# Patient Record
Sex: Female | Born: 1935 | Race: White | Hispanic: No | Smoking: Former smoker
Health system: Southern US, Community
[De-identification: ages and names within clinical notes are randomized; demographics above are authoritative.]

## PROBLEM LIST (undated history)

## (undated) DIAGNOSIS — E559 Vitamin D deficiency, unspecified: Secondary | ICD-10-CM

## (undated) DIAGNOSIS — D4959 Neoplasm of unspecified behavior of other genitourinary organ: Secondary | ICD-10-CM

## (undated) DIAGNOSIS — R112 Nausea with vomiting, unspecified: Secondary | ICD-10-CM

## (undated) DIAGNOSIS — Z9889 Other specified postprocedural states: Secondary | ICD-10-CM

## (undated) DIAGNOSIS — T4145XA Adverse effect of unspecified anesthetic, initial encounter: Secondary | ICD-10-CM

## (undated) DIAGNOSIS — M810 Age-related osteoporosis without current pathological fracture: Secondary | ICD-10-CM

## (undated) DIAGNOSIS — D493 Neoplasm of unspecified behavior of breast: Secondary | ICD-10-CM

## (undated) DIAGNOSIS — T8859XA Other complications of anesthesia, initial encounter: Secondary | ICD-10-CM

## (undated) DIAGNOSIS — I219 Acute myocardial infarction, unspecified: Secondary | ICD-10-CM

## (undated) DIAGNOSIS — D491 Neoplasm of unspecified behavior of respiratory system: Secondary | ICD-10-CM

## (undated) DIAGNOSIS — I2510 Atherosclerotic heart disease of native coronary artery without angina pectoris: Secondary | ICD-10-CM

## (undated) DIAGNOSIS — I10 Essential (primary) hypertension: Secondary | ICD-10-CM

## (undated) DIAGNOSIS — E785 Hyperlipidemia, unspecified: Secondary | ICD-10-CM

## (undated) HISTORY — PX: HIP PINNING: SHX1757

## (undated) HISTORY — DX: Vitamin D deficiency, unspecified: E55.9

## (undated) HISTORY — DX: Hyperlipidemia, unspecified: E78.5

## (undated) HISTORY — DX: Hypercalcemia: E83.52

## (undated) HISTORY — DX: Atherosclerotic heart disease of native coronary artery without angina pectoris: I25.10

## (undated) HISTORY — PX: CHOLECYSTECTOMY: SHX55

## (undated) HISTORY — PX: EYE SURGERY: SHX253

## (undated) HISTORY — PX: ANGIOPLASTY: SHX39

## (undated) HISTORY — DX: Essential (primary) hypertension: I10

## (undated) HISTORY — PX: CARDIAC CATHETERIZATION: SHX172

## (undated) HISTORY — PX: BREAST LUMPECTOMY: SHX2

## (undated) HISTORY — DX: Neoplasm of unspecified behavior of breast: D49.3

## (undated) HISTORY — DX: Acute myocardial infarction, unspecified: I21.9

## (undated) HISTORY — DX: Neoplasm of unspecified behavior of other genitourinary organ: D49.59

## (undated) HISTORY — DX: Age-related osteoporosis without current pathological fracture: M81.0

## (undated) HISTORY — PX: ABDOMINAL HYSTERECTOMY: SHX81

---

## 1993-12-25 DIAGNOSIS — I219 Acute myocardial infarction, unspecified: Secondary | ICD-10-CM

## 1993-12-25 HISTORY — DX: Acute myocardial infarction, unspecified: I21.9

## 1997-11-04 ENCOUNTER — Other Ambulatory Visit: Admission: RE | Admit: 1997-11-04 | Discharge: 1997-11-04 | Payer: Self-pay | Admitting: Obstetrics and Gynecology

## 1997-11-18 ENCOUNTER — Ambulatory Visit (HOSPITAL_COMMUNITY): Admission: RE | Admit: 1997-11-18 | Discharge: 1997-11-18 | Payer: Self-pay | Admitting: Obstetrics and Gynecology

## 1997-11-20 ENCOUNTER — Ambulatory Visit (HOSPITAL_COMMUNITY): Admission: RE | Admit: 1997-11-20 | Discharge: 1997-11-20 | Payer: Self-pay | Admitting: Obstetrics and Gynecology

## 1998-11-05 ENCOUNTER — Other Ambulatory Visit: Admission: RE | Admit: 1998-11-05 | Discharge: 1998-11-05 | Payer: Self-pay | Admitting: Obstetrics and Gynecology

## 1998-11-24 ENCOUNTER — Ambulatory Visit (HOSPITAL_COMMUNITY): Admission: RE | Admit: 1998-11-24 | Discharge: 1998-11-24 | Payer: Self-pay | Admitting: Obstetrics and Gynecology

## 1998-11-24 ENCOUNTER — Encounter: Payer: Self-pay | Admitting: Obstetrics and Gynecology

## 1998-12-31 ENCOUNTER — Inpatient Hospital Stay (HOSPITAL_COMMUNITY): Admission: EM | Admit: 1998-12-31 | Discharge: 1999-01-03 | Payer: Self-pay | Admitting: *Deleted

## 1999-01-01 ENCOUNTER — Encounter: Payer: Self-pay | Admitting: Neurology

## 1999-04-15 ENCOUNTER — Encounter: Payer: Self-pay | Admitting: Neurology

## 1999-04-15 ENCOUNTER — Ambulatory Visit (HOSPITAL_COMMUNITY): Admission: RE | Admit: 1999-04-15 | Discharge: 1999-04-15 | Payer: Self-pay | Admitting: Neurology

## 1999-11-30 ENCOUNTER — Encounter: Payer: Self-pay | Admitting: Obstetrics and Gynecology

## 1999-11-30 ENCOUNTER — Ambulatory Visit (HOSPITAL_COMMUNITY): Admission: RE | Admit: 1999-11-30 | Discharge: 1999-11-30 | Payer: Self-pay | Admitting: Obstetrics and Gynecology

## 1999-12-22 ENCOUNTER — Other Ambulatory Visit: Admission: RE | Admit: 1999-12-22 | Discharge: 1999-12-22 | Payer: Self-pay | Admitting: Obstetrics and Gynecology

## 2000-03-03 ENCOUNTER — Ambulatory Visit (HOSPITAL_COMMUNITY): Admission: RE | Admit: 2000-03-03 | Discharge: 2000-03-03 | Payer: Self-pay | Admitting: Gastroenterology

## 2001-02-22 ENCOUNTER — Encounter (INDEPENDENT_AMBULATORY_CARE_PROVIDER_SITE_OTHER): Payer: Self-pay

## 2001-02-22 ENCOUNTER — Ambulatory Visit (HOSPITAL_COMMUNITY): Admission: RE | Admit: 2001-02-22 | Discharge: 2001-02-22 | Payer: Self-pay | Admitting: Gastroenterology

## 2001-02-23 ENCOUNTER — Ambulatory Visit (HOSPITAL_COMMUNITY): Admission: RE | Admit: 2001-02-23 | Discharge: 2001-02-23 | Payer: Self-pay | Admitting: Gastroenterology

## 2001-02-23 ENCOUNTER — Encounter: Payer: Self-pay | Admitting: Gastroenterology

## 2001-10-24 ENCOUNTER — Ambulatory Visit (HOSPITAL_COMMUNITY): Admission: RE | Admit: 2001-10-24 | Discharge: 2001-10-24 | Payer: Self-pay | Admitting: Gastroenterology

## 2002-03-04 ENCOUNTER — Other Ambulatory Visit: Admission: RE | Admit: 2002-03-04 | Discharge: 2002-03-04 | Payer: Self-pay | Admitting: Obstetrics and Gynecology

## 2002-03-22 ENCOUNTER — Ambulatory Visit (HOSPITAL_COMMUNITY): Admission: RE | Admit: 2002-03-22 | Discharge: 2002-03-22 | Payer: Self-pay | Admitting: Obstetrics and Gynecology

## 2002-03-22 ENCOUNTER — Encounter: Payer: Self-pay | Admitting: Obstetrics and Gynecology

## 2002-04-27 HISTORY — PX: FRACTURE SURGERY: SHX138

## 2002-05-14 ENCOUNTER — Encounter: Payer: Self-pay | Admitting: Orthopedic Surgery

## 2002-05-14 ENCOUNTER — Encounter: Admission: RE | Admit: 2002-05-14 | Discharge: 2002-05-14 | Payer: Self-pay | Admitting: Orthopedic Surgery

## 2002-05-15 ENCOUNTER — Ambulatory Visit (HOSPITAL_BASED_OUTPATIENT_CLINIC_OR_DEPARTMENT_OTHER): Admission: RE | Admit: 2002-05-15 | Discharge: 2002-05-15 | Payer: Self-pay | Admitting: Orthopedic Surgery

## 2003-06-06 ENCOUNTER — Ambulatory Visit (HOSPITAL_COMMUNITY): Admission: RE | Admit: 2003-06-06 | Discharge: 2003-06-06 | Payer: Self-pay | Admitting: Obstetrics and Gynecology

## 2005-09-02 ENCOUNTER — Ambulatory Visit (HOSPITAL_COMMUNITY): Admission: RE | Admit: 2005-09-02 | Discharge: 2005-09-02 | Payer: Self-pay | Admitting: Obstetrics and Gynecology

## 2005-10-05 ENCOUNTER — Encounter: Admission: RE | Admit: 2005-10-05 | Discharge: 2005-10-05 | Payer: Self-pay | Admitting: Obstetrics and Gynecology

## 2005-10-13 ENCOUNTER — Other Ambulatory Visit: Admission: RE | Admit: 2005-10-13 | Discharge: 2005-10-13 | Payer: Self-pay | Admitting: Obstetrics and Gynecology

## 2005-11-24 ENCOUNTER — Encounter: Admission: RE | Admit: 2005-11-24 | Discharge: 2005-11-24 | Payer: Self-pay | Admitting: Obstetrics and Gynecology

## 2006-06-26 ENCOUNTER — Inpatient Hospital Stay (HOSPITAL_COMMUNITY): Admission: EM | Admit: 2006-06-26 | Discharge: 2006-06-30 | Payer: Self-pay | Admitting: Emergency Medicine

## 2006-06-30 ENCOUNTER — Ambulatory Visit: Payer: Self-pay | Admitting: Physical Medicine & Rehabilitation

## 2008-06-27 HISTORY — PX: DOPPLER ECHOCARDIOGRAPHY: SHX263

## 2008-11-29 ENCOUNTER — Inpatient Hospital Stay (HOSPITAL_COMMUNITY): Admission: EM | Admit: 2008-11-29 | Discharge: 2008-12-03 | Payer: Self-pay | Admitting: Emergency Medicine

## 2009-01-30 ENCOUNTER — Encounter: Admission: RE | Admit: 2009-01-30 | Discharge: 2009-01-30 | Payer: Self-pay | Admitting: Internal Medicine

## 2010-02-01 ENCOUNTER — Encounter: Admission: RE | Admit: 2010-02-01 | Discharge: 2010-02-01 | Payer: Self-pay | Admitting: Internal Medicine

## 2010-07-18 ENCOUNTER — Encounter: Payer: Self-pay | Admitting: Internal Medicine

## 2010-10-04 LAB — BASIC METABOLIC PANEL
BUN: 15 mg/dL (ref 6–23)
BUN: 7 mg/dL (ref 6–23)
CO2: 23 mEq/L (ref 19–32)
CO2: 29 mEq/L (ref 19–32)
CO2: 30 mEq/L (ref 19–32)
Calcium: 9.1 mg/dL (ref 8.4–10.5)
Calcium: 9.2 mg/dL (ref 8.4–10.5)
Calcium: 9.9 mg/dL (ref 8.4–10.5)
Chloride: 107 mEq/L (ref 96–112)
Chloride: 107 mEq/L (ref 96–112)
Chloride: 108 mEq/L (ref 96–112)
Creatinine, Ser: 0.82 mg/dL (ref 0.4–1.2)
Creatinine, Ser: 0.87 mg/dL (ref 0.4–1.2)
Creatinine, Ser: 0.9 mg/dL (ref 0.4–1.2)
Creatinine, Ser: 0.93 mg/dL (ref 0.4–1.2)
GFR calc Af Amer: >60 mL/min (ref >60)
GFR calc Af Amer: >60 mL/min (ref >60)
GFR calc Af Amer: >60 mL/min (ref >60)
GFR calc Af Amer: >60 mL/min (ref >60)
GFR calc non Af Amer: >60 mL/min (ref >60)
GFR calc non Af Amer: >60 mL/min (ref >60)
Glucose, Bld: 100 mg/dL — ABNORMAL HIGH (ref 70–99)
Glucose, Bld: 96 mg/dL (ref 70–99)
Potassium: 4 mEq/L (ref 3.5–5.1)
Sodium: 137 mEq/L (ref 135–145)
Sodium: 140 mEq/L (ref 135–145)
Sodium: 141 mEq/L (ref 135–145)

## 2010-10-04 LAB — DIFFERENTIAL
Basophils Absolute: 0 10*3/uL (ref 0.0–0.1)
Basophils Relative: 0 % (ref 0–1)
Eosinophils Absolute: 0 10*3/uL (ref 0.0–0.7)
Eosinophils Relative: 0 % (ref 0–5)
Lymphocytes Relative: 9 % — ABNORMAL LOW (ref 12–46)
Lymphs Abs: 1.1 10*3/uL (ref 0.7–4.0)
Monocytes Absolute: 0.4 10*3/uL (ref 0.1–1.0)
Monocytes Relative: 3 % (ref 3–12)
Neutro Abs: 10.3 10*3/uL — ABNORMAL HIGH (ref 1.7–7.7)
Neutrophils Relative %: 87 % — ABNORMAL HIGH (ref 43–77)

## 2010-10-04 LAB — URINALYSIS, ROUTINE W REFLEX MICROSCOPIC
Glucose, UA: NEGATIVE mg/dL
Ketones, ur: NEGATIVE mg/dL
Protein, ur: 30 mg/dL — AB
Urobilinogen, UA: 0.2 mg/dL (ref 0.0–1.0)

## 2010-10-04 LAB — CBC
HCT: 42.9 % (ref 36.0–46.0)
Hemoglobin: 10.2 g/dL — ABNORMAL LOW (ref 12.0–15.0)
Hemoglobin: 10.4 g/dL — ABNORMAL LOW (ref 12.0–15.0)
Hemoglobin: 13.9 g/dL (ref 12.0–15.0)
MCHC: 32.3 g/dL (ref 30.0–36.0)
MCHC: 33.6 g/dL (ref 30.0–36.0)
MCHC: 33.7 g/dL (ref 30.0–36.0)
MCV: 83 fL (ref 78.0–100.0)
MCV: 83 fL (ref 78.0–100.0)
MCV: 83.4 fL (ref 78.0–100.0)
Platelets: 176 10*3/uL (ref 150–400)
Platelets: 246 10*3/uL (ref 150–400)
RBC: 3.66 MIL/uL — ABNORMAL LOW (ref 3.87–5.11)
RBC: 3.71 MIL/uL — ABNORMAL LOW (ref 3.87–5.11)
RBC: 4.13 MIL/uL (ref 3.87–5.11)
RBC: 5.16 MIL/uL — ABNORMAL HIGH (ref 3.87–5.11)
RDW: 16.7 % — ABNORMAL HIGH (ref 11.5–15.5)
RDW: 17.1 % — ABNORMAL HIGH (ref 11.5–15.5)
WBC: 11.8 10*3/uL — ABNORMAL HIGH (ref 4.0–10.5)
WBC: 7.9 10*3/uL (ref 4.0–10.5)
WBC: 8.7 10*3/uL (ref 4.0–10.5)

## 2010-10-04 LAB — URINE MICROSCOPIC-ADD ON

## 2010-10-04 LAB — ETHANOL

## 2010-10-04 LAB — RAPID URINE DRUG SCREEN, HOSP PERFORMED
Barbiturates: NOT DETECTED
Benzodiazepines: NOT DETECTED
Cocaine: NOT DETECTED

## 2010-11-09 NOTE — Op Note (Signed)
[name], [name]            ACCOUNT NO.:  [id number]   MEDICAL RECORD NO.:  [id number]          PATIENT TYPE:  INP   LOCATION:  1530                         FACILITY:  [facility]   PHYSICIAN:  Lubertha Basque. Dalldorf, M.D.DATE OF BIRTH:  1934/09/08   DATE OF PROCEDURE:  11/30/2008  DATE OF DISCHARGE:                               OPERATIVE REPORT   PREOPERATIVE DIAGNOSIS:  Left distal femur fracture.   POSTOPERATIVE DIAGNOSIS:  Left distal femur fracture.   PROCEDURE:  Open reduction internal fixation, left distal femur  fracture.   ANESTHESIA:  General.   ATTENDING SURGEON:  Lubertha Basque. Jerl Santos, M.D.   ASSISTANT:  Lindwood Qua, P.A.   INDICATIONS FOR PROCEDURE:  The patient is a 76 year old woman well-  known to our practice from old fractures.  Unfortunately she was at home  yesterday when she slipped when trying to climb and landed on her left  leg.  She suffered a displaced distal femur fracture.  She is offered  ORIF.  Informed operative consent was obtained after discussion of  possible complications including reaction to anesthesia, infection, DVT,  and death.  The probability of arthritis due to this intra-articular  fracture was also discussed with the patient.   SUMMARY OF FINDINGS AND PROCEDURE:  Under general anesthesia through a  lateral approach, a left distal femur fracture was reduced and then  stabilized with a POLYAX plate by DePuy.  I used fluoroscopy throughout  the case to make appropriate intraoperative decisions read all these  views myself.  Bryna Colander assisted throughout mostly in that he  helped maintain reduction while I placed the hardware.  He also closed  simultaneously to help minimize OR time.   DESCRIPTION OF PROCEDURE:  The patient was taken to the operating suite  where general anesthetic applied without difficulty.  She was positioned  supine and prepped and draped in normal sterile fashion.  After the  administration of IV Kefzol, the  left leg was elevated, exsanguinated,  tourniquet inflated out the thigh.  An anterolateral incision was made  with dissection down through the IT band.  A large hemarthrosis was  evacuated from the knee.  I then reduced her lateral condyle piece to  the rest of the femur with some elevators and Bryna Colander passed a K-  wire securing this in place as temporary fixation.  We then placed the  six-hole POLYAX  plate appropriately.  This was provisionally fixed to  the bone with another K-wire and a proximal locking device.  We then  placed 5 screws in the distal portion, the first of which was a  compression screw and the rest of which were locking.  We then filled 4  proximal screws with the attached guide, placing 3 nonlocking and 1  locking screw.  I used fluoroscopy to confirm adequate placement of  hardware and reduction of fractures.  The wound was irrigated followed  by release of the tourniquet.  A small amount of bleeding was easily  controlled some pressure and Bovie cautery.  IT band was reapproximated  with #1 Vicryl in interrupted fashion followed by subcutaneous  reapproximation with 0 and 2-0 undyed Vicryl and skin closure with  staples.  Adaptic was applied followed by dry gauze and loose Ace wrap.  Estimated blood loss and fluids obtained from anesthesia records as can  accurate tourniquet time.   DISPOSITION:  The patient was extubated in the operating room and taken  to recovery in stable addition.  She was be admitted back to the  orthopedic surgery service for appropriate postop care to include  perioperative antibiotics and Lovenox for DVT prophylaxis.      Lubertha Basque Jerl Santos, M.D.  Electronically Signed     PGD/MEDQ  D:  11/30/2008  T:  12/01/2008  Job:  [id number]

## 2010-11-09 NOTE — Discharge Summary (Signed)
[name], [name]            ACCOUNT NO.:  [id number]   MEDICAL RECORD NO.:  [id number]          PATIENT TYPE:  INP   LOCATION:  1614                         FACILITY:  [facility]   PHYSICIAN:  Lubertha Basque. Dalldorf, M.D.DATE OF BIRTH:  1934-11-06   DATE OF ADMISSION:  11/29/2008  DATE OF DISCHARGE:  12/03/2008                               DISCHARGE SUMMARY   ADMITTING DIAGNOSES:  1. Left distal femur fracture.  2. Hypercholesterolemia.  3. Hypertension.  4. Osteoporosis.   DISCHARGE DIAGNOSES:  1. Left distal femur fracture.  2. Hypercholesterolemia.  3. Hypertension.  4. Osteoporosis.  5. Psychosis.   BRIEF HISTORY:  [name] is a patient well-known to our practice.  She is a 76 year old white female who the day of admission to the  hospital was on a stepstool attempting to pull down the ladder to her  attic.  She lost her balance and fell and was unable to stand and walk  and was transported to [facility] emergency room at which time  x-rays were taken and she had a comminuted left distal femur fracture.  We consulted on the patient and we told her that the best treatment for  this was operative - open reduction internal fixation and we would  proceed on with that as soon as OR time was available.   PERTINENT LABORATORY AND X-RAY FINDINGS:  Hemoglobin 10.2, hematocrit  30.3, WBC 6.2, platelets 175.  Sodium 141, potassium 4.1, BUN 10,  creatinine 0.87, glucose 100, calcium 9.2.  X-rays, there was a brain  MRI scan done because the patient had had episodes of hearing voices and  delusions or hallucinations and this was performed while she was in the  hospital and the impression was a vascular benign lesion in the fourth  ventricle essentially unchanged from a previous MRI scan.  Left knee x-  rays showed ORIF of left distal femur fracture.  Portable chest no  active disease, some suggestion of COPD.  Initial knee x-rays distal  femur fracture with lateral  condyle as a free fragment.   HOSPITAL COURSE:  The patient was admitted from the emergency room,  stabilized and made comfortable on the floor.  The next morning she was  taken to the operating room for the above mentioned operation of open  reduction internal fixation of her left femur.  Postoperatively she was  kept on her home medicines which will be outlined at the end of the  dictation; IV Ancef a gram q.8 h x3 doses.  Foley catheter as needed and  then discontinued.  IV Ancef a gram q.8 h x3 doses and she was kept on  Lovenox for DVT prophylaxis but because of fall risk it was thought that  she would not be a good Coumadin long-term anticoagulation candidate.  The first day postop her vital signs were stable.  Blood pressure  136/73, heart rate 79, respirations 16, temperature 98.5.  Lungs were  clear.  Abdomen was soft.  Foley catheter was discontinued.  No dressing  drainage.  Normal neurovascular status to her lower extremities both  sides.  Physical therapy was ordered  for out of bed and could be just  touchdown to nonweightbearing with a brace on her left lower extremity.  The second day postop her vital signs remained stable.  Her dressing was  changed.  Her wound was benign.  Lungs were clear.  Abdomen was soft.  Voiding and eating well.  Dr. Jeanie Sewer performed a psych consult for [place]  and made some suggestions for this nice patient.  It was felt that she  could not live alone at home initially in the postoperative phase and  thought it would be best to find her a skilled nursing facility.   CONDITION ON DISCHARGE:  Improved.   FOLLOWUP:  She will remain on a low-sodium and heart-healthy diet.  May  change the dressing on her leg every other day.  Any sign of infection  which would be increasing redness, drainage or pain to call our office.  She could ambulate with assistance from physical therapy with the knee  brace on and no weight on her left lower extremity using a  walker,  crutches or wheelchair.  We wanted to see her back in our office at 2  weeks from the surgery for suture removal.  She will keep the brace on  in the meantime and take it off for slight bouts if she is in bed to  move her ankle and calf around but if she is up and moving at all the  knee brace needs to be on.  Physical therapy would work with her and  train her gait training for touchdown to nonweightbearing on the left  side.  She will be on a regular diet.   MEDICATION LIST:  1. Will contain Claritin 10 mg one a day.  2. Colace 100 mg b.i.d.  3. ASA 81 mg one per day.  4. Multivitamin with minerals one a day.  5. Os-Cal 500 with vitamin D 2 pills per day.  6. Pravachol 40 mg one a day.  7. Senokot tablets as needed.  8. Seroquel 25 mg p.o. q.1800 hours.  9. Toprol XL 75 mg one a day.  10.Trinsicon Foltrin cap which is an iron capsule 1 pill b.i.d.  11.Robaxin 500 one p.o. q.8 p.r.n. spasm.  12.Tylenol 325 one q.4-6 p.r.n. temperature elevation or mild pain.      Lindwood Qua, P.A.      Lubertha Basque Jerl Santos, M.D.  Electronically Signed    MC/MEDQ  D:  12/03/2008  T:  12/03/2008  Job:  [id number]

## 2010-11-09 NOTE — Consult Note (Signed)
[name]            ACCOUNT NO.:  [id number]   MEDICAL RECORD NO.:  [id number]          PATIENT TYPE:  INP   LOCATION:  1614                         FACILITY:  [facility]   PHYSICIAN:  Antonietta Breach, M.D.  DATE OF BIRTH:  07/13/1934   DATE OF CONSULTATION:  12/03/2008  DATE OF DISCHARGE:                                 CONSULTATION   REASON FOR CONSULTATION:  Psychosis.   HISTORY OF PRESENT ILLNESS:  Ms. [name] is a 76 year old  female admitted to the [facility] on November 29, 2008 due to a  fracture of the distal left femur.  She is postoperative.   She has been experiencing delusions for approximately 1 year that  involve people in her attic who are not actually there.  She believes  that they are coming down to her bedroom at night and disturbing her.  She also believes that they have sabotaged her burglary detector system.  She has called 9-1-1 multiple times.  The police have contacted Ms.  [name]'s daughter to explain that there is no data or evidence that  any of these events are occurring or that there are people in her attic.  In addition to the delusions she does have auditory hallucinations of  their cell phones going off and of them coughing.   She does have much feeling on edge and muscle tension as well as  insomnia with these delusions.  However, she does have intact interests  and constructive future goals.   Her orientation and memory function are intact.  She does not have any  clouding of consciousness or disorganization of thought process.   She has not undergone any treatment for her anxiety or psychosis.   PAST PSYCHIATRIC HISTORY:  She does not have any history of prior  psychosis.  However, she does have a history of excessive worry and  feeling on edge.   FAMILY PSYCHIATRIC HISTORY:  None known.   SOCIAL HISTORY:  Ms. [name] is chronically stressed by a riff in the  family that occurred between her and her brother  over an inheritance  that the patient received.   The patient is originally from near [place], [place].  She has  one sibling.  She is now widowed and lives alone.  She does not use  alcohol or illegal drugs.  Occupation retired.  She has one child, a  daughter who lives across town but looks in on her frequently.  Her  daughter has been contacted by the police multiple times regarding the  patient's phone calls that have been secondary to the patient's  delusional system.   The daughter has made a number of efforts to get Ms. [name] evaluated  psychiatrically that have failed.   PAST MEDICAL HISTORY:  Fracture of left distal femur status post  surgery.  She fell from a step stool.   ALLERGIES:  CODEINE AND SULFA.  She does not take any outpatient  psychotropic medications.   She does have a benign lesion in the brain stem located in the region of  the fourth ventricle.  This has been assessed to  be a possible vascular  malformation or a benign tumor of some nature.  It is being monitored  with an MRI recently showing the lesion.   She does have a history of osteoporosis, hypercholesterolemia,  hypertension.  She also has suffered from a left humerus fracture and  left fibular fracture and a left ankle fracture in the past.   LABORATORY DATA:  Hemoglobin 10.2.  EKG; QTC 420 milliseconds.   INR, alcohol, basic metabolic panel, urine drug screen all unremarkable.   REVIEW OF SYSTEMS:  CONSTITUTIONAL:  HEAD, EYES, EARS, NOSE AND THROAT:  MOUTH, NEUROLOGIC, PSYCHIATRIC, CARDIOVASCULAR, RESPIRATORY,  GASTROINTESTINAL, GENITOURINARY, SKIN, MUSCULOSKELETAL, HEMATOLOGIC,  LYMPHATIC, ENDOCRINE, METABOLIC:  All unremarkable.   PHYSICAL EXAMINATION:  VITAL SIGNS:  Temperature 98.1, pulse 84,  respiratory rate 20, blood pressure 166/79, O2 saturation on room air  92%.  GENERAL APPEARANCE:  Ms. [name] is an elderly female sitting up in  her hospital chair with no abnormal  involuntary movements.   MENTAL STATUS EXAM:  Ms. [name] is alert.  Her eye contact is good.  Her attention span is normal.  Her concentration is normal, affect is  anxious.  Her mood is anxious.  She is oriented completely to all  spheres.  Her memory function is intact to immediate, recent and remote.  Specific memory testing:  3/3 words immediate, 3/3 words at 3 minutes  without prompting.  Fund of knowledge and intelligence within normal  limits.  Speech involves normal rate and prosody without dysarthria.  Thought process is logical, coherent, goal-directed.  No looseness of  associations.  Thought content; please see the history of present  illness.  She has no thoughts of harming herself or others.  There is no  evidence that she is having delusions regarding her environment in the  hospital or hallucinations while in the hospital.  Insight is poor.  Judgment is impaired regarding her delusions.  However, her judgment  appears to be intact regarding her ability to take care of her ADLs.   ASSESSMENT:  AXIS I:  293.81 psychotic disorder not otherwise specified.  AXIS II:  None.  AXIS III:  See past medical history.  AXIS IV:  General medical.  AXIS V:  45.   The undersigned provided ego supportive psychotherapy and education.   The undersigned also met individually with the patient's daughter.  The  indications, alternatives and adverse effects of Seroquel were discussed  including the risk of cardiac death, a nonreversible movement disorder,  hyperglycemia, metabolic syndrome and falls.  She understands and wants  to proceed with the Seroquel.   Would start the Seroquel at 25 mg q.1800 with 25 mg available q.h.s.  p.r.n. insomnia.  Would then titrate the Seroquel by 25 mg per day as  tolerated to a trial dose anywhere between 50 and 150 mg q.h.s.   RECOMMENDATIONS:  Ms. [name] is going to need 24-hour per day  supervision given that the delusions and hallucinations  could result in  activities of a passive and yet lethal self-neglect.   This environment could be provided by an inpatient geropsychiatric unit  or if her daughter was able to be with her while the patient was  followed up intensively by an outpatient physician.   The latter situation would allow the patient to be more willing to  proceed with medication care since she is resistant towards seeing  mental health providers.   Would ask the social worker to facilitate setting up one of the two  discharge plans above.   The anticipated time for efficacy of the antipsychotic would be 1-3  weeks.   If the geropsychiatric inpatient unit is not acceptable to Ms. Hilton  and an outpatient environment of 24-hour per day supervision can be  obtained, ideally psychiatric outpatient follow-up would be the best.   This can also be arranged through the social worker however, as  mentioned Ms. Harmon is likely to turn this down and would be more  likely to see her primary care physician or orthopedic surgeon.   If no outpatient supervision can be obtained and Ms. Ehrich refuses  inpatient admission, she does meet the criteria for petitioning the  court for commitment due to the risk of passive and yet lethal self-  neglect due to impaired judgment.  As mentioned, she can take care of  her ADLs.  However, the psychosis could motivate her into activity that  could be dangerous.      Antonietta Breach, M.D.  Electronically Signed     JW/MEDQ  D:  12/03/2008  T:  12/03/2008  Job:  [id number]

## 2010-11-12 NOTE — Op Note (Signed)
[name], COUNTESS            ACCOUNT NO.:  [id number]   MEDICAL RECORD NO.:  [id number]          PATIENT TYPE:  INP   LOCATION:  5016                         FACILITY:  [facility]   PHYSICIAN:  Lubertha Basque. Dalldorf, M.D.DATE OF BIRTH:  Jun 20, 1935   DATE OF PROCEDURE:  06/26/2006  DATE OF DISCHARGE:                               OPERATIVE REPORT   PREOPERATIVE DIAGNOSES:  1. Left hip nondisplaced femoral neck fracture.  2. Left ankle distal fibula and tibia fractures.   POSTOPERATIVE DIAGNOSES:  1. Left hip nondisplaced femoral neck fracture.  2. Left ankle distal fibula and tibia fractures.   PROCEDURE:  1. Left hip percutaneous pinning.  2. Left ankle ORIF.   ANESTHESIA:  General.   ATTENDING SURGEON:  Marcene Corning, M.D.   ASSISTANT:  Lindwood Qua, P.A.   INDICATIONS FOR PROCEDURE:  The patient is a 76 year old woman who fell  down the steps of her home earlier today.  She was noted to have left-  sided injuries including a fracture of her shoulder, hip, knee, and  ankle.  The hip and ankle injuries required operative intervention.  She  is offered internal fixation in hopes of stabilizing these injuries so  she can at least sit and get out of bed and potentially ambulate once  again.  Informed operative consent was obtained after a discussion of  possible complications including reaction to anesthesia, infection,  neurovascular injury, and DVT.  She also understood about the serious  nature of the hip fracture in particular where she might end up with  another operation if we cannot get this fracture to heal despite  adequate pinning.  She also understood at the ankle she has a fairly  severe intra-articular problem which might require further surgery,  potentially even an ankle fusion in the future.   SUMMARY OF FINDINGS OF PROCEDURE:  Under general anesthesia several  procedures were performed.  I first performed an examination of her  shoulder under fluoroscopy  and found that her fracture there was stable  and did not require any further intervention.  I then addressed her hip  with a percutaneous pinning using two Ace screws.  One screw was 85 mm  in length and the other was 90 and these were from the DePuy set.  At  the ankle we then addressed her problem with a plate on the fibula  bringing the entire construct out to appropriate length.  This was a  locking plate and she had fair bone quality.  I did place one of the  screws all the way across to the medial aspect of the tibia.  This  seemed to stabilize the distal articular surface of the tibia.  Bryna Colander assisted throughout on both procedures and was invaluable to  completion of both cases in that he helped position and retract while I  performed the procedures.  He also closed simultaneously to help  minimize OR time.  I used fluoroscopy throughout all three portions of  this case and utilized fluoroscopy to make appropriate intraop  decisions.  A read all the views myself.   DESCRIPTION OF  PROCEDURE:  The patient was taken to the operating suite  where a general anesthetic was applied without difficulty.  She was  initially positioned in the lateral decubitus position with hip  positioners.  An axillary roll was placed and all bony prominences were  appropriately padded.  I then examined her shoulder under fluoroscopy  and this was found to be in place and moving as a the unit.  We then  prepped and draped the hip in normal sterile fashion.  After the  administration of IV Kefzol I performed some triangulation with the  fluoroscopy and made a small incision, followed by placement of two  guide wires up from the flare of the femur into the femoral head.  These  were seen to be in good position by fluoroscopy in two planes.  I then  passed an 85 and 90-mm cannulated screw by DePuy over these guidewires.  The hip was examined fluoroscopically and the pins were found to be in   appropriate position.  This remained a nondisplaced fracture throughout.  We then closed the subcutaneous tissues with 2-0 undyed Vicryl and the  skin with staples.  Adaptic was applied followed by dry gauze and tape.  We then reprepped and draped and I placed the patient in supine position  with a bump under the left hip.  The left leg was elevated,  exsanguinated, and a tourniquet inflated about the calf.  A lateral  incision was made with dissection down to the fibula.  This was brought  out to length and this also seemed to reduce the distal articular  surface of the tibia in a fairly good fashion.  I then stabilized the  fibula with a Synthes locking plate seven hole one-third tubular.  This  was bent appropriately to accommodate the shape of the distal fibula.  I  left the most proximal screw hole empty.  She had fair bone quality.  In  one of the central screws I was able to place a long fully threaded  cortical screw across to the far cortex of the tibia on the medial  aspect of the ankle.  This seemed to buttress the distal articular  surface well.  I examined the ankle under fluoroscopy and the construct  was found to be fairly stable.  The wound was irrigated followed by  reapproximation of the deep tissues with 2-0 undyed Vicryl and the skin  with staples.  Adaptic was applied followed by dry gauze.  The  tourniquet was deflated during closure and the toes became pink and warm  immediately.  A posterior splint of plaster was applied with the ankle  in neutral position.  Estimated blood loss and intraoperative fluids can  be obtained from the anesthesia records as can accurate tourniquet time.   DISPOSITION:  The patient was extubated in the operating room and taken  to the recovery room in stable addition.  She was to be admitted back to  the orthopedic surgery service for appropriate postop care to include perioperative antibiotics and Coumadin for DVT prophylaxis.       Lubertha Basque Jerl Santos, M.D.  Electronically Signed     PGD/MEDQ  D:  06/26/2006  T:  06/27/2006  Job:  [id number]

## 2010-11-12 NOTE — Procedures (Signed)
[facility]  Patient:    [name], [name]                    MRN: [id number] Proc. Date: 03/03/00 Adm. Date:  [id number] Disc. Date: [id number] Attending:  Deneen Harts CC:         Beather Arbour. Thomasena Edis, M.D.   Procedure Report  PROCEDURE:  Colonoscopic polypectomy.  SURGEON:  Griffith Citron, M.D.  INDICATIONS:  A 76 year old white female, referred through the courtesy of Dr. Artist Pais for colorectal neoplasia surveillance.  Found to have Hemoccult positive stool on routine screening, otherwise asymptomatic.  DESCRIPTION OF PROCEDURE:  After reviewing the nature of the procedure with the patient including potential risks and complications, and after discussing alternative methods of diagnosis and treatment, informed consent was signed.  The patient was premedicated reviewing IV sedation totalling Versed 6 mg and fentanyl 75 mcg administered IV in divided doses prior to and during the course of the procedure.  Using an Olympus pediatric PCF-140L video colonoscope the rectum was intubated after a normal digital examination revealing no evidence of perianal or intrarectal pathology other than external hemorrhoids.  The scope was inserted and advanced around the entire length of the colon to the cecum, identified by the appendiceal orifice and the ileocecal valve. Preparation was excellent throughout.  The scope was slowly withdrawn with careful inspection of the entire colon in a retrograde manner including retroflexed view in the rectal vault.  A diminutive polyp at 18 cm, measuring approximately 5 mm was resected using hot biopsy forceps, recovered, and submitted to pathology.  No additional neoplasia identified.  Retroflexed view in the rectal vault revealed early, non-inflamed internal hemorrhoids.  The colon was decompressed and the scope withdrawn.  The patient tolerated the procedure without difficulty being maintained on datascope  monitor and low flow oxygen throughout.  Time 2, technical 2, preparation 1, and total score equalled 5.  ASSESSMENT: 1. Rectal polyp - diminutive, resected, pathology pending. 2. Internal and external hemorrhoids - probable source of Hemoccult positive    stool.  RECOMMENDATIONS: 1. Post polypectomy instructions reviewed. 2. Followup pathology. 3. Repeat colonoscopy in three years if adenoma, 10 years if hyperplastic. 4. Anorectal care - Anusol HC suppository one PR b.i.d. 5-7 days, repeat    p.r.n. DD:  03/03/00 TD:  03/05/00 Job: [id number]

## 2010-11-12 NOTE — Discharge Summary (Signed)
NAMEESMAY, AMSPACHER            ACCOUNT NO.:  [id number]   MEDICAL RECORD NO.:  [id number]          PATIENT TYPE:  INP   LOCATION:  5016                         FACILITY:  [facility]   PHYSICIAN:  Lubertha Basque. Dalldorf, M.D.DATE OF BIRTH:  1935-05-16   DATE OF ADMISSION:  06/26/2006  DATE OF DISCHARGE:                               DISCHARGE SUMMARY   TENTATIVE DATE OF DISCHARGE:  Undetermined, tentatively June 30, 2006.   ADMITTING DIAGNOSES:  1. Left proximal humerus fracture.  2. Left proximal fibular fracture.  3. Left femoral neck fracture.  4. Left ankle fracture.  5. History of hypertension.  6. Hypercholesterolemia.  7. Osteoporosis.   DISCHARGE DIAGNOSES:  1. Left proximal humerus fracture.  2. Left proximal fibular fracture.  3. Left femoral neck fracture.  4. Left ankle fracture.  5. History of hypertension.  6. Hypercholesterolemia.  7. Osteoporosis.   OPERATIONS:  1. Percutaneous pinning left hip fracture.  2. Open reduction internal fixation left ankle fracture.   BRIEF HISTORY:  Ms. Wirz is a 76 year old woman who fell down the  steps of her home earlier on the day of admission to the hospital.  She  was unable to bear weight or stand, and she dragged herself to the  telephone, calling for ambulance assistance which then arrived and  transported her the Emergency Department at [facility].  X-rays, at  that time, showed a displaced bimalleolar left ankle fracture, a left  minimally to nondisplaced femoral neck fracture, left proximal humerus  fracture, and left proximal fibular fracture, nondisplaced.  We  discussed treatments with the patient and the family at that time which  essentially were percutaneous pinning of the femoral neck fracture on  the left side and an open reduction internal fixation of the left ankle  fracture.   PERTINENT LABORATORY AND X-RAY FINDINGS:  Ankle showed a displaced  bimalleolar ankle fracture, comminuted.  Left hip:  A  nondisplaced  femoral neck fracture.  Left shoulder:  A proximal humerus fracture.  Knee, left:  Proximal fibula fracture.  Her blood type was noted to be A  negative with antibody screen negative.  Sodium 136, potassium 4.1, BUN  10, glucose 135, calcium 8.5, WBCs 9.9, RBCs 3.36, WBCs 9.3.  INR was  done serially as she was on low dose Coumadin protocol, last INR 2.1.   Postoperatively, she was placed on a low-dose morphine pump protocol.  She had an IV lactated Ringer's, IV Ancef 1 gram every 8 hours x3 doses,  Coumadin regulated by pharmacy for low dose DVT prophylaxis, Colace,  ferrous sulfate, Percocet for pain, Reglan for nausea, ice and elevation  to her lower extremities and upper extremities that were injured and  also in a sling.  Postoperatively, she was in a short leg plaster splint  on her left lower extremity and physical therapy orders were no weight  on her left arm or no weight on her left leg.  She could basically do  bed to chair transfers and wheelchair mobility.  She was kept on her  home medications, which will be outlined at the end of this discharge  summary.   COURSE IN THE HOSPITAL:  The first day postop, her vital signs were as  follows, blood pressure 135/73, temperature 99, hemoglobin 9.9, INR 1.0.  Foley catheter was in place.  Breath sounds in all lung fields.  Abdomen  was soft.  Shoulder was minimally swollen with mild ecchymosis.  Good  neurovascular status to fingertips, full elbow and full left wrist  motion.  She did have a splint on postoperatively on her left lower  extremity with good neurovascular status to her toes.  Her left hip  wound was normal and she was progressing well.   The second postop, had worked with therapy and gotten out of bed to  chair.  Transfer weight only on her right leg.  Blood pressure remained  normal.  Labs were also normal.  Temperature 99.  Lungs were clear.  Abdomen was soft.  We changed, on the third day, the  dressing on her  left hip, with which a Nu-Gauze and paper-tape dressing was applied and  was normal.  We have also taken off the splint on her left lower  extremity, inspecting her ankle and she had some swelling and mild  ecchymosis, but no sign of infection and then the ortho tech was called  and a CAM walker boot was applied after a new sterile dressing was  applied as well.  We had discussed the placement options with this  patient and her daughter, that being either a skilled nursing facility  or rehab unit, and the case managers were helpful in arranging this for  [place].  Her staples and stitches would remain in for 2 weeks  postoperatively.  She should continue in her sling.  Continue  nonweightbearing on the left side and continue on Coumadin for 3-4 weeks  for DVT prophylaxis.   CONDITION ON DISCHARGE:  Improved.   FOLLOWUP:  She can be given Percocet 1 or 2 every 4-6 hours for pain as  needed.  Coumadin dose per regulation by pharmacy and then INRs to be  drawn per their request as well.  Acetaminophen 1 or 2 every 4-6 hours  for temperature elevations above 100.  I think she should continue with  a knee high TEDs on the right side.   She will remain on:  1. Toprol 50 mg 1 a day.  2. Pravastatin 20 mg 1 a day.  3. Claritin 10 mg 1 a day.  4. Caltrate with vitamin D 1000 mg daily in divided dose.  5. Centrum Silver vitamin.  6. Stool softeners as needed with laxative of choice, enema p.r.n.   May change dressings on her left hip and left ankle about every other  day, but once again she is to be nonweightbearing on the left side, arm  and leg.  Can only do bed to chair, bed to wheelchair transfers.  Diet  is  unrestricted.  Any sign of infection to call our office at [phone].  Follow up with Dr. Jerl Santos in approximately 2 weeks.  I think for her  to also use the incentive spirometer while awake every hour would be a reasonable thing to keep her lungs as clear as  possible.      Lindwood Qua, P.A.      Lubertha Basque Jerl Santos, M.D.  Electronically Signed    MC/MEDQ  D:  06/29/2006  T:  06/29/2006  Job:  [id number]

## 2010-11-12 NOTE — Op Note (Signed)
NAME:  [name], [name]                      ACCOUNT NO.:  [id number]   MEDICAL RECORD NO.:  [id number]                   PATIENT TYPE:  AMB   LOCATION:  DSC                                  FACILITY:  [facility]   PHYSICIAN:  Feliberto Gottron. Turner Daniels, M.D.                DATE OF BIRTH:  May 18, 1935   DATE OF PROCEDURE:  05/15/2002  DATE OF DISCHARGE:                                 OPERATIVE REPORT   PREOPERATIVE DIAGNOSIS:  Comminuted left distal radius fracture with 45 mm  of ulnar positive shortening.   POSTOPERATIVE DIAGNOSIS:  Comminuted left distal radius fracture with 45 mm  of ulnar positive shortening.   PROCEDURE:  Open reduction and internal fixation of left distal radius using  the [employer] volar carpal plates system.   SURGEON:  Feliberto Gottron. Turner Daniels, M.D.   FIRST ASSISTANT:  __________, P.A.-C   ANESTHESIA:  General LMA.   ESTIMATED BLOOD LOSS:  Minimal.   FLUIDS REPLACED:  800 cc of crystalloid.   DRAINS PLACED:  None.   TOURNIQUET TIME:  35 minutes.   INDICATIONS FOR PROCEDURE:  Very active 76 year old woman who slipped and  fell a few days ago and sustained a dorsally comminuted left distal radius  fracture that was shortened, about 45 mm ulnar positive, she was ulnar  neutral on the contralateral side, and because of her activity level and  living by herself, she desires open reduction and internal fixation to  decrease pain, increase function, and get her back to independence as  quickly possible.  She was treated with a sugar tong splint by one of the  urgent care centers and simply could not tolerate it.   DESCRIPTION OF PROCEDURE:  Patient was identified by arm band, taken to the  operating room at [facility].  Appropriate anesthetic monitors  were attached.  General LMA anesthesia induced with the patient in the  supine position.  Tourniquet applied high to the mid left forearm, and the  left upper extremity prepped and draped in the usual  sterile fashion from  the finger tips to the tourniquet.  Limb was wrapped with an Esmarch bandage  and tourniquet inflated to 300 mmHg.  Began the procedure by making a volar  incision directly over the FCR tendon, starting out at the wrist flexion  crease with a small zig-zag and then going straight up the forearm for a  distance of about 7-8 cm.  Small bleeders in the skin and subcutaneous  tissue identified and cauterized.  The FCR tendon was taken to the ulnar  side of the wound and then we opened the fascia directly below the FCR  tendon, dropping down onto the pronator quadratus tendon.  Small Homan  retractors were placed on the medial and lateral sides of the distal radius.  We then elevated the pronator quadratus in a hockey stick incision, going  from ulnar to radial distally and  then going down the radial side of the  radius, elevating the muscle.  We identified the fracture site which had  little, if any, volar comminution and was easily reduced simply by volar  flexing the fracture site.  This contoured nicely with the VCP plate from  [employer], which was locked in place proximally with a single 3.5 mm  x 15 mm screw, allowing placement of the distal screw pegs one through four,  fixing the fracture distally.  Once this had been accomplished, the fracture  was actually quite stable and rigid.  The other three screw holes on the  plate itself proximally were then filled with 3.5 bicortical screws.  C-arm  images were taken confirming good reduction and the screws were the proper  length.  The wrist was taken through full pronation and full supination  without difficulty.  Dorsiflexion and palmar flexion were also accomplished  without any difficulty.  The tourniquet was let down, small bleeders were  identified and cauterized.  The wound was then closed with running 3-0  Vicryl suture in the pronator quadratus, running 3-0 Vicryl suture in the  subcutaneous tissue and  running, interlocking 4-0 nylon suture in the skin.  A dressing of Xeroform and 4 x 4 dressing smudges, Webril and Ace wrap and a  bell foam splint was then applied.  The patient was then awaken and taken to  the recovery room without difficulty.                                               Feliberto Gottron. Turner Daniels, M.D.    Ovid Curd  D:  05/15/2002  T:  05/15/2002  Job:  [id number]

## 2012-01-13 ENCOUNTER — Ambulatory Visit (INDEPENDENT_AMBULATORY_CARE_PROVIDER_SITE_OTHER): Payer: Medicare Other | Admitting: Family Medicine

## 2012-01-13 ENCOUNTER — Encounter: Payer: Self-pay | Admitting: Family Medicine

## 2012-01-13 VITALS — BP 177/86 | HR 86 | Temp 97.3°F | Resp 18 | Ht 61.0 in | Wt 124.0 lb

## 2012-01-13 DIAGNOSIS — I2510 Atherosclerotic heart disease of native coronary artery without angina pectoris: Secondary | ICD-10-CM

## 2012-01-13 DIAGNOSIS — E559 Vitamin D deficiency, unspecified: Secondary | ICD-10-CM | POA: Insufficient documentation

## 2012-01-13 DIAGNOSIS — Z0000 Encounter for general adult medical examination without abnormal findings: Secondary | ICD-10-CM

## 2012-01-13 DIAGNOSIS — Z23 Encounter for immunization: Secondary | ICD-10-CM

## 2012-01-13 DIAGNOSIS — I10 Essential (primary) hypertension: Secondary | ICD-10-CM

## 2012-01-13 DIAGNOSIS — M810 Age-related osteoporosis without current pathological fracture: Secondary | ICD-10-CM

## 2012-01-13 LAB — POCT URINALYSIS DIPSTICK
Ketones, UA: NEGATIVE
Protein, UA: NEGATIVE
Spec Grav, UA: <=1.005
Urobilinogen, UA: 0.2
pH, UA: 5.5

## 2012-01-22 ENCOUNTER — Encounter: Payer: Self-pay | Admitting: Family Medicine

## 2012-01-22 DIAGNOSIS — E785 Hyperlipidemia, unspecified: Secondary | ICD-10-CM | POA: Insufficient documentation

## 2012-01-22 DIAGNOSIS — Z720 Tobacco use: Secondary | ICD-10-CM | POA: Insufficient documentation
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
## 2012-02-03 ENCOUNTER — Telehealth: Payer: Self-pay

## 2012-02-03 NOTE — Telephone Encounter (Signed)
For Dr. Audria Nine: patient requested paperwork for [facility] to be filled out on [facility] 01/13/12 (CPE). Would like to know if it has been done.

## 2012-02-06 ENCOUNTER — Telehealth: Payer: Self-pay | Admitting: Radiology

## 2012-02-06 NOTE — Telephone Encounter (Signed)
PT STATES THEY ARE GOING TO SUSPEND HER LICENSE UNLESS THEY HEAR FROM [place] AND SHE SEES DR MCPHERSON, STATED DR MCPHERSON WAS SUPPOSE TO CALL THE DMV AND GET AN EXTENSION, NOT SURE IF THAT HAS BEEN DONE OR NOT. THEY NEED BY THE 19TH, PLEASE CALL [phone]

## 2012-02-06 NOTE — Telephone Encounter (Signed)
Called next door, Dr Audria Nine is out all week. I have spoken to [facility] and the papers are not at [address]. According to Dr Audria Nine note there were pages missing. I have called patient to advise we need all the pages, she wants to know if Dr Perrin Maltese can fill this out, I have advised he would need to see her, but he would still need the papers. She is advised to contact DMV and get the papers. We can not fill out if we do not have. She is upset and disconnected the call, I have made it clear we need her forms to fill this out for her.

## 2012-02-06 NOTE — Telephone Encounter (Signed)
Patient called back and was advised again we need entire package of papers, she states the pages missing do not apply to her situation, I advised this is a legal document from the [facility] and we must have all forms to complete the packet for her.

## 2012-02-07 ENCOUNTER — Telehealth: Payer: Self-pay | Admitting: Radiology

## 2012-02-07 NOTE — Telephone Encounter (Signed)
I have spoken to patients friend who has called for her, Rex Kras. Patients forms are at [address] to be filled out, I have advised her the forms are waiting for Dr Audria Nine, she will be back in the office on Monday. I have given the forms to [facility] to have Dr Audria Nine advise and fill out

## 2012-02-08 ENCOUNTER — Telehealth: Payer: Self-pay

## 2012-02-08 NOTE — Telephone Encounter (Signed)
Spoke with pt, she doesn't know what Dr Allyson Sabal wrote because she never saw the forms. We should call Dr Allyson Sabal at [phone] and see if he could fax [place] over what he recommended for pt. Unable to call, office closed at 5pm.

## 2012-02-09 ENCOUNTER — Telehealth: Payer: Self-pay | Admitting: *Deleted

## 2012-02-10 ENCOUNTER — Telehealth: Payer: Self-pay | Admitting: *Deleted

## 2012-02-10 NOTE — Telephone Encounter (Signed)
Patient was called on cell # [phone], I asked her to drop-off copy of paperwork (DMV). She stated Dr Allyson Sabal did not give her copies before he mailed them to [facility] in July. Also you have forms (DMV) to complete. I advised her you got an extension for her to continue to drive until 6/96/29, which will expire on that date.  She can ignore "cancellation notice" from [facility] when she receives it.

## 2012-02-11 NOTE — Telephone Encounter (Signed)
I will try contacting Dr. Hazle Coca office at [phone] upon my return to work on Feb 15, 2012 and request copy of his report to [facility] (when I spoke with DMV, I was told pt had a copy of report - DMV could not provide that to me and it was suggested I get it from the pt). Completed form is due back to DMV by 03/09/2012.

## 2012-03-07 ENCOUNTER — Telehealth: Payer: Self-pay | Admitting: *Deleted

## 2012-03-07 NOTE — Telephone Encounter (Signed)
The DMV form for this pt is complete and was faxed to [facility] on 03/07/2012. Document will be mailed to pt and copy retained in paper chart. Clinical staff at [address] will call pt to let her know status of form.

## 2012-03-07 NOTE — Telephone Encounter (Signed)
Called patient and notified her that her DMV paperwork was faxed and that I sent her the originals in the mail today.  A copy of the original was placed in her chart. [name], Marion Downer

## 2013-03-08 NOTE — Telephone Encounter (Signed)
Error

## 2013-05-10 ENCOUNTER — Telehealth: Payer: Self-pay

## 2013-05-10 NOTE — Telephone Encounter (Signed)
[name] - Patient called in regards to the [facility] form you filled out for her last year (be advised this conversation was very hard to follow and she did not sound sure of what she really wants for sure.)  She says she just wants to talk to you about the forms from last year, that she never saw them before you sent them in and that now she has been sent another one to be filled out.  She believes that you requested she has one of these each year.  She also spoke about some other doctor and her medical condition in relation to last year's forms.  [phone]

## 2013-05-10 NOTE — Telephone Encounter (Signed)
I do not have a copy of the forms. Did you recommend she have these done? Sounds reasonable when I review your note, she only presented one page of the [facility] forms for you. Please advise, I will be happy to call her.

## 2013-05-11 NOTE — Telephone Encounter (Signed)
Pt needs to schedule a visit- last seen by me in July 2013.

## 2013-05-13 NOTE — Telephone Encounter (Signed)
Called to advise. Left message for her to call back and make an appt.

## 2013-05-20 ENCOUNTER — Telehealth: Payer: Self-pay

## 2013-05-20 NOTE — Telephone Encounter (Signed)
I tried to call her last week regarding this. She did not reply to my calls, I will call again.
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
## 2013-05-21 NOTE — Telephone Encounter (Signed)
Called she should see Dr Audria Nine, and she will be required to do this yearly. Left message for her to call me back.

## 2013-05-24 NOTE — Telephone Encounter (Signed)
Patient has not responded to any of my calls.

## 2013-06-09 ENCOUNTER — Ambulatory Visit: Payer: Self-pay | Admitting: Internal Medicine

## 2013-06-09 VITALS — BP 124/74 | HR 72 | Temp 98.0°F | Resp 17 | Ht 61.0 in | Wt 117.0 lb

## 2013-06-09 DIAGNOSIS — I10 Essential (primary) hypertension: Secondary | ICD-10-CM

## 2013-06-09 DIAGNOSIS — E785 Hyperlipidemia, unspecified: Secondary | ICD-10-CM

## 2013-06-09 DIAGNOSIS — I2510 Atherosclerotic heart disease of native coronary artery without angina pectoris: Secondary | ICD-10-CM

## 2013-06-09 DIAGNOSIS — Z0289 Encounter for other administrative examinations: Secondary | ICD-10-CM
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
## 2013-07-17 ENCOUNTER — Other Ambulatory Visit: Payer: Self-pay | Admitting: Cardiovascular Disease

## 2013-08-20 ENCOUNTER — Other Ambulatory Visit: Payer: Self-pay | Admitting: Cardiovascular Disease

## 2013-08-20 NOTE — Telephone Encounter (Signed)
Rx was sent to pharmacy electronically. 

## 2013-08-21 ENCOUNTER — Other Ambulatory Visit: Payer: Self-pay | Admitting: Cardiovascular Disease

## 2013-08-23 NOTE — Telephone Encounter (Signed)
Rx was sent to pharmacy electronically. 

## 2013-09-18 ENCOUNTER — Other Ambulatory Visit: Payer: Self-pay | Admitting: *Deleted

## 2013-09-18 MED ORDER — METOPROLOL SUCCINATE ER 50 MG PO TB24: ORAL_TABLET | ORAL | Status: DC

## 2013-09-18 NOTE — Telephone Encounter (Signed)
Rx refill st to pharmacy for 5 days. Pt. Instructed to keep appointment on 09/24/13 for further refills.

## 2013-09-19 ENCOUNTER — Other Ambulatory Visit: Payer: Self-pay | Admitting: *Deleted

## 2013-09-19 MED ORDER — LOSARTAN POTASSIUM 100 MG PO TABS: ORAL_TABLET | ORAL | Status: DC

## 2013-09-19 MED ORDER — PRAVASTATIN SODIUM 40 MG PO TABS: 40.0000 mg | ORAL_TABLET | Freq: Every day | ORAL | Status: DC

## 2013-09-19 NOTE — Telephone Encounter (Signed)
Rx. Refill sent for one month. Patient instructed to keep appt. With Dr.Berry on 09/24/13 for further refills

## 2013-09-24 ENCOUNTER — Encounter: Payer: Self-pay | Admitting: Cardiovascular Disease

## 2013-09-24 ENCOUNTER — Ambulatory Visit (INDEPENDENT_AMBULATORY_CARE_PROVIDER_SITE_OTHER): Payer: PRIVATE HEALTH INSURANCE | Admitting: Cardiovascular Disease

## 2013-09-24 VITALS — BP 128/70 | HR 69 | Ht 61.0 in | Wt 121.0 lb

## 2013-09-24 DIAGNOSIS — E782 Mixed hyperlipidemia: Secondary | ICD-10-CM

## 2013-09-24 DIAGNOSIS — I2510 Atherosclerotic heart disease of native coronary artery without angina pectoris: Secondary | ICD-10-CM

## 2013-09-24 DIAGNOSIS — Z79899 Other long term (current) drug therapy: Secondary | ICD-10-CM

## 2013-09-24 DIAGNOSIS — E785 Hyperlipidemia, unspecified: Secondary | ICD-10-CM

## 2013-09-24 DIAGNOSIS — I10 Essential (primary) hypertension: Secondary | ICD-10-CM

## 2013-09-24 NOTE — Assessment & Plan Note (Signed)
Well-controlled on current medications 

## 2013-09-24 NOTE — Progress Notes (Signed)
09/24/2013 [name]   10/12/1934  [id number]  Primary Physician No PCP Per Patient Primary Cardiologist: Lorretta Harp MD [name]    HPI:  The patient is a 79 year old thin-appearing widowed Caucasian female, mother of 1 child, whom I last saw a year ago. She has a history of CAD, status post LAD angioplasty by me back in 1995. Her other problems include hypertension, hyperlipidemia, and hypothyroidism. She denies chest pain or shortness of breath. Her last Myoview, performed May 28, 2009, was nonischemic. It has been over a year since her last lipid profile which we will recheck.   Current Outpatient Prescriptions  Medication Sig Dispense Refill  . aspirin 81 MG tablet Take 81 mg by mouth daily.      . calcium citrate (CALCITRATE - DOSED IN MG ELEMENTAL CALCIUM) 950 MG tablet Take 1 tablet by mouth daily. 400 mg      . cholecalciferol (VITAMIN D) 1000 UNITS tablet Take 2,000 Units by mouth 2 (two) times daily.       Marland Kitchen ezetimibe (ZETIA) 10 MG tablet Take 1 tablet (10mg  total) by mouth daily. <MUST KEEP APPOINTMENT>  30 tablet  10  . loratadine (CLARITIN) 10 MG tablet Take 10 mg by mouth daily.      Marland Kitchen losartan (COZAAR) 100 MG tablet Take 1 tablet (100mg  total) by mouth daily. <MUST KEEP APPOINTMENT>  30 tablet  0  . metoprolol succinate (TOPROL-XL) 50 MG 24 hr tablet Take 1.5 tablets (75mg  total) by mouth daily. <MUST KEEP APPOINTMENT>  8 tablet  0  . pravastatin (PRAVACHOL) 40 MG tablet Take 1 tablet (40 mg total) by mouth daily.  15 tablet  0   No current facility-administered medications for this visit.    Allergies  Allergen Reactions  . Sulfa Antibiotics   . Codeine     History   Social History  . Marital Status: Widowed    Spouse Name: N/A    Number of Children: N/A  . Years of Education: N/A   Occupational History  . Not on file.   Social History Main Topics  . Smoking status: Current Some Day Smoker    Types: Cigarettes  .  Smokeless tobacco: Not on file  . Alcohol Use: No  . Drug Use: No  . Sexual Activity: No   Other Topics Concern  . Not on file   Social History Narrative  . No narrative on file     Review of Systems: General: negative for chills, fever, night sweats or weight changes.  Cardiovascular: negative for chest pain, dyspnea on exertion, edema, orthopnea, palpitations, paroxysmal nocturnal dyspnea or shortness of breath Dermatological: negative for rash Respiratory: negative for cough or wheezing Urologic: negative for hematuria Abdominal: negative for nausea, vomiting, diarrhea, bright red blood per rectum, melena, or hematemesis Neurologic: negative for visual changes, syncope, or dizziness All other systems reviewed and are otherwise negative except as noted above.    Blood pressure 128/70, pulse 69, height 5\' 1"  (1.549 m), weight 121 lb (54.885 kg).  General appearance: alert and no distress Neck: no adenopathy, no carotid bruit, no JVD, supple, symmetrical, trachea midline and thyroid not enlarged, symmetric, no tenderness/mass/nodules Lungs: clear to auscultation bilaterally Heart: regular rate and rhythm, S1, S2 normal, no murmur, click, rub or gallop Extremities: extremities normal, atraumatic, no cyanosis or edema  EKG sinus rhythm at 72 without ST or T wave changes  ASSESSMENT AND PLAN:   CAD (coronary artery disease) History of LAD  enteroplasty by myself back in 1995. The RCA did have moderate disease and LV function was normal. Last Myoview performed 05/28/09 was nonischemic. The patient denies chest pain or shortness of breath.  HTN (hypertension), benign Well-controlled on current medications  Hyperlipidemia On statin therapy. We will recheck a lipid and liver profile      Lorretta Harp MD [facility], [facility] 09/24/2013 2:59 PM

## 2013-09-24 NOTE — Patient Instructions (Signed)
  We will see you back in follow up in 1 year with Dr Berry  Dr Berry has ordered blood work to be done fasting.    

## 2013-09-24 NOTE — Assessment & Plan Note (Addendum)
On statin therapy. We will recheck a lipid and liver profile 

## 2013-09-24 NOTE — Assessment & Plan Note (Signed)
History of LAD enteroplasty by myself back in 1995. The RCA did have moderate disease and LV function was normal. Last Myoview performed 05/28/09 was nonischemic. The patient denies chest pain or shortness of breath.

## 2013-09-25 ENCOUNTER — Other Ambulatory Visit: Payer: Self-pay

## 2013-09-25 MED ORDER — LOSARTAN POTASSIUM 100 MG PO TABS: 100.0000 mg | ORAL_TABLET | Freq: Every day | ORAL | Status: DC

## 2013-09-25 MED ORDER — PRAVASTATIN SODIUM 40 MG PO TABS: 40.0000 mg | ORAL_TABLET | Freq: Every day | ORAL | Status: DC

## 2013-09-25 MED ORDER — METOPROLOL SUCCINATE ER 50 MG PO TB24: ORAL_TABLET | ORAL | Status: DC

## 2013-09-25 NOTE — Telephone Encounter (Signed)
Rx was sent to pharmacy electronically. 

## 2013-09-25 NOTE — Addendum Note (Signed)
Addended by: Diana Eves on: 09/25/2013 08:43 AM   Modules accepted: Orders

## 2013-11-20 ENCOUNTER — Emergency Department (HOSPITAL_COMMUNITY): Payer: Medicare Other

## 2013-11-20 ENCOUNTER — Encounter (HOSPITAL_COMMUNITY): Payer: Self-pay | Admitting: Emergency Medicine

## 2013-11-20 ENCOUNTER — Inpatient Hospital Stay (HOSPITAL_COMMUNITY)
Admission: EM | Admit: 2013-11-20 | Discharge: 2013-11-25 | DRG: 054 | Disposition: A | Discharge status: Other Acute Inpatient Hospital | Payer: Medicare Other | Attending: Internal Medicine | Admitting: Internal Medicine

## 2013-11-20 DIAGNOSIS — G9340 Encephalopathy, unspecified: Secondary | ICD-10-CM | POA: Diagnosis present

## 2013-11-20 DIAGNOSIS — F17200 Nicotine dependence, unspecified, uncomplicated: Secondary | ICD-10-CM | POA: Diagnosis present

## 2013-11-20 DIAGNOSIS — R410 Disorientation, unspecified: Secondary | ICD-10-CM

## 2013-11-20 DIAGNOSIS — N390 Urinary tract infection, site not specified: Secondary | ICD-10-CM | POA: Diagnosis present

## 2013-11-20 DIAGNOSIS — E559 Vitamin D deficiency, unspecified: Secondary | ICD-10-CM | POA: Diagnosis present

## 2013-11-20 DIAGNOSIS — S1093XA Contusion of unspecified part of neck, initial encounter: Secondary | ICD-10-CM

## 2013-11-20 DIAGNOSIS — W19XXXA Unspecified fall, initial encounter: Secondary | ICD-10-CM | POA: Diagnosis present

## 2013-11-20 DIAGNOSIS — S0083XA Contusion of other part of head, initial encounter: Secondary | ICD-10-CM | POA: Diagnosis present

## 2013-11-20 DIAGNOSIS — F200 Paranoid schizophrenia: Secondary | ICD-10-CM | POA: Diagnosis present

## 2013-11-20 DIAGNOSIS — Z7982 Long term (current) use of aspirin: Secondary | ICD-10-CM

## 2013-11-20 DIAGNOSIS — F22 Delusional disorders: Secondary | ICD-10-CM

## 2013-11-20 DIAGNOSIS — G911 Obstructive hydrocephalus: Secondary | ICD-10-CM | POA: Diagnosis present

## 2013-11-20 DIAGNOSIS — Z9861 Coronary angioplasty status: Secondary | ICD-10-CM

## 2013-11-20 DIAGNOSIS — I252 Old myocardial infarction: Secondary | ICD-10-CM

## 2013-11-20 DIAGNOSIS — I10 Essential (primary) hypertension: Secondary | ICD-10-CM | POA: Diagnosis present

## 2013-11-20 DIAGNOSIS — I2510 Atherosclerotic heart disease of native coronary artery without angina pectoris: Secondary | ICD-10-CM

## 2013-11-20 DIAGNOSIS — D332 Benign neoplasm of brain, unspecified: Principal | ICD-10-CM | POA: Diagnosis present

## 2013-11-20 DIAGNOSIS — G919 Hydrocephalus, unspecified: Secondary | ICD-10-CM

## 2013-11-20 DIAGNOSIS — R911 Solitary pulmonary nodule: Secondary | ICD-10-CM | POA: Diagnosis present

## 2013-11-20 DIAGNOSIS — F29 Unspecified psychosis not due to a substance or known physiological condition: Secondary | ICD-10-CM

## 2013-11-20 DIAGNOSIS — A498 Other bacterial infections of unspecified site: Secondary | ICD-10-CM | POA: Diagnosis present

## 2013-11-20 DIAGNOSIS — H5316 Psychophysical visual disturbances: Secondary | ICD-10-CM | POA: Diagnosis present

## 2013-11-20 DIAGNOSIS — M810 Age-related osteoporosis without current pathological fracture: Secondary | ICD-10-CM | POA: Diagnosis present

## 2013-11-20 DIAGNOSIS — E785 Hyperlipidemia, unspecified: Secondary | ICD-10-CM | POA: Diagnosis present

## 2013-11-20 DIAGNOSIS — S0003XA Contusion of scalp, initial encounter: Secondary | ICD-10-CM | POA: Diagnosis present

## 2013-11-20 DIAGNOSIS — Z720 Tobacco use: Secondary | ICD-10-CM

## 2013-11-20 DIAGNOSIS — R55 Syncope and collapse: Secondary | ICD-10-CM

## 2013-11-20 LAB — URINALYSIS, ROUTINE W REFLEX MICROSCOPIC
Bilirubin Urine: NEGATIVE
Glucose, UA: NEGATIVE mg/dL
Ketones, ur: NEGATIVE mg/dL
Nitrite: NEGATIVE
PROTEIN: 30 mg/dL — AB
Specific Gravity, Urine: 1.013 (ref 1.005–1.030)
UROBILINOGEN UA: 0.2 mg/dL (ref 0.0–1.0)
pH: 5.5 (ref 5.0–8.0)

## 2013-11-20 LAB — COMPREHENSIVE METABOLIC PANEL
ALT: 21 U/L (ref 0–35)
AST: 30 U/L (ref 0–37)
Albumin: 4.1 g/dL (ref 3.5–5.2)
Alkaline Phosphatase: 99 U/L (ref 39–117)
BILIRUBIN TOTAL: 0.3 mg/dL (ref 0.3–1.2)
BUN: 20 mg/dL (ref 6–23)
CO2: 23 meq/L (ref 19–32)
CREATININE: 0.97 mg/dL (ref 0.50–1.10)
Calcium: 10.9 mg/dL — ABNORMAL HIGH (ref 8.4–10.5)
Chloride: 109 mEq/L (ref 96–112)
GFR, EST AFRICAN AMERICAN: 64 mL/min — AB (ref >90)
GFR, EST NON AFRICAN AMERICAN: 55 mL/min — AB (ref >90)
GLUCOSE: 88 mg/dL (ref 70–99)
Potassium: 4.6 mEq/L (ref 3.7–5.3)
Sodium: 145 mEq/L (ref 137–147)
Total Protein: 7.7 g/dL (ref 6.0–8.3)

## 2013-11-20 LAB — RAPID URINE DRUG SCREEN, HOSP PERFORMED
Amphetamines: NOT DETECTED
Barbiturates: NOT DETECTED
Benzodiazepines: NOT DETECTED
Cocaine: NOT DETECTED
OPIATES: NOT DETECTED
Tetrahydrocannabinol: NOT DETECTED

## 2013-11-20 LAB — CBC
HEMATOCRIT: 43.4 % (ref 36.0–46.0)
Hemoglobin: 13.6 g/dL (ref 12.0–15.0)
MCH: 25.6 pg — ABNORMAL LOW (ref 26.0–34.0)
MCHC: 31.3 g/dL (ref 30.0–36.0)
MCV: 81.7 fL (ref 78.0–100.0)
PLATELETS: 231 10*3/uL (ref 150–400)
RBC: 5.31 MIL/uL — AB (ref 3.87–5.11)
RDW: 16.5 % — ABNORMAL HIGH (ref 11.5–15.5)
WBC: 8.5 10*3/uL (ref 4.0–10.5)

## 2013-11-20 LAB — I-STAT TROPONIN, ED: TROPONIN I, POC: 0 ng/mL (ref 0.00–0.08)

## 2013-11-20 LAB — URINE MICROSCOPIC-ADD ON

## 2013-11-20 LAB — TSH: TSH: 0.033 u[IU]/mL — AB (ref 0.350–4.500)

## 2013-11-20 MED ORDER — DEXTROSE 5 % IV SOLN
1.0000 g | Freq: Once | INTRAVENOUS | Status: AC
  Administered 2013-11-21: 1 g via INTRAVENOUS
  Filled 2013-11-20: qty 10
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
## 2013-11-21 ENCOUNTER — Inpatient Hospital Stay (HOSPITAL_COMMUNITY): Payer: Medicare Other

## 2013-11-21 ENCOUNTER — Encounter (HOSPITAL_COMMUNITY): Payer: Self-pay | Admitting: Internal Medicine

## 2013-11-21 ENCOUNTER — Observation Stay (HOSPITAL_COMMUNITY): Payer: Medicare Other

## 2013-11-21 DIAGNOSIS — R55 Syncope and collapse: Secondary | ICD-10-CM

## 2013-11-21 DIAGNOSIS — I2510 Atherosclerotic heart disease of native coronary artery without angina pectoris: Secondary | ICD-10-CM

## 2013-11-21 DIAGNOSIS — I10 Essential (primary) hypertension: Secondary | ICD-10-CM

## 2013-11-21 DIAGNOSIS — R911 Solitary pulmonary nodule: Secondary | ICD-10-CM

## 2013-11-21 DIAGNOSIS — R410 Disorientation, unspecified: Secondary | ICD-10-CM | POA: Insufficient documentation

## 2013-11-21 DIAGNOSIS — R404 Transient alteration of awareness: Secondary | ICD-10-CM

## 2013-11-21 LAB — CBC
HEMATOCRIT: 38.5 % (ref 36.0–46.0)
HEMOGLOBIN: 12.2 g/dL (ref 12.0–15.0)
MCH: 25.5 pg — ABNORMAL LOW (ref 26.0–34.0)
MCHC: 31.7 g/dL (ref 30.0–36.0)
MCV: 80.5 fL (ref 78.0–100.0)
Platelets: 200 10*3/uL (ref 150–400)
RBC: 4.78 MIL/uL (ref 3.87–5.11)
RDW: 16.5 % — ABNORMAL HIGH (ref 11.5–15.5)
WBC: 7.8 10*3/uL (ref 4.0–10.5)

## 2013-11-21 LAB — CREATININE, SERUM
CREATININE: 0.86 mg/dL (ref 0.50–1.10)
GFR calc Af Amer: 74 mL/min — ABNORMAL LOW (ref >90)
GFR, EST NON AFRICAN AMERICAN: 64 mL/min — AB (ref >90)

## 2013-11-21 LAB — T3, FREE: T3, Free: 3.4 pg/mL (ref 2.3–4.2)

## 2013-11-21 LAB — T4, FREE: FREE T4: 1.93 ng/dL — AB (ref 0.80–1.80)

## 2013-11-21 LAB — CK: Total CK: 109 U/L (ref 7–177)

## 2013-11-21 LAB — SALICYLATE LEVEL

## 2013-11-21 LAB — TROPONIN I
Troponin I: <0.3 ng/mL (ref <0.30)
Troponin I: <0.3 ng/mL (ref <0.30)

## 2013-11-21 LAB — CBG MONITORING, ED: Glucose-Capillary: 103 mg/dL — ABNORMAL HIGH (ref 70–99)

## 2013-11-21 LAB — ETHANOL

## 2013-11-21 LAB — ACETAMINOPHEN LEVEL: Acetaminophen (Tylenol), Serum: <15 ug/mL (ref 10–30)

## 2013-11-21 MED ORDER — DEXTROSE 5 % IV SOLN
1.0000 g | INTRAVENOUS | Status: DC
  Administered 2013-11-24 – 2013-11-25 (×2): 1 g via INTRAVENOUS
  Filled 2013-11-21 (×5): qty 10

## 2013-11-21 MED ORDER — DEXTROSE 5 % IV SOLN: 1.0000 g | INTRAVENOUS | Status: DC

## 2013-11-21 MED ORDER — ENSURE COMPLETE PO LIQD
237.0000 mL | Freq: Three times a day (TID) | ORAL | Status: DC
  Administered 2013-11-25 (×3): 237 mL via ORAL

## 2013-11-21 MED ORDER — EZETIMIBE 10 MG PO TABS
10.0000 mg | ORAL_TABLET | Freq: Every day | ORAL | Status: DC
Start: 2013-11-21 — End: 2013-11-25
  Administered 2013-11-21 – 2013-11-25 (×4): 10 mg via ORAL
  Filled 2013-11-21 (×6): qty 1

## 2013-11-21 MED ORDER — LORATADINE 10 MG PO TABS
10.0000 mg | ORAL_TABLET | Freq: Every day | ORAL | Status: DC
  Administered 2013-11-21 – 2013-11-25 (×4): 10 mg via ORAL
  Filled 2013-11-21 (×6): qty 1

## 2013-11-21 MED ORDER — SIMVASTATIN 20 MG PO TABS
20.0000 mg | ORAL_TABLET | Freq: Every day | ORAL | Status: DC
  Administered 2013-11-23 – 2013-11-25 (×3): 20 mg via ORAL
  Filled 2013-11-21 (×5): qty 1

## 2013-11-21 MED ORDER — ASPIRIN 325 MG PO TABS
325.0000 mg | ORAL_TABLET | Freq: Every day | ORAL | Status: DC
  Administered 2013-11-21 – 2013-11-25 (×4): 325 mg via ORAL
  Filled 2013-11-21 (×5): qty 1

## 2013-11-21 MED ORDER — LOSARTAN POTASSIUM 50 MG PO TABS
100.0000 mg | ORAL_TABLET | Freq: Every day | ORAL | Status: DC
  Administered 2013-11-21 – 2013-11-25 (×4): 100 mg via ORAL
  Filled 2013-11-21 (×6): qty 2

## 2013-11-21 MED ORDER — ENOXAPARIN SODIUM 40 MG/0.4ML ~~LOC~~ SOLN
40.0000 mg | SUBCUTANEOUS | Status: DC
  Administered 2013-11-21 – 2013-11-25 (×4): 40 mg via SUBCUTANEOUS
  Filled 2013-11-21 (×6): qty 0.4

## 2013-11-21 MED ORDER — SODIUM CHLORIDE 0.9 % IV SOLN
INTRAVENOUS | Status: AC
  Administered 2013-11-21: 04:00:00 via INTRAVENOUS

## 2013-11-21 MED ORDER — IOHEXOL 350 MG/ML SOLN
80.0000 mL | Freq: Once | INTRAVENOUS | Status: AC | PRN
  Administered 2013-11-21: 80 mL via INTRAVENOUS

## 2013-11-21 MED ORDER — ADULT MULTIVITAMIN W/MINERALS CH
1.0000 | ORAL_TABLET | Freq: Every day | ORAL | Status: DC
  Administered 2013-11-23 – 2013-11-25 (×3): 1 via ORAL
  Filled 2013-11-21 (×6): qty 1
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
## 2013-11-21 NOTE — Progress Notes (Signed)
EEG Completed; Results Pending  
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
## 2013-11-21 NOTE — Consult Note (Signed)
Reason for Consult: Hydrocephalus Referring Physician: Dr. Waylan Rocher [name] is an 79 y.o. female.  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 The patient was found on the side of the road apparently confused and unaware of how to get home. He was brought in for evaluation. 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 The mass was present on the scan from 2010.  Clinically the patient notes that she is aware of the mass but she is denying that she became confused she believes that she may have been monitored on the side of her. At this time she does not wish to engage in any discussion about hydrocephalus or bladder tumor, she was told previously by a specialist that she should not consider surgery for this lesion.  Past Medical History  Diagnosis Date  . Hyperlipidemia   . Essential hypertension, benign   . CAD (coronary artery disease)   . Osteoporosis   . Hypercalcemia   . Vitamin D deficiency   . Ovarian tumor   . Breast tumor   . MI (myocardial infarction) July 1995    Treated with LAD PCI- Dr Adora Fridge  . Cataract     Past Surgical History  Procedure Laterality Date  . Angioplasty    . Hip pinning    . Breast lumpectomy    . Abdominal hysterectomy    . Cholecystectomy    . Fracture surgery  Nov 2003    L distal radius fx- Dr. Frederik Pear  . Eye surgery      Cataract removal    Family History  Problem Relation Age of Onset  . Stroke Mother   . Heart disease Father   . Cancer Sister     Social History:  reports that she has been smoking Cigarettes.  She has been smoking about 0.00 packs per day. She does not have any smokeless tobacco history on file. She reports that she does not drink alcohol or use illicit drugs.  Allergies:  Allergies  Allergen Reactions  . Sulfa Antibiotics Swelling  . Codeine Nausea And Vomiting  .  Other Nausea And Vomiting    Some type of pill given for allergic reaction     Medications: Medication list  is unavailable  Results for orders placed during the hospital encounter of 11/20/13 (from the past 48 hour(s))  CBC     Status: Abnormal   Collection Time    11/20/13 10:25 PM      Result Value Ref Range   WBC 8.5  4.0 - 10.5 K/uL   RBC 5.31 (*) 3.87 - 5.11 MIL/uL   Hemoglobin 13.6  12.0 - 15.0 g/dL   HCT 43.4  36.0 - 46.0 %   MCV 81.7  78.0 - 100.0 fL   MCH 25.6 (*) 26.0 - 34.0 pg   MCHC 31.3  30.0 - 36.0 g/dL   RDW 16.5 (*) 11.5 - 15.5 %   Platelets 231  150 - 400 K/uL  COMPREHENSIVE METABOLIC PANEL     Status: Abnormal   Collection Time    11/20/13 10:25 PM      Result Value Ref Range   Sodium 145  137 - 147 mEq/L   Potassium 4.6  3.7 - 5.3 mEq/L   Chloride 109  96 - 112 mEq/L   CO2 23  19 - 32 mEq/L   Glucose,  Bld 88  70 - 99 mg/dL   BUN 20  6 - 23 mg/dL   Creatinine, Ser 0.97  0.50 - 1.10 mg/dL   Calcium 10.9 (*) 8.4 - 10.5 mg/dL   Total Protein 7.7  6.0 - 8.3 g/dL   Albumin 4.1  3.5 - 5.2 g/dL   AST 30  0 - 37 U/L   ALT 21  0 - 35 U/L   Alkaline Phosphatase 99  39 - 117 U/L   Total Bilirubin 0.3  0.3 - 1.2 mg/dL   GFR calc non Af Amer 55 (*) >90 mL/min   GFR calc Af Amer 64 (*) >90 mL/min   Comment: (NOTE)     The eGFR has been calculated using the CKD EPI equation.     This calculation has not been validated in all clinical situations.     eGFR's persistently <90 mL/min signify possible Chronic Kidney     Disease.  TSH     Status: Abnormal   Collection Time    11/20/13 10:25 PM      Result Value Ref Range   TSH 0.033 (*) 0.350 - 4.500 uIU/mL   Comment: Please note change in reference range.  URINALYSIS, ROUTINE W REFLEX MICROSCOPIC     Status: Abnormal   Collection Time    11/20/13 10:38 PM      Result Value Ref Range   Color, Urine YELLOW  YELLOW   APPearance CLEAR  CLEAR   Specific Gravity, Urine 1.013  1.005 - 1.030   pH 5.5  5.0 - 8.0    Glucose, UA NEGATIVE  NEGATIVE mg/dL   Hgb urine dipstick TRACE (*) NEGATIVE   Bilirubin Urine NEGATIVE  NEGATIVE   Ketones, ur NEGATIVE  NEGATIVE mg/dL   Protein, ur 30 (*) NEGATIVE mg/dL   Urobilinogen, UA 0.2  0.0 - 1.0 mg/dL   Nitrite NEGATIVE  NEGATIVE   Leukocytes, UA MODERATE (*) NEGATIVE  URINE RAPID DRUG SCREEN (HOSP PERFORMED)     Status: None   Collection Time    11/20/13 10:38 PM      Result Value Ref Range   Opiates NONE DETECTED  NONE DETECTED   Cocaine NONE DETECTED  NONE DETECTED   Benzodiazepines NONE DETECTED  NONE DETECTED   Amphetamines NONE DETECTED  NONE DETECTED   Tetrahydrocannabinol NONE DETECTED  NONE DETECTED   Barbiturates NONE DETECTED  NONE DETECTED   Comment:            DRUG SCREEN FOR MEDICAL PURPOSES     ONLY.  IF CONFIRMATION IS NEEDED     FOR ANY PURPOSE, NOTIFY LAB     WITHIN 5 DAYS.                LOWEST DETECTABLE LIMITS     FOR URINE DRUG SCREEN     Drug Class       Cutoff (ng/mL)     Amphetamine      1000     Barbiturate      200     Benzodiazepine   937     Tricyclics       902     Opiates          300     Cocaine          300     THC              50  URINE MICROSCOPIC-ADD ON     Status: Abnormal  Collection Time    11/20/13 10:38 PM      Result Value Ref Range   Squamous Epithelial / LPF RARE  RARE   WBC, UA 21-50  <3 WBC/hpf   RBC / HPF 0-2  <3 RBC/hpf   Bacteria, UA FEW (*) RARE  I-STAT TROPOININ, ED     Status: None   Collection Time    11/20/13 10:54 PM      Result Value Ref Range   Troponin i, poc 0.00  0.00 - 0.08 ng/mL   Comment 3            Comment: Due to the release kinetics of cTnI,     a negative result within the first hours     of the onset of symptoms does not rule out     myocardial infarction with certainty.     If myocardial infarction is still suspected,     repeat the test at appropriate intervals.  ETHANOL     Status: None   Collection Time    11/20/13 11:03 PM      Result Value Ref Range    Alcohol, Ethyl (B) <11  0 - 11 mg/dL   Comment:            LOWEST DETECTABLE LIMIT FOR     SERUM ALCOHOL IS 11 mg/dL     FOR MEDICAL PURPOSES ONLY  ACETAMINOPHEN LEVEL     Status: None   Collection Time    11/20/13 11:37 PM      Result Value Ref Range   Acetaminophen (Tylenol), Serum <15.0  10 - 30 ug/mL   Comment:            THERAPEUTIC CONCENTRATIONS VARY     SIGNIFICANTLY. A RANGE OF 10-30     ug/mL MAY BE AN EFFECTIVE     CONCENTRATION FOR MANY PATIENTS.     HOWEVER, SOME ARE BEST TREATED     AT CONCENTRATIONS OUTSIDE THIS     RANGE.     ACETAMINOPHEN CONCENTRATIONS     >150 ug/mL AT 4 HOURS AFTER     INGESTION AND >50 ug/mL AT 12     HOURS AFTER INGESTION ARE     OFTEN ASSOCIATED WITH TOXIC     REACTIONS.  SALICYLATE LEVEL     Status: Abnormal   Collection Time    11/20/13 11:37 PM      Result Value Ref Range   Salicylate Lvl <2.0 (*) 2.8 - 20.0 mg/dL  CBG MONITORING, ED     Status: Abnormal   Collection Time    11/21/13  1:20 AM      Result Value Ref Range   Glucose-Capillary 103 (*) 70 - 99 mg/dL  CK     Status: None   Collection Time    11/21/13  1:52 AM      Result Value Ref Range   Total CK 109  7 - 177 U/L  TROPONIN I     Status: None   Collection Time    11/21/13  4:28 AM      Result Value Ref Range   Troponin I <0.30  <0.30 ng/mL   Comment:            Due to the release kinetics of cTnI,     a negative result within the first hours     of the onset of symptoms does not rule out     myocardial infarction with certainty.     If  myocardial infarction is still suspected,     repeat the test at appropriate intervals.  CBC     Status: Abnormal   Collection Time    11/21/13  4:28 AM      Result Value Ref Range   WBC 7.8  4.0 - 10.5 K/uL   RBC 4.78  3.87 - 5.11 MIL/uL   Hemoglobin 12.2  12.0 - 15.0 g/dL   HCT 93.2  63.8 - 41.3 %   MCV 80.5  78.0 - 100.0 fL   MCH 25.5 (*) 26.0 - 34.0 pg   MCHC 31.7  30.0 - 36.0 g/dL   RDW 31.3 (*) 43.8 - 81.7 %    Platelets 200  150 - 400 K/uL  CREATININE, SERUM     Status: Abnormal   Collection Time    11/21/13  4:28 AM      Result Value Ref Range   Creatinine, Ser 0.86  0.50 - 1.10 mg/dL   GFR calc non Af Amer 64 (*) >90 mL/min   GFR calc Af Amer 74 (*) >90 mL/min   Comment: (NOTE)     The eGFR has been calculated using the CKD EPI equation.     This calculation has not been validated in all clinical situations.     eGFR's persistently <90 mL/min signify possible Chronic Kidney     Disease.  T4, FREE     Status: Abnormal   Collection Time    11/21/13  4:28 AM      Result Value Ref Range   Free T4 1.93 (*) 0.80 - 1.80 ng/dL   Comment: Performed at [facility]  T3, FREE     Status: None   Collection Time    11/21/13  4:28 AM      Result Value Ref Range   T3, Free 3.4  2.3 - 4.2 pg/mL   Comment: Performed at [facility]  TROPONIN I     Status: None   Collection Time    11/21/13  9:15 AM      Result Value Ref Range   Troponin I <0.30  <0.30 ng/mL   Comment:            Due to the release kinetics of cTnI,     a negative result within the first hours     of the onset of symptoms does not rule out     myocardial infarction with certainty.     If myocardial infarction is still suspected,     repeat the test at appropriate intervals.    Dg Chest 2 View  11/21/2013   CLINICAL DATA:  Altered mental status.  Left shoulder pain.  EXAM: CHEST  2 VIEW  COMPARISON:  Chest radiograph performed 11/29/2008  FINDINGS: The lungs are hyperexpanded, with flattening of the hemidiaphragms, suggesting COPD. Minimal left basilar atelectasis or scarring is noted. There appears to be a spiculated 1.4 cm nodule at the right lung apex, though this could reflect overlying osseous structures. There is no evidence of pleural effusion or pneumothorax.  The heart is normal in size; the mediastinal contour is within normal limits. No acute osseous abnormalities are seen. There is stable chronic  deformity of the proximal left humerus. Clips are noted within the right upper quadrant, reflecting prior cholecystectomy.  IMPRESSION: 1. Apparent spiculated 1.4 cm nodule at the right lung apex, though this could reflect overlying osseous structures. CT of the chest would be helpful for further evaluation, on an elective nonemergent basis.  2. Findings of COPD.   Electronically Signed   By: Roanna Raider M.D.   On: 11/21/2013 00:20   Dg Shoulder 1v Left  11/21/2013   CLINICAL DATA:  Altered mental status.  Left shoulder pain.  EXAM: LEFT SHOULDER - 1 VIEW  COMPARISON:  Left shoulder radiographs performed 06/26/2006  FINDINGS: There is stable chronic deformity of the left humeral head and neck, reflecting a remote impaction fracture. The left humeral head remains seated at the glenoid fossa. No new fractures are seen. The left acromioclavicular joint is grossly unremarkable in appearance. The visualized portions of the left lung appear clear. No significant soft tissue abnormalities are characterized on radiograph.  IMPRESSION: No evidence of acute fracture or dislocation. Stable chronic deformity of the left humeral head and neck.   Electronically Signed   By: Roanna Raider M.D.   On: 11/21/2013 00:22   Dg Shoulder 1v Right  11/21/2013   CLINICAL DATA:  Altered mental status. Concern for right shoulder injury.  EXAM: RIGHT SHOULDER - 1 VIEW  COMPARISON:  None.  FINDINGS: There is no evidence of fracture or dislocation. The right humeral head is seated within the glenoid fossa. The acromioclavicular joint is unremarkable in appearance. No significant soft tissue abnormalities are seen. The visualized portions of the right lung are clear.  IMPRESSION: No evidence of fracture or dislocation.   Electronically Signed   By: Roanna Raider M.D.   On: 11/21/2013 00:22   Ct Head Wo Contrast  11/21/2013   CLINICAL DATA:  Status post fall. Scalp hematoma at the back of the head. Altered mental status, with garbled  speech. Concern for cervical spine injury.  EXAM: CT HEAD WITHOUT CONTRAST  CT CERVICAL SPINE WITHOUT CONTRAST  TECHNIQUE: Multidetector CT imaging of the head and cervical spine was performed following the standard protocol without intravenous contrast. Multiplanar CT image reconstructions of the cervical spine were also generated.  COMPARISON:  CT of the cervical spine performed 06/26/2006, and MRI of the brain performed 12/01/2008  FINDINGS: CT HEAD FINDINGS  There is no evidence of acute infarction, mass lesion, or intra- or extra-axial hemorrhage on CT.  Prominence of the ventricles and sulci reflects moderate cortical volume loss. This is similar in appearance to the prior MRI. Decreased white matter attenuation at the posterior lower lobes raises question for mild subependymal resorption of CSF, and mild hydrocephalus cannot be excluded. Diffuse periventricular and subcortical white matter change likely reflects small vessel ischemic microangiopathy. The previously noted vascular lesion at the fourth ventricle is slightly increased in size, measuring 2.7 cm in craniocaudal dimension, with significant calcification. 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  The posterior fossa, including the cerebellum, brainstem and fourth ventricle, is within normal limits. The third and lateral ventricles, and basal ganglia are unremarkable in appearance. The cerebral hemispheres are symmetric in appearance, with normal gray-white differentiation. No mass effect or midline shift is seen.  There is no evidence of fracture; visualized osseous structures are unremarkable in appearance. The orbits are within normal limits. The paranasal sinuses and mastoid air cells are well-aerated. Soft tissue swelling is noted at the occiput.  CT CERVICAL SPINE FINDINGS  There is no evidence of acute fracture or subluxation. Vertebral bodies demonstrate normal height. There is minimal grade 1 retrolisthesis of  C3 on C4, and multilevel disc space narrowing is noted at C4-C7, with associated anterior and posterior disc osteophyte complexes. Prevertebral soft tissues are within normal limits.  The thyroid  gland is unremarkable in appearance. Minimal emphysematous change is suggested at the lung apices. Dense calcification is noted at the carotid bifurcations bilaterally, with likely associated mild to moderate bilateral luminal narrowing.  IMPRESSION: 1. No evidence of traumatic intracranial injury or fracture. 2. No evidence of acute fracture or subluxation along the cervical spine. 3. Degenerative change noted along the cervical spine. 4. Moderate cortical volume loss and diffuse small vessel ischemic microangiopathy. 5. Decreased white matter attenuation at the posterior parietal lobes raises question for mild subependymal resorption of CSF; given the patient's chronic vascular lesion at the fourth ventricle, mild hydrocephalus cannot be excluded. However, ventricular size is stable from the MRI performed in 2010. 6. The vascular lesion at the fourth ventricle has mildly increased in size, measuring 2.7 cm in craniocaudal dimension, in comparison to 2.4 cm in 2010. As before, this may reflect a benign choroid plexus tumor or vascular malformation. 7. Soft tissue swelling noted at the occiput. 8. Dense calcification at the carotid bifurcations bilaterally, with likely associated mild to moderate bilateral foraminal narrowing. Carotid ultrasound would be helpful for evaluation, when and as deemed clinically appropriate. 9. Minimal emphysematous change suggested at the lung apices.   Electronically Signed   By: Garald Balding M.D.   On: 11/21/2013 00:36   Ct Angio Chest Pe W/cm &/or Wo Cm  11/21/2013   CLINICAL DATA:  Pulmonary nodule noted on chest radiograph. Altered mental status. Left shoulder pain.  EXAM: CT ANGIOGRAPHY CHEST WITH CONTRAST  TECHNIQUE: Multidetector CT imaging of the chest was performed using the  standard protocol during bolus administration of intravenous contrast. Multiplanar CT image reconstructions and MIPs were obtained to evaluate the vascular anatomy.  CONTRAST:  60mL OMNIPAQUE IOHEXOL 350 MG/ML SOLN  COMPARISON:  Chest radiograph performed 11/20/2013  FINDINGS: There is no evidence of pulmonary embolus.  A mildly lobulated 1.2 cm nodule is noted at the posterior aspect of the right upper lobe. This demonstrates slight spiculation and pleural extension. Malignancy cannot be excluded.  Minimal bilateral atelectasis is noted. The lungs are otherwise clear. There is no evidence of significant focal consolidation, pleural effusion or pneumothorax.  Scattered coronary calcifications are seen. No mediastinal lymphadenopathy is seen. No pericardial effusion is identified. The great vessels are grossly unremarkable in appearance. Scattered calcification is seen along the aortic arch. No axillary lymphadenopathy is seen. The thyroid gland is unremarkable in appearance.  A 1.0 cm hypodensity is noted at the left hepatic lobe, possibly reflecting a small cyst. The visualized portions of the liver and spleen are otherwise unremarkable. The patient is status post cholecystectomy, with clips noted along the gallbladder fossa. The visualized portions of the pancreas and kidneys are within normal limits. There is mild bilateral prominence of the adrenal glands, raising concern for mild adrenal hyperplasia. There is mild aneurysmal dilatation of the infrarenal abdominal aorta, measuring up to 3.2 cm in AP dimension, with associated mural thrombus.  No acute osseous abnormalities are seen. There is mild chronic compression deformity involving vertebral bodies T7 and L1.  Review of the MIP images confirms the above findings.  IMPRESSION: 1. No evidence of pulmonary embolus. 2. 1.2 cm mildly lobulated nodule at the posterior aspect of the right upper lung lobe. This demonstrates slight spiculation and pleural extension.  Malignancy cannot be excluded. PET/CT could be considered for further evaluation, or alternatively, tissue diagnosis could be considered. 3. Minimal bilateral atelectasis noted; lungs otherwise clear. 4. Scattered coronary artery calcifications seen. 5. Possible small hepatic cyst. 6.  Mild aneurysmal dilatation of the infrarenal abdominal aorta, measuring up to 3.2 cm in AP dimension, with associated mural thrombus. 7. Mild bilateral adrenal hyperplasia. 8. Mild chronic compression deformity involving vertebral bodies T7 and L1.   Electronically Signed   By: Roanna Raider M.D.   On: 11/21/2013 04:10   Ct Cervical Spine Wo Contrast  11/21/2013   CLINICAL DATA:  Status post fall. Scalp hematoma at the back of the head. Altered mental status, with garbled speech. Concern for cervical spine injury.  EXAM: CT HEAD WITHOUT CONTRAST  CT CERVICAL SPINE WITHOUT CONTRAST  TECHNIQUE: Multidetector CT imaging of the head and cervical spine was performed following the standard protocol without intravenous contrast. Multiplanar CT image reconstructions of the cervical spine were also generated.  COMPARISON:  CT of the cervical spine performed 06/26/2006, and MRI of the brain performed 12/01/2008  FINDINGS: CT HEAD FINDINGS  There is no evidence of acute infarction, mass lesion, or intra- or extra-axial hemorrhage on CT.  Prominence of the ventricles and sulci reflects moderate cortical volume loss. This is similar in appearance to the prior MRI. Decreased white matter attenuation at the posterior lower lobes raises question for mild subependymal resorption of CSF, and mild hydrocephalus cannot be excluded. Diffuse periventricular and subcortical white matter change likely reflects small vessel ischemic microangiopathy. The previously noted vascular lesion at the fourth ventricle is slightly increased in size, measuring 2.7 cm in craniocaudal dimension, with significant calcification. This may reflect a benign choroid plexus  tumor or vascular malformation.  The posterior fossa, including the cerebellum, brainstem and fourth ventricle, is within normal limits. The third and lateral ventricles, and basal ganglia are unremarkable in appearance. The cerebral hemispheres are symmetric in appearance, with normal gray-white differentiation. No mass effect or midline shift is seen.  There is no evidence of fracture; visualized osseous structures are unremarkable in appearance. The orbits are within normal limits. The paranasal sinuses and mastoid air cells are well-aerated. Soft tissue swelling is noted at the occiput.  CT CERVICAL SPINE FINDINGS  There is no evidence of acute fracture or subluxation. Vertebral bodies demonstrate normal height. There is minimal grade 1 retrolisthesis of C3 on C4, and multilevel disc space narrowing is noted at C4-C7, with associated anterior and posterior disc osteophyte complexes. Prevertebral soft tissues are within normal limits.  The thyroid gland is unremarkable in appearance. Minimal emphysematous change is suggested at the lung apices. Dense calcification is noted at the carotid bifurcations bilaterally, with likely associated mild to moderate bilateral luminal narrowing.  IMPRESSION: 1. No evidence of traumatic intracranial injury or fracture. 2. No evidence of acute fracture or subluxation along the cervical spine. 3. Degenerative change noted along the cervical spine. 4. Moderate cortical volume loss and diffuse small vessel ischemic microangiopathy. 5. Decreased white matter attenuation at the posterior parietal lobes raises question for mild subependymal resorption of CSF; given the patient's chronic vascular lesion at the fourth ventricle, mild hydrocephalus cannot be excluded. However, ventricular size is stable from the MRI performed in 2010. 6. The vascular lesion at the fourth ventricle has mildly increased in size, measuring 2.7 cm in craniocaudal dimension, in comparison to 2.4 cm in 2010. As  before, this may reflect a benign choroid plexus tumor or vascular malformation. 7. Soft tissue swelling noted at the occiput. 8. Dense calcification at the carotid bifurcations bilaterally, with likely associated mild to moderate bilateral foraminal narrowing. Carotid ultrasound would be helpful for evaluation, when and as deemed clinically appropriate. 9. Minimal  emphysematous change suggested at the lung apices.   Electronically Signed   By: Garald Balding M.D.   On: 11/21/2013 00:36   Mri Brain Without Contrast  11/21/2013   CLINICAL DATA:  Fall.  Trauma to back of head.  EXAM: MRI HEAD WITHOUT CONTRAST  TECHNIQUE: Multiplanar, multiecho pulse sequences of the brain and surrounding structures were obtained without intravenous contrast.  COMPARISON:  CT head from the same day.  MRI brain 12/01/2008.  FINDINGS: A broad-based posterior scalp hematoma is evident. No acute infarct, hemorrhage, or mass lesion is present. The ventricles has increased in size since the prior exam measured both across the frontal horns and at the atrium of the lateral ventricles. There is increased T2 hyperintensity surrounding the ventricles is well third ventricle is slightly larger than on the prior study. The fourth ventricle is slightly more prominent on the sagittal views is well. The aqueduct of Sylvius appears distended. .  Lesion along the inferior vermis is stable in size. 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 Previous postcontrast studies demonstrates significant enhancement in the area. The overall size lesion is stable.  Remote lacunar infarcts of the basal ganglia and right cerebellum are stable. Two additional lacunar infarcts are present in the right basal ganglia which are not acute.  Flow is present in the major intracranial arteries. The patient is status post bilateral lens replacements. The paranasal sinuses and mastoid air cells are clear.  IMPRESSION: 1.  Broad-based posterior scalp hematoma. 2. No evidence for acute intracranial hemorrhage or significant extra-axial fluid. 3. Increasing size of the ventricular system with worsening hydrocephalus. This is likely secondary to partial outlet obstruction at the level of the inferior fourth ventricle. Increased T2 signal surrounding the ventricle likely represents transependymal CSF flow. 4. 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   Electronically Signed   By: Lawrence Santiago M.D.   On: 11/21/2013 09:18    Review of Systems  Constitutional: Negative.   HENT: Negative.   Eyes: Negative.   Respiratory: Negative.   Cardiovascular: Negative.   Gastrointestinal: Negative.   Genitourinary: Negative.   Musculoskeletal: Negative.   Skin: Negative.   Neurological: Negative.   Endo/Heme/Allergies: Negative.   Psychiatric/Behavioral: Negative.    Blood pressure 162/62, pulse 82, temperature 98.1 F (36.7 C), temperature source Oral, resp. rate 18, height $RemoveBe'5\' 1"'JENwVzqae$  (1.549 m), weight 51.755 kg (114 lb 1.6 oz), SpO2 98.00%. Physical Exam  Constitutional: She appears well-developed and well-nourished.  HENT:  Head: Normocephalic and atraumatic.  Eyes: Conjunctivae and EOM are normal. Pupils are equal, round, and reactive to light.  Neck: Normal range of motion.  GI: Soft. Bowel sounds are normal.  Musculoskeletal: Normal range of motion.  Neurological:  Alert and aware that that she is in the hospital. She however denies any problem with her memory or denies any episodes of confusion. She believes she was monitored. She does not recall being found on the side of the road. She is aware that she has a tumor of her brain. She feels that she would be just fine he would leave her alone. Does not render good insight into the diagnosis of hydrocephalus.  Skin: Skin is warm and dry.   Psychiatric: She has a normal mood and affect.  Insight into her condition and her medical problems is very poor and limited  Assessment/Plan: Known fourth ventricular tumor. Early hydrocephalus apparent on the current MRI  The patient is reluctant to discuss consideration of any intervention for this process. If patient is deemed to be competent to make decisions in her best interest then she may not want any intervention as she explains currently.She should consider ventriculoperitoneal shunting to help alleviate hydrocephalus. And this opportunity could be discussed with her guardian or family member who may be assigned to make decisions on her behalf if she is deemed to be incompetent.  [name] 11/21/2013, 7:48 PM

## 2013-11-21 NOTE — Procedures (Signed)
ELECTROENCEPHALOGRAM REPORT  Patient: [name]       Room #: [id number] EEG No. ID: [id number] Age: 79 y.o.        Sex: female Referring Physician: Dillard Essex Report Date:  11/21/2013        Interpreting Physician: Wallie Char  History: [name] is an 79 y.o. female with a history of hypertension, hyperlipidemia and coronary artery disease who was admitted for altered mental status after being found approximately 1 mile from her house confused with memory she got there. Patient had signs of closed head trauma with scalp hematoma. 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  Indications for study:  Rule out slowing of cerebral activity; rule out new-onset seizure disorder.  Technique: This is an 18 channel routine scalp EEG performed at the bedside with bipolar and monopolar montages arranged in accordance to the international 10/20 system of electrode placement.   Description: This EEG study was performed during drowsiness and during sleep. Predominate background activity during drowsiness consistent of mild generalized slowing with low amplitude diffuse irregular delta and theta activity. Brief runs of 8-9 Hz alpha rhythm were recorded from the posterior head regions. Photic stimulation produced a minimal bilateral occipital driving response. There was more pronounced slowing of background activity during sleep with moderate amplitude irregular delta and theta activity diffusely as well as symmetrical vertex waves, sleep spindles and arousal responses during stage II of sleep. No epileptiform discharges occurred during drowsiness nor during sleep.  Interpretation: This is a normal EEG recording during drowsiness and during sleep. No evidence of an underlying encephalopathic process nor evidence of new-onset seizure disorder was demonstrated.   Rush Farmer M.D. [facility] [phone]

## 2013-11-21 NOTE — H&P (Signed)
[facility] History and Physical  [name] NWG:[id number] DOB: [birth year] DOA: 11/20/2013  Referring physician: ER physician. PCP: No PCP Per Patient   Chief Complaint: Confusion.  HPI: [name] is a 79 y.o. female with history of CAD status post stenting, hypertension, hyperlipidemia was found on the road by a bystander and was brought to the ER by the EMS. Patient was found on the road 1 mile away from her house. Patient states that she remembers going for a walk but does not recall what happened after that. The next thing she can remember is that she was brought to the hospital by EMS. Patient had mild hematoma on scalp. CT head and neck did not show anything acute. Chest x-ray shows possible spiculated lesion. EKG and cardiac markers were unremarkable. When patient was brought to the ER initially patient was confused and also was hallucinating. As per the ER physician patient was talking about people watching her. On my exam patient presently is alert awake oriented to time place and person and moves all extremities. Has mild headache. Denies any chest pain shortness of breath nausea vomiting difficulty speaking swallowing or any visual symptoms. Denies any incontinence of urine or tongue bite. 3 years ago patient did have an episode of psychosis. Today patient urine shows possible UTI.  Review of Systems: As presented in the history of presenting illness, rest negative.  Past Medical History  Diagnosis Date  . Hyperlipidemia   . Essential hypertension, benign   . CAD (coronary artery disease)   . Osteoporosis   . Hypercalcemia   . Vitamin D deficiency   . Ovarian tumor   . Breast tumor   . MI (myocardial infarction) July 1995    Treated with LAD PCI- Dr Adora Fridge  . Cataract    Past Surgical History  Procedure Laterality Date  . Angioplasty    . Hip pinning    . Breast lumpectomy    . Abdominal hysterectomy    . Cholecystectomy    . Fracture surgery   Nov 2003    L distal radius fx- Dr. Frederik Pear  . Eye surgery      Cataract removal   Social History:  reports that she has been smoking Cigarettes.  She has been smoking about 0.00 packs per day. She does not have any smokeless tobacco history on file. She reports that she does not drink alcohol or use illicit drugs. Where does patient live home. Can patient participate in ADLs? Yes.  Allergies  Allergen Reactions  . Sulfa Antibiotics Swelling  . Codeine Nausea And Vomiting  . Other Nausea And Vomiting    Some type of pill given for allergic reaction     Family History:  Family History  Problem Relation Age of Onset  . Stroke Mother   . Heart disease Father   . Cancer Sister       Prior to Admission medications   Medication Sig Start Date End Date Taking? Authorizing Provider  aspirin 81 MG tablet Take 81 mg by mouth daily.   Yes Historical Provider, MD  calcium citrate (CALCITRATE - DOSED IN MG ELEMENTAL CALCIUM) 950 MG tablet Take 1-2 tablets by mouth daily. 400 mg   Yes Historical Provider, MD  cholecalciferol (VITAMIN D) 1000 UNITS tablet Take 2,000 Units by mouth 2 (two) times daily.    Yes Historical Provider, MD  ezetimibe (ZETIA) 10 MG tablet Take 1 tablet (10mg  total) by mouth daily. <MUST KEEP APPOINTMENT> 08/23/13  Yes  Lorretta Harp, MD  loratadine (CLARITIN) 10 MG tablet Take 10 mg by mouth daily.   Yes Historical Provider, MD  losartan (COZAAR) 100 MG tablet Take 1 tablet (100 mg total) by mouth daily. Take 1 tablet (100mg  total) by mouth daily. <MUST KEEP APPOINTMENT> 09/25/13  Yes Lorretta Harp, MD  metoprolol succinate (TOPROL-XL) 50 MG 24 hr tablet Take 1.5 tablets (75mg  total) by mouth daily. 09/25/13  Yes Lorretta Harp, MD  Polyethyl Glycol-Propyl Glycol (SYSTANE OP) Place 1 drop into both eyes 2 (two) times daily as needed (dry eyes).   Yes Historical Provider, MD  pravastatin (PRAVACHOL) 40 MG tablet Take 1 tablet (40 mg total) by mouth daily. 09/25/13  Yes  Lorretta Harp, MD    Physical Exam: Filed Vitals:   11/21/13 0030 11/21/13 0045 11/21/13 0113 11/21/13 0200  BP: 202/79 168/76 159/101 183/71  Pulse: 78 78 83 73  Temp:      TempSrc:      Resp: 16 20 17 18   Height:      Weight:      SpO2: 100% 97% 98% 99%     General:  Well-developed well-nourished.  Eyes: Anicteric no pallor.  ENT: No discharge from the ears eyes nose mouth.  Neck: No mass felt.  Cardiovascular: S1-S2 heard.  Respiratory: No rhonchi or crepitations.  Abdomen: Soft nontender bowel sounds present. No guarding or rigidity.  Skin: No rash.  Musculoskeletal: No edema.  Psychiatric: Appears normal at this time.  Neurologic: Alert awake oriented to time place and person. Moves all extremities.  Labs on Admission:  Basic Metabolic Panel:  Recent Labs Lab 11/20/13 2225  NA 145  K 4.6  CL 109  CO2 23  GLUCOSE 88  BUN 20  CREATININE 0.97  CALCIUM 10.9*   Liver Function Tests:  Recent Labs Lab 11/20/13 2225  AST 30  ALT 21  ALKPHOS 99  BILITOT 0.3  PROT 7.7  ALBUMIN 4.1   No results found for this basename: LIPASE, AMYLASE,  in the last 168 hours No results found for this basename: AMMONIA,  in the last 168 hours CBC:  Recent Labs Lab 11/20/13 2225  WBC 8.5  HGB 13.6  HCT 43.4  MCV 81.7  PLT 231   Cardiac Enzymes: No results found for this basename: CKTOTAL, CKMB, CKMBINDEX, TROPONINI,  in the last 168 hours  BNP (last 3 results) No results found for this basename: PROBNP,  in the last 8760 hours CBG:  Recent Labs Lab 11/21/13 0120  GLUCAP 103*    Radiological Exams on Admission: Dg Chest 2 View  11/21/2013   CLINICAL DATA:  Altered mental status.  Left shoulder pain.  EXAM: CHEST  2 VIEW  COMPARISON:  Chest radiograph performed 11/29/2008  FINDINGS: The lungs are hyperexpanded, with flattening of the hemidiaphragms, suggesting COPD. Minimal left basilar atelectasis or scarring is noted. There appears to be a  spiculated 1.4 cm nodule at the right lung apex, though this could reflect overlying osseous structures. There is no evidence of pleural effusion or pneumothorax.  The heart is normal in size; the mediastinal contour is within normal limits. No acute osseous abnormalities are seen. There is stable chronic deformity of the proximal left humerus. Clips are noted within the right upper quadrant, reflecting prior cholecystectomy.  IMPRESSION: 1. Apparent spiculated 1.4 cm nodule at the right lung apex, though this could reflect overlying osseous structures. CT of the chest would be helpful for further evaluation, on an elective  nonemergent basis. 2. Findings of COPD.   Electronically Signed   By: Garald Balding M.D.   On: 11/21/2013 00:20   Dg Shoulder 1v Left  11/21/2013   CLINICAL DATA:  Altered mental status.  Left shoulder pain.  EXAM: LEFT SHOULDER - 1 VIEW  COMPARISON:  Left shoulder radiographs performed 06/26/2006  FINDINGS: There is stable chronic deformity of the left humeral head and neck, reflecting a remote impaction fracture. The left humeral head remains seated at the glenoid fossa. No new fractures are seen. The left acromioclavicular joint is grossly unremarkable in appearance. The visualized portions of the left lung appear clear. No significant soft tissue abnormalities are characterized on radiograph.  IMPRESSION: No evidence of acute fracture or dislocation. Stable chronic deformity of the left humeral head and neck.   Electronically Signed   By: Garald Balding M.D.   On: 11/21/2013 00:22   Dg Shoulder 1v Right  11/21/2013   CLINICAL DATA:  Altered mental status. Concern for right shoulder injury.  EXAM: RIGHT SHOULDER - 1 VIEW  COMPARISON:  None.  FINDINGS: There is no evidence of fracture or dislocation. The right humeral head is seated within the glenoid fossa. The acromioclavicular joint is unremarkable in appearance. No significant soft tissue abnormalities are seen. The visualized  portions of the right lung are clear.  IMPRESSION: No evidence of fracture or dislocation.   Electronically Signed   By: Garald Balding M.D.   On: 11/21/2013 00:22   Ct Head Wo Contrast  11/21/2013   CLINICAL DATA:  Status post fall. Scalp hematoma at the back of the head. Altered mental status, with garbled speech. Concern for cervical spine injury.  EXAM: CT HEAD WITHOUT CONTRAST  CT CERVICAL SPINE WITHOUT CONTRAST  TECHNIQUE: Multidetector CT imaging of the head and cervical spine was performed following the standard protocol without intravenous contrast. Multiplanar CT image reconstructions of the cervical spine were also generated.  COMPARISON:  CT of the cervical spine performed 06/26/2006, and MRI of the brain performed 12/01/2008  FINDINGS: CT HEAD FINDINGS  There is no evidence of acute infarction, mass lesion, or intra- or extra-axial hemorrhage on CT.  Prominence of the ventricles and sulci reflects moderate cortical volume loss. This is similar in appearance to the prior MRI. Decreased white matter attenuation at the posterior lower lobes raises question for mild subependymal resorption of CSF, and mild hydrocephalus cannot be excluded. Diffuse periventricular and subcortical white matter change likely reflects small vessel ischemic microangiopathy. The previously noted vascular lesion at the fourth ventricle is slightly increased in size, measuring 2.7 cm in craniocaudal dimension, with significant calcification. 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  The posterior fossa, including the cerebellum, brainstem and fourth ventricle, is within normal limits. The third and lateral ventricles, and basal ganglia are unremarkable in appearance. The cerebral hemispheres are symmetric in appearance, with normal gray-white differentiation. No mass effect or midline shift is seen.  There is no evidence of fracture; visualized osseous structures are unremarkable in appearance.  The orbits are within normal limits. The paranasal sinuses and mastoid air cells are well-aerated. Soft tissue swelling is noted at the occiput.  CT CERVICAL SPINE FINDINGS  There is no evidence of acute fracture or subluxation. Vertebral bodies demonstrate normal height. There is minimal grade 1 retrolisthesis of C3 on C4, and multilevel disc space narrowing is noted at C4-C7, with associated anterior and posterior disc osteophyte complexes. Prevertebral soft tissues are within normal limits.  The thyroid gland is unremarkable in appearance. Minimal emphysematous change is suggested at the lung apices. Dense calcification is noted at the carotid bifurcations bilaterally, with likely associated mild to moderate bilateral luminal narrowing.  IMPRESSION: 1. No evidence of traumatic intracranial injury or fracture. 2. No evidence of acute fracture or subluxation along the cervical spine. 3. Degenerative change noted along the cervical spine. 4. Moderate cortical volume loss and diffuse small vessel ischemic microangiopathy. 5. Decreased white matter attenuation at the posterior parietal lobes raises question for mild subependymal resorption of CSF; given the patient's chronic vascular lesion at the fourth ventricle, mild hydrocephalus cannot be excluded. However, ventricular size is stable from the MRI performed in 2010. 6. The vascular lesion at the fourth ventricle has mildly increased in size, measuring 2.7 cm in craniocaudal dimension, in comparison to 2.4 cm in 2010. As before, this may reflect a benign choroid plexus tumor or vascular malformation. 7. Soft tissue swelling noted at the occiput. 8. Dense calcification at the carotid bifurcations bilaterally, with likely associated mild to moderate bilateral foraminal narrowing. Carotid ultrasound would be helpful for evaluation, when and as deemed clinically appropriate. 9. Minimal emphysematous change suggested at the lung apices.   Electronically Signed   By:  Roanna Raider M.D.   On: 11/21/2013 00:36   Ct Cervical Spine Wo Contrast  11/21/2013   CLINICAL DATA:  Status post fall. Scalp hematoma at the back of the head. Altered mental status, with garbled speech. Concern for cervical spine injury.  EXAM: CT HEAD WITHOUT CONTRAST  CT CERVICAL SPINE WITHOUT CONTRAST  TECHNIQUE: Multidetector CT imaging of the head and cervical spine was performed following the standard protocol without intravenous contrast. Multiplanar CT image reconstructions of the cervical spine were also generated.  COMPARISON:  CT of the cervical spine performed 06/26/2006, and MRI of the brain performed 12/01/2008  FINDINGS: CT HEAD FINDINGS  There is no evidence of acute infarction, mass lesion, or intra- or extra-axial hemorrhage on CT.  Prominence of the ventricles and sulci reflects moderate cortical volume loss. This is similar in appearance to the prior MRI. Decreased white matter attenuation at the posterior lower lobes raises question for mild subependymal resorption of CSF, and mild hydrocephalus cannot be excluded. Diffuse periventricular and subcortical white matter change likely reflects small vessel ischemic microangiopathy. The previously noted vascular lesion at the fourth ventricle is slightly increased in size, measuring 2.7 cm in craniocaudal dimension, with significant calcification. 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  The posterior fossa, including the cerebellum, brainstem and fourth ventricle, is within normal limits. The third and lateral ventricles, and basal ganglia are unremarkable in appearance. The cerebral hemispheres are symmetric in appearance, with normal gray-white differentiation. No mass effect or midline shift is seen.  There is no evidence of fracture; visualized osseous structures are unremarkable in appearance. The orbits are within normal limits. The paranasal sinuses and mastoid air cells are well-aerated. Soft tissue  swelling is noted at the occiput.  CT CERVICAL SPINE FINDINGS  There is no evidence of acute fracture or subluxation. Vertebral bodies demonstrate normal height. There is minimal grade 1 retrolisthesis of C3 on C4, and multilevel disc space narrowing is noted at C4-C7, with associated anterior and posterior disc osteophyte complexes. Prevertebral soft tissues are within normal limits.  The thyroid gland is unremarkable in appearance. Minimal emphysematous change is suggested at the lung apices. Dense calcification is noted at the carotid bifurcations bilaterally, with likely associated mild  to moderate bilateral luminal narrowing.  IMPRESSION: 1. No evidence of traumatic intracranial injury or fracture. 2. No evidence of acute fracture or subluxation along the cervical spine. 3. Degenerative change noted along the cervical spine. 4. Moderate cortical volume loss and diffuse small vessel ischemic microangiopathy. 5. Decreased white matter attenuation at the posterior parietal lobes raises question for mild subependymal resorption of CSF; given the patient's chronic vascular lesion at the fourth ventricle, mild hydrocephalus cannot be excluded. However, ventricular size is stable from the MRI performed in 2010. 6. The vascular lesion at the fourth ventricle has mildly increased in size, measuring 2.7 cm in craniocaudal dimension, in comparison to 2.4 cm in 2010. As before, this may reflect a benign choroid plexus tumor or vascular malformation. 7. Soft tissue swelling noted at the occiput. 8. Dense calcification at the carotid bifurcations bilaterally, with likely associated mild to moderate bilateral foraminal narrowing. Carotid ultrasound would be helpful for evaluation, when and as deemed clinically appropriate. 9. Minimal emphysematous change suggested at the lung apices.   Electronically Signed   By: Garald Balding M.D.   On: 11/21/2013 00:36    EKG: Independently reviewed. Normal sinus rhythm with nonspecific  ST-T changes in the anterior leads with short PR interval.  Assessment/Plan Principal Problem:   Syncope Active Problems:   HTN (hypertension), benign   CAD (coronary artery disease)   Hypercalcemia   Hyperlipidemia   1. Possible syncope - cause not clear. Given the abrupt onset of the symptom we will check for any arrhythmias by monitoring in telemetry and check MRI brain and EEG. Check 2-D echo. Cycle cardiac markers. 2. Abnormal chest x-ray - check CT chest. 3. Mild hypercalcemia - hold calcium tablets and vitamin D. Check levels of vitamin D and parathormone. Gently hydrate. Recheck calcium in a.m. 4. Low TSH - check free T3 and T4. 5. Hypertension - continue present medication. 6. CAD - denies any chest pain. 7. Hyperlipidemia - continue present medication. 8. Tobacco abuse - cessation counseling requested.  Initially when patient presented patient was confused and had some hallucination. On my exam patient is an is alert awake oriented. Closely observe. Of  Code Status: Full code.  Family Communication: None.  Disposition Plan: Admit for observation.    [facility] Pager [phone].  If 7PM-7AM, please contact night-coverage [email or web address] Password [email or web address] 11/21/2013, 2:17 AM

## 2013-11-21 NOTE — Progress Notes (Signed)
Called for report. Was told I would be called back

## 2013-11-21 NOTE — Consult Note (Signed)
Name: [name] MRN: [id number] DOB: Nov 11, 1934    ADMISSION DATE:  11/20/2013 CONSULTATION DATE:  11/21/13  REFERRING MD :  Hal Hope  PRIMARY SERVICE:  [facility]   CHIEF COMPLAINT:  Lung nodule   BRIEF PATIENT DESCRIPTION: 79yo female active smoker with hx CAD, HTN found on the road by bystander, 1 mile from her home, confused.  Pt states she remembers leaving to go for a walk but does not remember what happened after that. Confused and hallucinating in ER.  Has hx of psychotic episode approx 3 years ago. CT head essentially negative.  Admitted by [facility] with ?UTI.  CTA chest with RUL spiculated nodule and PCCM consulted.   SIGNIFICANT EVENTS / STUDIES:  CTA chest 5/27>>>mildly lobulated 1.2 cm nodule is noted at the posterior aspect of the right upper lobe. This demonstrates slight spiculation and pleural extension.  Possible small hepatic cyst. CT head/neck 5/27>>>neg acute. The vascular lesion at the fourth ventricle has mildly increased in size, measuring 2.7 cm in craniocaudal dimension, in comparison to 2.4 cm in 2010. As before, this may reflect a benign choroid plexus tumor or vascular malformation MRI brain 5/28>>>Broad-based posterior scalp hematoma. No evidence for acute intracranial hemorrhage or significant extra-axial fluid. Increasing size of the ventricular system with worsening hydrocephalus. This is likely secondary to partial outlet obstruction at the level of the inferior fourth ventricle. Increased T2 signal surrounding the ventricle likely represents transependymal CSF flow. 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   LINES / TUBES:   CULTURES: Urine 5/27>>>  ANTIBIOTICS: Rocephin 5/27>>>  HISTORY OF PRESENT ILLNESS: 79yo female active smoker with hx CAD, HTN found on the road by bystander, 1 mile from her home, confused.   Pt states she remembers leaving to go for a walk but does not remember what happened after that. CT head essentially negative.  Admitted by [facility] with ?UTI.  CTA chest with RUL nodule and PCCM consulted.    PAST MEDICAL HISTORY :  Past Medical History  Diagnosis Date  . Hyperlipidemia   . Essential hypertension, benign   . CAD (coronary artery disease)   . Osteoporosis   . Hypercalcemia   . Vitamin D deficiency   . Ovarian tumor   . Breast tumor   . MI (myocardial infarction) July 1995    Treated with LAD PCI- Dr Adora Fridge  . Cataract    Past Surgical History  Procedure Laterality Date  . Angioplasty    . Hip pinning    . Breast lumpectomy    . Abdominal hysterectomy    . Cholecystectomy    . Fracture surgery  Nov 2003    L distal radius fx- Dr. Frederik Pear  . Eye surgery      Cataract removal   Prior to Admission medications   Medication Sig Start Date End Date Taking? Authorizing Provider  aspirin 81 MG tablet Take 81 mg by mouth daily.   Yes Historical Provider, MD  calcium citrate (CALCITRATE - DOSED IN MG ELEMENTAL CALCIUM) 950 MG tablet Take 1-2 tablets by mouth daily. 400 mg   Yes Historical Provider, MD  cholecalciferol (VITAMIN D) 1000 UNITS tablet Take 2,000 Units by mouth 2 (two) times daily.    Yes Historical Provider, MD  ezetimibe (ZETIA) 10 MG tablet Take 1 tablet (10mg  total) by mouth daily. <MUST KEEP APPOINTMENT> 08/23/13  Yes Lorretta Harp,  MD  loratadine (CLARITIN) 10 MG tablet Take 10 mg by mouth daily.   Yes Historical Provider, MD  losartan (COZAAR) 100 MG tablet Take 1 tablet (100 mg total) by mouth daily. Take 1 tablet (100mg  total) by mouth daily. <MUST KEEP APPOINTMENT> 09/25/13  Yes Lorretta Harp, MD  metoprolol succinate (TOPROL-XL) 50 MG 24 hr tablet Take 1.5 tablets (75mg  total) by mouth daily. 09/25/13  Yes Lorretta Harp, MD  Polyethyl Glycol-Propyl Glycol (SYSTANE OP) Place 1 drop into both eyes 2 (two) times daily as needed (dry eyes).   Yes  Historical Provider, MD  pravastatin (PRAVACHOL) 40 MG tablet Take 1 tablet (40 mg total) by mouth daily. 09/25/13  Yes Lorretta Harp, MD   Allergies  Allergen Reactions  . Sulfa Antibiotics Swelling  . Codeine Nausea And Vomiting  . Other Nausea And Vomiting    Some type of pill given for allergic reaction     FAMILY HISTORY:  Family History  Problem Relation Age of Onset  . Stroke Mother   . Heart disease Father   . Cancer Sister    SOCIAL HISTORY:  reports that she has been smoking Cigarettes.  She has been smoking about 0.00 packs per day. She does not have any smokeless tobacco history on file. She reports that she does not drink alcohol or use illicit drugs.  REVIEW OF SYSTEMS:   As per HPI - All other systems reviewed and were neg.    VITAL SIGNS: Temp:  [98.1 F (36.7 C)-98.6 F (37 C)] 98.2 F (36.8 C) (05/28 0941) Pulse Rate:  [73-86] 85 (05/28 0941) Resp:  [16-21] 18 (05/28 0941) BP: (159-211)/(69-101) 182/74 mmHg (05/28 0941) SpO2:  [97 %-100 %] 99 % (05/28 0941) Weight:  [114 lb 1.6 oz (51.755 kg)-117 lb (53.071 kg)] 114 lb 1.6 oz (51.755 kg) (05/28 0513)  PHYSICAL EXAMINATION: Deferred as pt refusing exam   Recent Labs Lab 11/20/13 2225 11/21/13 0428  NA 145  --   K 4.6  --   CL 109  --   CO2 23  --   BUN 20  --   CREATININE 0.97 0.86  GLUCOSE 88  --     Recent Labs Lab 11/20/13 2225 11/21/13 0428  HGB 13.6 12.2  HCT 43.4 38.5  WBC 8.5 7.8  PLT 231 200   Dg Chest 2 View  11/21/2013   CLINICAL DATA:  Altered mental status.  Left shoulder pain.  EXAM: CHEST  2 VIEW  COMPARISON:  Chest radiograph performed 11/29/2008  FINDINGS: The lungs are hyperexpanded, with flattening of the hemidiaphragms, suggesting COPD. Minimal left basilar atelectasis or scarring is noted. There appears to be a spiculated 1.4 cm nodule at the right lung apex, though this could reflect overlying osseous structures. There is no evidence of pleural effusion or  pneumothorax.  The heart is normal in size; the mediastinal contour is within normal limits. No acute osseous abnormalities are seen. There is stable chronic deformity of the proximal left humerus. Clips are noted within the right upper quadrant, reflecting prior cholecystectomy.  IMPRESSION: 1. Apparent spiculated 1.4 cm nodule at the right lung apex, though this could reflect overlying osseous structures. CT of the chest would be helpful for further evaluation, on an elective nonemergent basis. 2. Findings of COPD.   Electronically Signed   By: Garald Balding M.D.   On: 11/21/2013 00:20   Dg Shoulder 1v Left  11/21/2013   CLINICAL DATA:  Altered mental status.  Left  shoulder pain.  EXAM: LEFT SHOULDER - 1 VIEW  COMPARISON:  Left shoulder radiographs performed 06/26/2006  FINDINGS: There is stable chronic deformity of the left humeral head and neck, reflecting a remote impaction fracture. The left humeral head remains seated at the glenoid fossa. No new fractures are seen. The left acromioclavicular joint is grossly unremarkable in appearance. The visualized portions of the left lung appear clear. No significant soft tissue abnormalities are characterized on radiograph.  IMPRESSION: No evidence of acute fracture or dislocation. Stable chronic deformity of the left humeral head and neck.   Electronically Signed   By: Garald Balding M.D.   On: 11/21/2013 00:22   Dg Shoulder 1v Right  11/21/2013   CLINICAL DATA:  Altered mental status. Concern for right shoulder injury.  EXAM: RIGHT SHOULDER - 1 VIEW  COMPARISON:  None.  FINDINGS: There is no evidence of fracture or dislocation. The right humeral head is seated within the glenoid fossa. The acromioclavicular joint is unremarkable in appearance. No significant soft tissue abnormalities are seen. The visualized portions of the right lung are clear.  IMPRESSION: No evidence of fracture or dislocation.   Electronically Signed   By: Garald Balding M.D.   On:  11/21/2013 00:22   Ct Head Wo Contrast  11/21/2013   CLINICAL DATA:  Status post fall. Scalp hematoma at the back of the head. Altered mental status, with garbled speech. Concern for cervical spine injury.  EXAM: CT HEAD WITHOUT CONTRAST  CT CERVICAL SPINE WITHOUT CONTRAST  TECHNIQUE: Multidetector CT imaging of the head and cervical spine was performed following the standard protocol without intravenous contrast. Multiplanar CT image reconstructions of the cervical spine were also generated.  COMPARISON:  CT of the cervical spine performed 06/26/2006, and MRI of the brain performed 12/01/2008  FINDINGS: CT HEAD FINDINGS  There is no evidence of acute infarction, mass lesion, or intra- or extra-axial hemorrhage on CT.  Prominence of the ventricles and sulci reflects moderate cortical volume loss. This is similar in appearance to the prior MRI. Decreased white matter attenuation at the posterior lower lobes raises question for mild subependymal resorption of CSF, and mild hydrocephalus cannot be excluded. Diffuse periventricular and subcortical white matter change likely reflects small vessel ischemic microangiopathy. The previously noted vascular lesion at the fourth ventricle is slightly increased in size, measuring 2.7 cm in craniocaudal dimension, with significant calcification. 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  The posterior fossa, including the cerebellum, brainstem and fourth ventricle, is within normal limits. The third and lateral ventricles, and basal ganglia are unremarkable in appearance. The cerebral hemispheres are symmetric in appearance, with normal gray-white differentiation. No mass effect or midline shift is seen.  There is no evidence of fracture; visualized osseous structures are unremarkable in appearance. The orbits are within normal limits. The paranasal sinuses and mastoid air cells are well-aerated. Soft tissue swelling is noted at the occiput.  CT  CERVICAL SPINE FINDINGS  There is no evidence of acute fracture or subluxation. Vertebral bodies demonstrate normal height. There is minimal grade 1 retrolisthesis of C3 on C4, and multilevel disc space narrowing is noted at C4-C7, with associated anterior and posterior disc osteophyte complexes. Prevertebral soft tissues are within normal limits.  The thyroid gland is unremarkable in appearance. Minimal emphysematous change is suggested at the lung apices. Dense calcification is noted at the carotid bifurcations bilaterally, with likely associated mild to moderate bilateral luminal narrowing.  IMPRESSION: 1. No evidence of  traumatic intracranial injury or fracture. 2. No evidence of acute fracture or subluxation along the cervical spine. 3. Degenerative change noted along the cervical spine. 4. Moderate cortical volume loss and diffuse small vessel ischemic microangiopathy. 5. Decreased white matter attenuation at the posterior parietal lobes raises question for mild subependymal resorption of CSF; given the patient's chronic vascular lesion at the fourth ventricle, mild hydrocephalus cannot be excluded. However, ventricular size is stable from the MRI performed in 2010. 6. The vascular lesion at the fourth ventricle has mildly increased in size, measuring 2.7 cm in craniocaudal dimension, in comparison to 2.4 cm in 2010. As before, this may reflect a benign choroid plexus tumor or vascular malformation. 7. Soft tissue swelling noted at the occiput. 8. Dense calcification at the carotid bifurcations bilaterally, with likely associated mild to moderate bilateral foraminal narrowing. Carotid ultrasound would be helpful for evaluation, when and as deemed clinically appropriate. 9. Minimal emphysematous change suggested at the lung apices.   Electronically Signed   By: Roanna Raider M.D.   On: 11/21/2013 00:36   Ct Angio Chest Pe W/cm &/or Wo Cm  11/21/2013   CLINICAL DATA:  Pulmonary nodule noted on chest  radiograph. Altered mental status. Left shoulder pain.  EXAM: CT ANGIOGRAPHY CHEST WITH CONTRAST  TECHNIQUE: Multidetector CT imaging of the chest was performed using the standard protocol during bolus administration of intravenous contrast. Multiplanar CT image reconstructions and MIPs were obtained to evaluate the vascular anatomy.  CONTRAST:  39mL OMNIPAQUE IOHEXOL 350 MG/ML SOLN  COMPARISON:  Chest radiograph performed 11/20/2013  FINDINGS: There is no evidence of pulmonary embolus.  A mildly lobulated 1.2 cm nodule is noted at the posterior aspect of the right upper lobe. This demonstrates slight spiculation and pleural extension. Malignancy cannot be excluded.  Minimal bilateral atelectasis is noted. The lungs are otherwise clear. There is no evidence of significant focal consolidation, pleural effusion or pneumothorax.  Scattered coronary calcifications are seen. No mediastinal lymphadenopathy is seen. No pericardial effusion is identified. The great vessels are grossly unremarkable in appearance. Scattered calcification is seen along the aortic arch. No axillary lymphadenopathy is seen. The thyroid gland is unremarkable in appearance.  A 1.0 cm hypodensity is noted at the left hepatic lobe, possibly reflecting a small cyst. The visualized portions of the liver and spleen are otherwise unremarkable. The patient is status post cholecystectomy, with clips noted along the gallbladder fossa. The visualized portions of the pancreas and kidneys are within normal limits. There is mild bilateral prominence of the adrenal glands, raising concern for mild adrenal hyperplasia. There is mild aneurysmal dilatation of the infrarenal abdominal aorta, measuring up to 3.2 cm in AP dimension, with associated mural thrombus.  No acute osseous abnormalities are seen. There is mild chronic compression deformity involving vertebral bodies T7 and L1.  Review of the MIP images confirms the above findings.  IMPRESSION: 1. No evidence  of pulmonary embolus. 2. 1.2 cm mildly lobulated nodule at the posterior aspect of the right upper lung lobe. This demonstrates slight spiculation and pleural extension. Malignancy cannot be excluded. PET/CT could be considered for further evaluation, or alternatively, tissue diagnosis could be considered. 3. Minimal bilateral atelectasis noted; lungs otherwise clear. 4. Scattered coronary artery calcifications seen. 5. Possible small hepatic cyst. 6. Mild aneurysmal dilatation of the infrarenal abdominal aorta, measuring up to 3.2 cm in AP dimension, with associated mural thrombus. 7. Mild bilateral adrenal hyperplasia. 8. Mild chronic compression deformity involving vertebral bodies T7 and L1.  Electronically Signed   By: Garald Balding M.D.   On: 11/21/2013 04:10   Ct Cervical Spine Wo Contrast  11/21/2013   CLINICAL DATA:  Status post fall. Scalp hematoma at the back of the head. Altered mental status, with garbled speech. Concern for cervical spine injury.  EXAM: CT HEAD WITHOUT CONTRAST  CT CERVICAL SPINE WITHOUT CONTRAST  TECHNIQUE: Multidetector CT imaging of the head and cervical spine was performed following the standard protocol without intravenous contrast. Multiplanar CT image reconstructions of the cervical spine were also generated.  COMPARISON:  CT of the cervical spine performed 06/26/2006, and MRI of the brain performed 12/01/2008  FINDINGS: CT HEAD FINDINGS  There is no evidence of acute infarction, mass lesion, or intra- or extra-axial hemorrhage on CT.  Prominence of the ventricles and sulci reflects moderate cortical volume loss. This is similar in appearance to the prior MRI. Decreased white matter attenuation at the posterior lower lobes raises question for mild subependymal resorption of CSF, and mild hydrocephalus cannot be excluded. Diffuse periventricular and subcortical white matter change likely reflects small vessel ischemic microangiopathy. The previously noted vascular lesion at  the fourth ventricle is slightly increased in size, measuring 2.7 cm in craniocaudal dimension, with significant calcification. 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  The posterior fossa, including the cerebellum, brainstem and fourth ventricle, is within normal limits. The third and lateral ventricles, and basal ganglia are unremarkable in appearance. The cerebral hemispheres are symmetric in appearance, with normal gray-white differentiation. No mass effect or midline shift is seen.  There is no evidence of fracture; visualized osseous structures are unremarkable in appearance. The orbits are within normal limits. The paranasal sinuses and mastoid air cells are well-aerated. Soft tissue swelling is noted at the occiput.  CT CERVICAL SPINE FINDINGS  There is no evidence of acute fracture or subluxation. Vertebral bodies demonstrate normal height. There is minimal grade 1 retrolisthesis of C3 on C4, and multilevel disc space narrowing is noted at C4-C7, with associated anterior and posterior disc osteophyte complexes. Prevertebral soft tissues are within normal limits.  The thyroid gland is unremarkable in appearance. Minimal emphysematous change is suggested at the lung apices. Dense calcification is noted at the carotid bifurcations bilaterally, with likely associated mild to moderate bilateral luminal narrowing.  IMPRESSION: 1. No evidence of traumatic intracranial injury or fracture. 2. No evidence of acute fracture or subluxation along the cervical spine. 3. Degenerative change noted along the cervical spine. 4. Moderate cortical volume loss and diffuse small vessel ischemic microangiopathy. 5. Decreased white matter attenuation at the posterior parietal lobes raises question for mild subependymal resorption of CSF; given the patient's chronic vascular lesion at the fourth ventricle, mild hydrocephalus cannot be excluded. However, ventricular size is stable from the MRI  performed in 2010. 6. The vascular lesion at the fourth ventricle has mildly increased in size, measuring 2.7 cm in craniocaudal dimension, in comparison to 2.4 cm in 2010. As before, this may reflect a benign choroid plexus tumor or vascular malformation. 7. Soft tissue swelling noted at the occiput. 8. Dense calcification at the carotid bifurcations bilaterally, with likely associated mild to moderate bilateral foraminal narrowing. Carotid ultrasound would be helpful for evaluation, when and as deemed clinically appropriate. 9. Minimal emphysematous change suggested at the lung apices.   Electronically Signed   By: Garald Balding M.D.   On: 11/21/2013 00:36   Mri Brain Without Contrast  11/21/2013   CLINICAL DATA:  Fall.  Trauma to back of head.  EXAM: MRI HEAD WITHOUT CONTRAST  TECHNIQUE: Multiplanar, multiecho pulse sequences of the brain and surrounding structures were obtained without intravenous contrast.  COMPARISON:  CT head from the same day.  MRI brain 12/01/2008.  FINDINGS: A broad-based posterior scalp hematoma is evident. No acute infarct, hemorrhage, or mass lesion is present. The ventricles has increased in size since the prior exam measured both across the frontal horns and at the atrium of the lateral ventricles. There is increased T2 hyperintensity surrounding the ventricles is well third ventricle is slightly larger than on the prior study. The fourth ventricle is slightly more prominent on the sagittal views is well. The aqueduct of Sylvius appears distended. .  Lesion along the inferior vermis is stable in size. 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 Previous postcontrast studies demonstrates significant enhancement in the area. The overall size lesion is stable.  Remote lacunar infarcts of the basal ganglia and right cerebellum are stable. Two additional lacunar infarcts are present in the right basal ganglia which are not acute.  Flow is  present in the major intracranial arteries. The patient is status post bilateral lens replacements. The paranasal sinuses and mastoid air cells are clear.  IMPRESSION: 1. Broad-based posterior scalp hematoma. 2. No evidence for acute intracranial hemorrhage or significant extra-axial fluid. 3. Increasing size of the ventricular system with worsening hydrocephalus. This is likely secondary to partial outlet obstruction at the level of the inferior fourth ventricle. Increased T2 signal surrounding the ventricle likely represents transependymal CSF flow. 4. 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   Electronically Signed   By: Lawrence Santiago M.D.   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 The RUL nodule is indeterminate by CT criteria but in a smoker of her age carries a significant likelihood of being malignant. It is probably an incidental finding (and notably, does not correlate in location with the "nodule" that was believed to have been seen on CXR) which probably has nothing to do with the CNS mass and hydrocephalus. Therefore, there is no urgency to evaluate it @ this time. Given its size and location, it is not amenable to standard bronchoscopy. It might not be reachable by [facility]  As it is small and in a difficult location. ENB might be a consideration in the future if her condition improves enough to warrant further evaluation. Further eval could include PET scanning, serial limited CT scans of chest over time or ENB.   RECS: Take care of CNS issues as per Dr Clarice Pole recommendations If warranted, consider further eval of lung nodule as outpt with PET scanning, serial  limited CT scans of chest over time or ENB.  Re-eval by Pulmonary medicine as outpt would be appropriate IF pt wishes to pursue dx of possible lung malignancy  PCCM will sign off. Please call if we can be of further assistance  Merton Border, MD ; [facility] [phone].  After 5:30 PM or weekends, call [phone]   Seen with:  Nickolas Madrid, NP 11/21/2013  1:03 PM Pager: [phone] or [phone]

## 2013-11-21 NOTE — ED Provider Notes (Signed)
Medical screening examination/treatment/procedure(s) were conducted as a shared visit with resident-physician practitioner(s) and myself.  I personally evaluated the patient during the encounter.  Pt is a 79 y.o. female with pmhx as above presenting with AMS and posterior scalp hematoma after being found laying in the road.  She is in NAD on PE, pleasant.  Pt found to have delusions of persecution concerning for psychosis, but also UTI.  Will admit to [facility], suspect she will need psych consult.     EKG Interpretation  Date/Time:    Ventricular Rate:    PR Interval:    QRS Duration:   QT Interval:    QTC Calculation:   R Axis:     Text Interpretation:          Neta Ehlers, MD 11/21/13 [phone]

## 2013-11-21 NOTE — ED Notes (Addendum)
Pt back from xray/ CT/ pt alert, NAD, calm, interactive, resps e/u, no dyspnea noted, speaking in clear complete sentences, VSS, BP elevated, thinks she is in "[place] [facility] [place]" and that it is the 26[address].  Pt re-oriented. Reports HA only.

## 2013-11-21 NOTE — Progress Notes (Signed)
Patient's daughter [name] called and spoke to patient who passed the phone to me.  [name] says mother has been delusional over the years and has been getting worse.  Has seen a psychiatrist in the past.  Daughter [name] is coming to the hospital.

## 2013-11-21 NOTE — Progress Notes (Signed)
Patient arrived to 4N04 AAOx4 and pleasant. She was oriented to the room, tele placed and call bell by her side. Vital signs were taken and questions answered. Denies any pain at this time. Will continue to monitor. Rande Brunt Kelin Borum

## 2013-11-21 NOTE — Progress Notes (Signed)
Patient refusing medications and lab draws.  Kizzie Bane, RN

## 2013-11-21 NOTE — Progress Notes (Addendum)
[facility] PROGRESS NOTE  [name] BSW:[id number] DOB: 1935-02-06 DOA: 11/20/2013 PCP: No PCP Per Patient I have seen and examined pt who is a 79 yo admitted this am by Dr Hal Hope, with history of CAD status post stenting in the past, hypertension, hyperlipidemia who presented with confusion. CT scan of her head was done and showed no acute findings, prominence of her ventricles noted. A followup MRI was done and showed a broad-based posterior scalp hematoma , increasing size of the ventricular system with worsening hydrocephalus-likely due to partial outlet obstruction at the level of the inferior fourth ventricle. 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 She is alert and oriented x3 this morning. I have consulted neurosurgery and Dr. Ellene Route  to see patient. She also was found to have an abnormal chest x-ray and a CT angiogram showed a mildly lobulated 1.2 cm nodule in the posterior aspect of the right upper lobe, a 1 cm hypodensity noted in the left hepatic lobe but per radiology possibly reflecting a small cyst. I consulted pulmonology- Dr. Gar Gibbon and he recommends that the patient follows up outpatient with pulmonology for a PET scan and if positive would recommend wedge resection per CVTS.  She denies any complaints today we'll continue empiric antibiotics for probable UTI and current management plan as per Dr. Hal Hope, and follow      [facility] Pager [phone]. If 7PM-7AM, please contact night-coverage at [email or web address], password [facility] 11/21/2013, 12:01 PM  LOS: 1 day

## 2013-11-21 NOTE — Progress Notes (Signed)
UR completed. Patient changed to inpatient- requiring IVF and IV antibiotics.  

## 2013-11-21 NOTE — ED Notes (Signed)
Placed on BP to void, CT called for PE study, report called to 4N 04 primary receiving RN.

## 2013-11-22 DIAGNOSIS — F22 Delusional disorders: Secondary | ICD-10-CM

## 2013-11-22 DIAGNOSIS — R55 Syncope and collapse: Secondary | ICD-10-CM

## 2013-11-22 LAB — GLUCOSE, CAPILLARY: GLUCOSE-CAPILLARY: 106 mg/dL — AB (ref 70–99)

## 2013-11-22 LAB — PARATHYROID HORMONE, INTACT (NO CA): PTH: 68.9 pg/mL (ref 14.0–72.0)

## 2013-11-22 LAB — VITAMIN D 25 HYDROXY (VIT D DEFICIENCY, FRACTURES): VIT D 25 HYDROXY: 89 ng/mL (ref 30–89)

## 2013-11-22 MED ORDER — TRAZODONE HCL 50 MG PO TABS
50.0000 mg | ORAL_TABLET | Freq: Every day | ORAL | Status: DC
  Administered 2013-11-23 – 2013-11-24 (×2): 50 mg via ORAL
  Filled 2013-11-22 (×4): qty 1

## 2013-11-22 MED ORDER — RISPERIDONE 0.25 MG PO TABS
0.2500 mg | ORAL_TABLET | Freq: Two times a day (BID) | ORAL | Status: DC
  Administered 2013-11-23 – 2013-11-25 (×5): 0.25 mg via ORAL
  Filled 2013-11-22 (×7): qty 1
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
## 2013-11-22 NOTE — Progress Notes (Signed)
Patient is being non compliant with care. She refused her 0100 IV antibiotic and decided to go off telemetry.

## 2013-11-22 NOTE — Progress Notes (Signed)
Patient called this RN into room at approximately noon.  She said that I had not given her her morning medications.  I reminded patient that I had offered them to her at 1000 this morning and that she had refused them.  She asked me to bring her medications back.  I did so, the patient requested to inspect each medication one at a time and then proceeded to tell me that she was not going to take them again.  The sitter in the room was witness to these events.  Will continue to monitor patient.  Kizzie Bane, RN

## 2013-11-22 NOTE — Progress Notes (Signed)
Patient continues to refuse medications and care by RN-she is very uncooperative and unwilling to compromise on anything.  Kizzie Bane, RN

## 2013-11-22 NOTE — Progress Notes (Signed)
Patient refused 2200 medications and slammed the door closed before I had come into the room. Patient is now asleep and sitter is at the bedside. Will continue to monitor.

## 2013-11-22 NOTE — Progress Notes (Signed)
Nurse Tech came to this RN and said that patient refused her CBG check.  Kizzie Bane, RN

## 2013-11-22 NOTE — Progress Notes (Signed)
Patient ID: [name]  female  [id number]    DOB: 16-Feb-1935    DOA: 11/20/2013  PCP: No PCP Per Patient  Assessment/Plan: Principal Problem:   Syncope and acute encephalopathy:  - Possibly vasovagal episode, dehydration versus orthostasis, UTI - she is ruled out for acute ACS, CT angiogram of the chest negative for any PE, 2-D echo still pending.  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 However I have also formally consulted psychiatry for competency evaluation. Her daughter at the bedside is the health power of attorney.  Lung nodule - CT angiogram of the chest incidentally showed mildly low platelet 1.2 cm nodule, spiculated in the posterior aspect of the right upper lobe. Pulmonology was consulted, patient was seen by [name] and recommended outpatient management.    HTN (hypertension), benign: Elevated, - Patient is uncooperative and continues to refuse her medications     CAD (coronary artery disease): Currently stable, ruled out for acute ACS, 2-D echo pending    Hypercalcemia -Recheck BMET   Delusional with acute encephalopathy : Per patient's daughter, patient is very functional to manage her house, bills etc. however she is paranoid and constantly under paranoia that somebody is in her attic. I have called for psychiatry consult for this and competency evaluation.   DVT Prophylaxis:  Code Status: full code   Family  Communication: discussed in detail with patient's daughter at the bedside   Disposition:  Consultants:  Pulmonology    neurosurgery  Psychiatry  Procedures:  EEG   Antibiotics:  IV Rocephin     Subjective: Patient seen and examined, somewhat uncooperative, initially refused to be examined, later agreed. Patient continues to state "I am not crazy"    Objective: Weight change:  No intake or output data in the 24 hours ending 11/22/13 1332 Blood pressure 152/66, pulse 60, temperature 98.5 F (36.9 C), temperature source Oral, resp. rate 18, height 5\' 1"  (1.549 m), weight 51.755 kg (114 lb 1.6 oz), SpO2 100.00%.  Physical Exam: General: Alert and awake, oriented , daughter at the bedside  CVS: S1-S2 clear, no murmur rubs or gallops Chest: clear to auscultation bilaterally, no wheezing, rales or rhonchi Abdomen: soft nontender, nondistended, normal bowel sounds  Extremities: no cyanosis, clubbing or edema noted bilaterally Neuro: Cranial nerves II-XII intact, no focal neurological deficits  Lab Results: Basic Metabolic Panel:  Recent Labs Lab 11/20/13 2225 11/21/13 0428  NA 145  --   K 4.6  --   CL 109  --   CO2 23  --   GLUCOSE 88  --   BUN 20  --   CREATININE 0.97 0.86  CALCIUM 10.9*  --    Liver Function Tests:  Recent Labs Lab 11/20/13 2225  AST 30  ALT 21  ALKPHOS 99  BILITOT 0.3  PROT 7.7  ALBUMIN 4.1   No results found for this basename: LIPASE, AMYLASE,  in the last 168 hours No results found for this basename: AMMONIA,  in the last 168  hours CBC:  Recent Labs Lab 11/20/13 2225 11/21/13 0428  WBC 8.5 7.8  HGB 13.6 12.2  HCT 43.4 38.5  MCV 81.7 80.5  PLT 231 200   Cardiac Enzymes:  Recent Labs Lab 11/21/13 0152 11/21/13 0428 11/21/13 0915  CKTOTAL 109  --   --   TROPONINI  --  <0.30 <0.30   BNP: No components found with this basename: POCBNP,  CBG:  Recent Labs Lab 11/21/13 0120 11/22/13 1122  GLUCAP 103* 106*      Micro Results: Recent Results (from the past 240 hour(s))  URINE CULTURE     Status: None   Collection Time    11/20/13 10:38 PM      Result Value Ref Range Status   Specimen Description URINE, CLEAN CATCH   Final   Special Requests ADDED ON 11/21/13 AT 0015   Final   Culture  Setup Time     Final   Value: 11/21/2013 05:02     Performed at [facility]     Final   Value: 60,000 COLONIES/ML     Performed at [facility]   Culture     Final   Value: ESCHERICHIA COLI     Performed at [facility]   Report Status PENDING   Incomplete    Studies/Results: Dg Chest 2 View  11/21/2013   CLINICAL DATA:  Altered mental status.  Left shoulder pain.  EXAM: CHEST  2 VIEW  COMPARISON:  Chest radiograph performed 11/29/2008  FINDINGS: The lungs are hyperexpanded, with flattening of the hemidiaphragms, suggesting COPD. Minimal left basilar atelectasis or scarring is noted. There appears to be a spiculated 1.4 cm nodule at the right lung apex, though this could reflect overlying osseous structures. There is no evidence of pleural effusion or pneumothorax.  The heart is normal in size; the mediastinal contour is within normal limits. No acute osseous abnormalities are seen. There is stable chronic deformity of the proximal left humerus. Clips are noted within the right upper quadrant, reflecting prior cholecystectomy.  IMPRESSION: 1. Apparent spiculated 1.4 cm nodule at the right lung apex, though this could reflect overlying osseous structures. CT of the chest would be helpful for further evaluation, on an elective nonemergent basis. 2. Findings of COPD.   Electronically Signed   By: Garald Balding M.D.   On: 11/21/2013 00:20   Dg Shoulder 1v Left  11/21/2013   CLINICAL DATA:  Altered mental status.  Left shoulder pain.  EXAM: LEFT SHOULDER - 1 VIEW  COMPARISON:  Left shoulder radiographs performed 06/26/2006  FINDINGS: There is stable chronic deformity of the  left humeral head and neck, reflecting a remote impaction fracture. The left humeral head remains seated at the glenoid fossa. No new fractures are seen. The left acromioclavicular joint is grossly unremarkable in appearance. The visualized portions of the left lung appear clear. No significant soft tissue abnormalities are characterized on radiograph.  IMPRESSION: No evidence of acute fracture or dislocation. Stable chronic deformity of the left humeral head and neck.   Electronically Signed   By: Garald Balding M.D.   On: 11/21/2013 00:22   Dg Shoulder 1v Right  11/21/2013   CLINICAL DATA:  Altered mental status. Concern for right shoulder injury.  EXAM: RIGHT SHOULDER - 1 VIEW  COMPARISON:  None.  FINDINGS: There is no evidence of fracture or dislocation. The right humeral head is seated within the glenoid fossa. The acromioclavicular joint is unremarkable in appearance.  No significant soft tissue abnormalities are seen. The visualized portions of the right lung are clear.  IMPRESSION: No evidence of fracture or dislocation.   Electronically Signed   By: Garald Balding M.D.   On: 11/21/2013 00:22   Ct Head Wo Contrast  11/21/2013   CLINICAL DATA:  Status post fall. Scalp hematoma at the back of the head. Altered mental status, with garbled speech. Concern for cervical spine injury.  EXAM: CT HEAD WITHOUT CONTRAST  CT CERVICAL SPINE WITHOUT CONTRAST  TECHNIQUE: Multidetector CT imaging of the head and cervical spine was performed following the standard protocol without intravenous contrast. Multiplanar CT image reconstructions of the cervical spine were also generated.  COMPARISON:  CT of the cervical spine performed 06/26/2006, and MRI of the brain performed 12/01/2008  FINDINGS: CT HEAD FINDINGS  There is no evidence of acute infarction, mass lesion, or intra- or extra-axial hemorrhage on CT.  Prominence of the ventricles and sulci reflects moderate cortical volume loss. This is similar in appearance to  the prior MRI. Decreased white matter attenuation at the posterior lower lobes raises question for mild subependymal resorption of CSF, and mild hydrocephalus cannot be excluded. Diffuse periventricular and subcortical white matter change likely reflects small vessel ischemic microangiopathy. The previously noted vascular lesion at the fourth ventricle is slightly increased in size, measuring 2.7 cm in craniocaudal dimension, with significant calcification. 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  The posterior fossa, including the cerebellum, brainstem and fourth ventricle, is within normal limits. The third and lateral ventricles, and basal ganglia are unremarkable in appearance. The cerebral hemispheres are symmetric in appearance, with normal gray-white differentiation. No mass effect or midline shift is seen.  There is no evidence of fracture; visualized osseous structures are unremarkable in appearance. The orbits are within normal limits. The paranasal sinuses and mastoid air cells are well-aerated. Soft tissue swelling is noted at the occiput.  CT CERVICAL SPINE FINDINGS  There is no evidence of acute fracture or subluxation. Vertebral bodies demonstrate normal height. There is minimal grade 1 retrolisthesis of C3 on C4, and multilevel disc space narrowing is noted at C4-C7, with associated anterior and posterior disc osteophyte complexes. Prevertebral soft tissues are within normal limits.  The thyroid gland is unremarkable in appearance. Minimal emphysematous change is suggested at the lung apices. Dense calcification is noted at the carotid bifurcations bilaterally, with likely associated mild to moderate bilateral luminal narrowing.  IMPRESSION: 1. No evidence of traumatic intracranial injury or fracture. 2. No evidence of acute fracture or subluxation along the cervical spine. 3. Degenerative change noted along the cervical spine. 4. Moderate cortical volume loss and  diffuse small vessel ischemic microangiopathy. 5. Decreased white matter attenuation at the posterior parietal lobes raises question for mild subependymal resorption of CSF; given the patient's chronic vascular lesion at the fourth ventricle, mild hydrocephalus cannot be excluded. However, ventricular size is stable from the MRI performed in 2010. 6. The vascular lesion at the fourth ventricle has mildly increased in size, measuring 2.7 cm in craniocaudal dimension, in comparison to 2.4 cm in 2010. As before, this may reflect a benign choroid plexus tumor or vascular malformation. 7. Soft tissue swelling noted at the occiput. 8. Dense calcification at the carotid bifurcations bilaterally, with likely associated mild to moderate bilateral foraminal narrowing. Carotid ultrasound would be helpful for evaluation, when and as deemed clinically appropriate. 9. Minimal emphysematous change suggested at the lung apices.   Electronically Signed  By: Garald Balding M.D.   On: 11/21/2013 00:36   Ct Angio Chest Pe W/cm &/or Wo Cm  11/21/2013   CLINICAL DATA:  Pulmonary nodule noted on chest radiograph. Altered mental status. Left shoulder pain.  EXAM: CT ANGIOGRAPHY CHEST WITH CONTRAST  TECHNIQUE: Multidetector CT imaging of the chest was performed using the standard protocol during bolus administration of intravenous contrast. Multiplanar CT image reconstructions and MIPs were obtained to evaluate the vascular anatomy.  CONTRAST:  61mL OMNIPAQUE IOHEXOL 350 MG/ML SOLN  COMPARISON:  Chest radiograph performed 11/20/2013  FINDINGS: There is no evidence of pulmonary embolus.  A mildly lobulated 1.2 cm nodule is noted at the posterior aspect of the right upper lobe. This demonstrates slight spiculation and pleural extension. Malignancy cannot be excluded.  Minimal bilateral atelectasis is noted. The lungs are otherwise clear. There is no evidence of significant focal consolidation, pleural effusion or pneumothorax.  Scattered  coronary calcifications are seen. No mediastinal lymphadenopathy is seen. No pericardial effusion is identified. The great vessels are grossly unremarkable in appearance. Scattered calcification is seen along the aortic arch. No axillary lymphadenopathy is seen. The thyroid gland is unremarkable in appearance.  A 1.0 cm hypodensity is noted at the left hepatic lobe, possibly reflecting a small cyst. The visualized portions of the liver and spleen are otherwise unremarkable. The patient is status post cholecystectomy, with clips noted along the gallbladder fossa. The visualized portions of the pancreas and kidneys are within normal limits. There is mild bilateral prominence of the adrenal glands, raising concern for mild adrenal hyperplasia. There is mild aneurysmal dilatation of the infrarenal abdominal aorta, measuring up to 3.2 cm in AP dimension, with associated mural thrombus.  No acute osseous abnormalities are seen. There is mild chronic compression deformity involving vertebral bodies T7 and L1.  Review of the MIP images confirms the above findings.  IMPRESSION: 1. No evidence of pulmonary embolus. 2. 1.2 cm mildly lobulated nodule at the posterior aspect of the right upper lung lobe. This demonstrates slight spiculation and pleural extension. Malignancy cannot be excluded. PET/CT could be considered for further evaluation, or alternatively, tissue diagnosis could be considered. 3. Minimal bilateral atelectasis noted; lungs otherwise clear. 4. Scattered coronary artery calcifications seen. 5. Possible small hepatic cyst. 6. Mild aneurysmal dilatation of the infrarenal abdominal aorta, measuring up to 3.2 cm in AP dimension, with associated mural thrombus. 7. Mild bilateral adrenal hyperplasia. 8. Mild chronic compression deformity involving vertebral bodies T7 and L1.   Electronically Signed   By: Garald Balding M.D.   On: 11/21/2013 04:10   Ct Cervical Spine Wo Contrast  11/21/2013   CLINICAL DATA:   Status post fall. Scalp hematoma at the back of the head. Altered mental status, with garbled speech. Concern for cervical spine injury.  EXAM: CT HEAD WITHOUT CONTRAST  CT CERVICAL SPINE WITHOUT CONTRAST  TECHNIQUE: Multidetector CT imaging of the head and cervical spine was performed following the standard protocol without intravenous contrast. Multiplanar CT image reconstructions of the cervical spine were also generated.  COMPARISON:  CT of the cervical spine performed 06/26/2006, and MRI of the brain performed 12/01/2008  FINDINGS: CT HEAD FINDINGS  There is no evidence of acute infarction, mass lesion, or intra- or extra-axial hemorrhage on CT.  Prominence of the ventricles and sulci reflects moderate cortical volume loss. This is similar in appearance to the prior MRI. Decreased white matter attenuation at the posterior lower lobes raises question for mild subependymal resorption of CSF, and mild  hydrocephalus cannot be excluded. Diffuse periventricular and subcortical white matter change likely reflects small vessel ischemic microangiopathy. The previously noted vascular lesion at the fourth ventricle is slightly increased in size, measuring 2.7 cm in craniocaudal dimension, with significant calcification. 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  The posterior fossa, including the cerebellum, brainstem and fourth ventricle, is within normal limits. The third and lateral ventricles, and basal ganglia are unremarkable in appearance. The cerebral hemispheres are symmetric in appearance, with normal gray-white differentiation. No mass effect or midline shift is seen.  There is no evidence of fracture; visualized osseous structures are unremarkable in appearance. The orbits are within normal limits. The paranasal sinuses and mastoid air cells are well-aerated. Soft tissue swelling is noted at the occiput.  CT CERVICAL SPINE FINDINGS  There is no evidence of acute fracture or  subluxation. Vertebral bodies demonstrate normal height. There is minimal grade 1 retrolisthesis of C3 on C4, and multilevel disc space narrowing is noted at C4-C7, with associated anterior and posterior disc osteophyte complexes. Prevertebral soft tissues are within normal limits.  The thyroid gland is unremarkable in appearance. Minimal emphysematous change is suggested at the lung apices. Dense calcification is noted at the carotid bifurcations bilaterally, with likely associated mild to moderate bilateral luminal narrowing.  IMPRESSION: 1. No evidence of traumatic intracranial injury or fracture. 2. No evidence of acute fracture or subluxation along the cervical spine. 3. Degenerative change noted along the cervical spine. 4. Moderate cortical volume loss and diffuse small vessel ischemic microangiopathy. 5. Decreased white matter attenuation at the posterior parietal lobes raises question for mild subependymal resorption of CSF; given the patient's chronic vascular lesion at the fourth ventricle, mild hydrocephalus cannot be excluded. However, ventricular size is stable from the MRI performed in 2010. 6. The vascular lesion at the fourth ventricle has mildly increased in size, measuring 2.7 cm in craniocaudal dimension, in comparison to 2.4 cm in 2010. As before, this may reflect a benign choroid plexus tumor or vascular malformation. 7. Soft tissue swelling noted at the occiput. 8. Dense calcification at the carotid bifurcations bilaterally, with likely associated mild to moderate bilateral foraminal narrowing. Carotid ultrasound would be helpful for evaluation, when and as deemed clinically appropriate. 9. Minimal emphysematous change suggested at the lung apices.   Electronically Signed   By: Garald Balding M.D.   On: 11/21/2013 00:36   Mri Brain Without Contrast  11/21/2013   CLINICAL DATA:  Fall.  Trauma to back of head.  EXAM: MRI HEAD WITHOUT CONTRAST  TECHNIQUE: Multiplanar, multiecho pulse sequences  of the brain and surrounding structures were obtained without intravenous contrast.  COMPARISON:  CT head from the same day.  MRI brain 12/01/2008.  FINDINGS: A broad-based posterior scalp hematoma is evident. No acute infarct, hemorrhage, or mass lesion is present. The ventricles has increased in size since the prior exam measured both across the frontal horns and at the atrium of the lateral ventricles. There is increased T2 hyperintensity surrounding the ventricles is well third ventricle is slightly larger than on the prior study. The fourth ventricle is slightly more prominent on the sagittal views is well. The aqueduct of Sylvius appears distended. .  Lesion along the inferior vermis is stable in size. 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 Previous postcontrast studies demonstrates significant enhancement in the area. The overall size lesion is stable.  Remote lacunar infarcts of the basal ganglia and  right cerebellum are stable. Two additional lacunar infarcts are present in the right basal ganglia which are not acute.  Flow is present in the major intracranial arteries. The patient is status post bilateral lens replacements. The paranasal sinuses and mastoid air cells are clear.  IMPRESSION: 1. Broad-based posterior scalp hematoma. 2. No evidence for acute intracranial hemorrhage or significant extra-axial fluid. 3. Increasing size of the ventricular system with worsening hydrocephalus. This is likely secondary to partial outlet obstruction at the level of the inferior fourth ventricle. Increased T2 signal surrounding the ventricle likely represents transependymal CSF flow. 4. 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   Electronically Signed   By: Lawrence Santiago M.D.   On: 11/21/2013 09:18    Medications: Scheduled  Meds: . aspirin  325 mg Oral Daily  . cefTRIAXone (ROCEPHIN)  IV  1 g Intravenous Q24H  . enoxaparin (LOVENOX) injection  40 mg Subcutaneous Q24H  . ezetimibe  10 mg Oral Daily  . feeding supplement (ENSURE COMPLETE)  237 mL Oral TID PC  . loratadine  10 mg Oral Daily  . losartan  100 mg Oral Daily  . multivitamin with minerals  1 tablet Oral Daily  . simvastatin  20 mg Oral q1800      LOS: 2 days   Eldoris Beiser Krystal Eaton M.D. [facility] 11/22/2013, 1:32 PM Pager: [phone]  If 7PM-7AM, please contact night-coverage [email or web address] Password [email or web address]  **Disclaimer: This note was dictated with voice recognition software. Similar sounding words can inadvertently be transcribed and this note may contain transcription errors which may not have been corrected upon publication of note.**

## 2013-11-22 NOTE — Progress Notes (Signed)
Please call patient's daughter [name] with any issues.  Her phone number is under the emergency contacts.  Kizzie Bane, Rn

## 2013-11-22 NOTE — Progress Notes (Signed)
Patient removed IV from site and refuses a new one. Most of her medications are PO except for her antibiotic. Will continue to monitor.

## 2013-11-22 NOTE — Progress Notes (Signed)
  Echocardiogram 2D Echocardiogram has been performed.  [place] 11/22/2013, 5:39 PM

## 2013-11-22 NOTE — Progress Notes (Addendum)
Patient ID: [name], female   DOB: [birth year], 79 y.o.   MRN: [id number] Met with patient, sit or in room. Explained situation regarding patient's tumor, that it has changed slightly, and it is now causing hydrocephalus or abnormal collection of fluid on the brain. Despite my efforts to eat as gentle and empathetic with the patient she insists that she has been told that nothing should be done to her tumor, that is inoperable, and it has been there lifelong. I appreciate her stance but noted that things do change in time and at this time she is developing some increased pressure in her brain. I noted to her that I do not advocate any surgery for the tumor, but she may need a cardioversion of the spinal fluid with a device called a shunt. She is clearly not interested in any surgical intervention. She notes that if she is going to die that is fine by her. I advised that I will remain available should she change her mind. Please reconsult if I can be of further assistance.

## 2013-11-22 NOTE — Progress Notes (Addendum)
Patient refused her 1800 medicine.  Kizzie Bane, RN
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
## 2013-11-23 DIAGNOSIS — N390 Urinary tract infection, site not specified: Secondary | ICD-10-CM

## 2013-11-23 DIAGNOSIS — F29 Unspecified psychosis not due to a substance or known physiological condition: Secondary | ICD-10-CM

## 2013-11-23 LAB — URINE CULTURE: Colony Count: 60000

## 2013-11-23 LAB — GLUCOSE, CAPILLARY
GLUCOSE-CAPILLARY: 130 mg/dL — AB (ref 70–99)
Glucose-Capillary: 121 mg/dL — ABNORMAL HIGH (ref 70–99)

## 2013-11-23 NOTE — Progress Notes (Signed)
Pt refused all of her 1000 medications. Sitter at bedside. Will continue to monitor.

## 2013-11-23 NOTE — Progress Notes (Addendum)
Patient ID: [name], female   DOB: August 16, 1934, 79 y.o.   MRN: [id number] [facility] PROGRESS NOTE  [name] QIW:[id number] DOB: Jul 31, 1934 DOA: 11/20/2013 PCP: No PCP Per Patient  Brief narrative: 79 y.o. female with history of CAD status post stenting, hypertension, hyperlipidemia who was brought to [facility] ED 11/20/2013 due to altered mental status. She was brought by EMS and was found by bystander on the street. On admission, CT head did not reveal acute intracranial findings. CXR showed possible spiculated lesion which was also seen on CT angio chest. This nodule is in upper right lung lobe, 1.2 cm in diameter and will likely need further evaluation with PET scan as malignancy cannot be excluded. CT angio chest ruled ut PE. Her cardiac enzymes were unremarkable. She was found to have hydrocephalus on MRI brain and per neurosurgery shunt was recommended by pt decline surgical intervention. She was also seen by psych for possible paranoid schizophrenia and she was determined to be appropriate for [facility] on discharge. In addition, she was started on rocephin for treatment of UTI.  Assessment/Plan:   Principal Problem:  Acute encephalopathy / Syncope / Delirium  Likely multifactorial secondary to UTI, dehydration, hydrocephalus. Her MRI brain showed increasing size of the ventricular system with worsening hydrocephalus which is likely secondary to partial outlet obstruction at the level of the inferior fourth ventricle..  ACS ruled out; CT angio chest was negative for PE  Pt was evaluated by neurosurgery and recommendation was for possible shunt but patient was not interested in having surgical intervention at this time  She is on rocephin for treatment if UTI  EEG done was unremarkable.  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 Pulmonology was consulted, patient was seen by [name] and recommended outpatient management.  HTN (hypertension)  Continue Losartan 100 mg daily    DVT prophylaxis: Lovenox subQ  Code Status: full code  Family Communication: plan of care discussed with the patient at the bedside  Disposition Plan: to [facility] once medically stable    Robbie Lis, MD  [facility] Pager [phone]  If 7PM-7AM, please contact night-coverage [email or web address] Password [email or web address] 11/23/2013, 9:49 AM   LOS: 3 days   Consultants:  Neurosurgery   Psychiatry  Procedures:  EEG  Antibiotics:  Rocephin 11/20/2013 -->  HPI/Subjective: No acute overnight events.  Objective: Filed Vitals:   11/22/13 2119 11/23/13 0541 11/23/13 0753 11/23/13 0945  BP: 175/80 166/90 146/69 162/80  Pulse: 80 84 85 77  Temp: 98.4 F (36.9 C) 98.5 F (36.9 C) 98.1 F (36.7 C) 98 F (36.7 C)  TempSrc: Oral Oral Oral Oral  Resp: 20 20 18 16   Height:      Weight:      SpO2: 96% 96% 99% 99%   No intake or output data in the 24 hours ending 11/23/13 0949  Exam:   General:  Pt is sleeping this am, awakes when woken up and seems to be oriented  Cardiovascular: Regular rate and rhythm, S1/S2, no murmurs  Respiratory: Clear to auscultation bilaterally, no wheezing, no crackles, no rhonchi  Abdomen: Soft, non tender, non distended, bowel sounds present  Extremities:  No edema, pulses DP and PT palpable bilaterally  Neuro: Grossly nonfocal  Data Reviewed: Basic Metabolic Panel:  Recent Labs Lab 11/20/13 2225 11/21/13 0428  NA 145  --   K 4.6  --   CL 109  --   CO2 23  --   GLUCOSE 88  --   BUN 20  --   CREATININE 0.97 0.86  CALCIUM 10.9*  --    Liver Function Tests:  Recent  Labs Lab 11/20/13 2225  AST 30  ALT 21  ALKPHOS 99  BILITOT 0.3  PROT 7.7  ALBUMIN 4.1   No results found for this basename: LIPASE, AMYLASE,  in the last 168 hours No results found for this basename: AMMONIA,  in the last 168 hours CBC:  Recent Labs Lab 11/20/13 2225 11/21/13 0428  WBC 8.5 7.8  HGB 13.6 12.2  HCT 43.4 38.5  MCV 81.7 80.5  PLT 231 200   Cardiac Enzymes:  Recent Labs Lab 11/21/13 0152 11/21/13 0428 11/21/13 0915  CKTOTAL 109  --   --   TROPONINI  --  <0.30 <0.30   BNP: No components found with this basename: POCBNP,  CBG:  Recent Labs Lab 11/21/13 0120 11/22/13 1122 11/23/13 0758  GLUCAP 103* 106* 130*    Recent Results (from the past 240 hour(s))  URINE CULTURE     Status: None   Collection Time    11/20/13 10:38 PM      Result Value Ref Range Status   Specimen Description URINE, CLEAN CATCH   Final   Special Requests ADDED ON 11/21/13 AT 0015   Final   Culture  Setup Time     Final   Value: 11/21/2013 05:02     Performed at [place]     Final   Value: 60,000 COLONIES/ML     Performed at [facility]   Culture     Final   Value: ESCHERICHIA COLI     Performed at [facility]   Report Status PENDING   Incomplete     Studies: No results found.  Scheduled Meds: . aspirin  325 mg Oral Daily  . cefTRIAXone   1 g Intravenous Q24H  . enoxaparin (LOVENOX)   40 mg Subcutaneous Q24H  . ezetimibe  10 mg Oral Daily  . feeding supplement (ENSURE COMPLETE)  237 mL Oral TID PC  . loratadine  10 mg Oral Daily  . losartan  100 mg Oral Daily  . multivitamin   1 tablet Oral Daily  . risperiDONE  0.25 mg Oral BID  . simvastatin  20 mg Oral q1800  . traZODone  50 mg Oral QHS

## 2013-11-24 DIAGNOSIS — G911 Obstructive hydrocephalus: Secondary | ICD-10-CM

## 2013-11-24 LAB — GLUCOSE, CAPILLARY: GLUCOSE-CAPILLARY: 100 mg/dL — AB (ref 70–99)

## 2013-11-24 NOTE — Progress Notes (Signed)
Patient ID: KEANDREA TAPLEY, female   DOB: 14-Sep-1934, 79 y.o.   MRN: [id number] [facility] PROGRESS NOTE  CONSWELLA BRUNEY ZDG:[id number] DOB: 1935-03-13 DOA: 11/20/2013 PCP: No PCP Per Patient  Brief narrative: 79 y.o. female with history of CAD status post stenting, hypertension, hyperlipidemia who was brought to [facility] ED 11/20/2013 due to altered mental status. She was brought by EMS and was found by bystander on the street. On admission, CT head did not reveal acute intracranial findings. CXR showed possible spiculated lesion which was also seen on CT angio chest. This nodule is in upper right lung lobe, 1.2 cm in diameter and will likely need further evaluation with PET scan as malignancy cannot be excluded. CT angio chest ruled ut PE. Her cardiac enzymes were unremarkable. She was found to have hydrocephalus on MRI brain and per neurosurgery shunt was recommended by pt decline surgical intervention. She was also seen by psych for possible paranoid schizophrenia and she was determined to be appropriate for [facility] on discharge. In addition, she was started on rocephin for treatment of UTI.   Assessment/Plan:   Principal Problem:  Acute encephalopathy / Syncope / Delirium  Likely multifactorial secondary to UTI, dehydration, hydrocephalus. Her MRI brain showed increasing size of the ventricular system with worsening hydrocephalus which is likely secondary to partial outlet obstruction at the level of the inferior fourth ventricle.. ACS ruled out; CT angio chest was negative for PE  Patient's mental status is good this am, she is fully oriented. Sitter not at the bedside Pt was evaluated by neurosurgery and recommendation was for possible shunt but patient was not interested in having surgical intervention at this time  She is on rocephin for treatment if UTI  EEG done was unremarkable.  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 Pulmonology was consulted, patient was seen by [name] who recommended outpatient management.  HTN (hypertension)  Continue Losartan 100 mg daily  BP 159/80   DVT prophylaxis: Lovenox subQ    Code Status: full code  Family Communication: plan of care discussed with the patient at the bedside  Disposition Plan: to [facility] once medically stable   Consultants:  Neurosurgery  Psychiatry Procedures:  EEG Antibiotics:  Rocephin 11/20/2013 -->   Robbie Lis, MD  [facility] Pager [phone]  If 7PM-7AM, please contact night-coverage [email or web address] Password [facility] 11/24/2013, 9:23 AM   LOS: 4 days    HPI/Subjective: No acute overnight events.  Objective: Filed Vitals:   11/23/13 2153 11/24/13 0108 11/24/13 0537 11/24/13 0914  BP: 130/73 132/62 160/84 158/80  Pulse: 82 75 74 75  Temp: 98.5 F (36.9 C) 98.5 F (36.9 C) 97.9 F (36.6 C) 98 F (36.7 C)  TempSrc: Oral Oral Oral Oral  Resp: 16 16 16 18   Height:      Weight:      SpO2: 97% 99% 98% 98%   No intake or output data in the 24 hours ending 11/24/13 0923  Exam:   General:  Pt is alert, not in  acute distress  Cardiovascular: Regular rate and rhythm, S1/S2, no murmurs  Respiratory: Clear to auscultation bilaterally, no wheezing, no crackles, no rhonchi  Abdomen: Soft, non tender, non distended, bowel sounds present  Extremities: No edema, pulses DP and PT palpable bilaterally  Neuro: Grossly nonfocal  Data Reviewed: Basic Metabolic Panel:  Recent  Labs Lab 11/20/13 2225 11/21/13 0428  NA 145  --   K 4.6  --   CL 109  --   CO2 23  --   GLUCOSE 88  --   BUN 20  --   CREATININE 0.97 0.86  CALCIUM 10.9*  --    Liver Function Tests:  Recent Labs Lab 11/20/13 2225  AST 30  ALT 21  ALKPHOS 99  BILITOT 0.3  PROT 7.7  ALBUMIN 4.1   No results found for this basename: LIPASE, AMYLASE,  in the last 168 hours No results found for this basename: AMMONIA,  in the last 168 hours CBC:  Recent Labs Lab 11/20/13 2225 11/21/13 0428  WBC 8.5 7.8  HGB 13.6 12.2  HCT 43.4 38.5  MCV 81.7 80.5  PLT 231 200   Cardiac Enzymes:  Recent Labs Lab 11/21/13 0152 11/21/13 0428 11/21/13 0915  CKTOTAL 109  --   --   TROPONINI  --  <0.30 <0.30   BNP: No components found with this basename: POCBNP,  CBG:  Recent Labs Lab 11/21/13 0120 11/22/13 1122 11/23/13 0758 11/23/13 1621  GLUCAP 103* 106* 130* 121*    URINE CULTURE     Status: None   Collection Time    11/20/13 10:38 PM      Result Value Ref Range Status   Specimen Description URINE, CLEAN CATCH   Final   Value: ESCHERICHIA COLI     Performed at [facility]   Report Status 11/23/2013 FINAL   Final   Organism ID, Bacteria ESCHERICHIA COLI   Final     Studies: No results found.  Scheduled Meds: . aspirin  325 mg Oral Daily  . Rocephin  1 g Intravenous Q24H  . enoxaparin (LOVENOX)  40 mg Subcutaneous Q24H  . ezetimibe  10 mg Oral Daily  . feeding supplement   237 mL Oral TID PC  . loratadine  10 mg Oral Daily  . losartan  100 mg Oral Daily  . multivitamin   1 tablet Oral Daily  . risperiDONE  0.25 mg Oral BID  . simvastatin  20 mg Oral q1800  . traZODone  50 mg Oral QHS

## 2013-11-25 LAB — GLUCOSE, CAPILLARY: GLUCOSE-CAPILLARY: 121 mg/dL — AB (ref 70–99)

## 2013-11-25 MED ORDER — NITROFURANTOIN MONOHYD MACRO 100 MG PO CAPS: 100.0000 mg | ORAL_CAPSULE | Freq: Two times a day (BID) | ORAL | Status: DC

## 2013-11-25 MED ORDER — METOPROLOL SUCCINATE ER 50 MG PO TB24: ORAL_TABLET | ORAL | Status: DC

## 2013-11-25 MED ORDER — RISPERIDONE 0.25 MG PO TABS: 0.2500 mg | ORAL_TABLET | Freq: Two times a day (BID) | ORAL | Status: DC

## 2013-11-25 MED ORDER — ADULT MULTIVITAMIN W/MINERALS CH: 1.0000 | ORAL_TABLET | Freq: Every day | ORAL | Status: AC

## 2013-11-25 MED ORDER — TRAZODONE HCL 50 MG PO TABS: 50.0000 mg | ORAL_TABLET | Freq: Every day | ORAL | Status: DC

## 2013-11-25 NOTE — Progress Notes (Signed)
Pt. Roodhouse home via car with family.  DC instructions and prescriptions given to daughter.  Vital signs and assessments were stable.

## 2013-11-25 NOTE — Clinical Social Work Psych Note (Addendum)
12:34pm- Psych CSW received notification from MD regarding daughter wishes to take pt home and care for her after dc.  Psychiatry would need to re-evaluate pt for home dc safety.  MD agreeable.  Psychiatrist contacted this psych CSW and will re-evaluate the pt today for disposition needs.  11:48am- Pt has been evaluated by psychiatry who recommends inpatient geri-psychiatric hospitalization once medically cleared.  Psych CSW faxed pt clinicals to:  Mikel Cella [phone] fax [phone]- bed availability- pt to be reviewed Ellin Mayhew [phone] fax [phone]- bed availability - pt to be reviewed [facility] [phone] fax [phone] - no bed availability Boykin Nearing [phone] fax [phone] - no bed availability- faxed referral for possible addition to waitlist   Nonnie Done, [place] [phone]  Clinical Social Work
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
## 2013-11-25 NOTE — Discharge Instructions (Signed)
Urinary Tract Infection  Urinary tract infections (UTIs) can develop anywhere along your urinary tract. Your urinary tract is your body's drainage system for removing wastes and extra water. Your urinary tract includes two kidneys, two ureters, a bladder, and a urethra. Your kidneys are a pair of bean-shaped organs. Each kidney is about the size of your fist. They are located below your ribs, one on each side of your spine.  CAUSES  Infections are caused by microbes, which are microscopic organisms, including fungi, viruses, and bacteria. These organisms are so small that they can only be seen through a microscope. Bacteria are the microbes that most commonly cause UTIs.  SYMPTOMS   Symptoms of UTIs may vary by age and gender of the patient and by the location of the infection. Symptoms in young women typically include a frequent and intense urge to urinate and a painful, burning feeling in the bladder or urethra during urination. Older women and men are more likely to be tired, shaky, and weak and have muscle aches and abdominal pain. A fever may mean the infection is in your kidneys. Other symptoms of a kidney infection include pain in your back or sides below the ribs, nausea, and vomiting.  DIAGNOSIS  To diagnose a UTI, your caregiver will ask you about your symptoms. Your caregiver also will ask to provide a urine sample. The urine sample will be tested for bacteria and white blood cells. White blood cells are made by your body to help fight infection.  TREATMENT   Typically, UTIs can be treated with medication. Because most UTIs are caused by a bacterial infection, they usually can be treated with the use of antibiotics. The choice of antibiotic and length of treatment depend on your symptoms and the type of bacteria causing your infection.  HOME CARE INSTRUCTIONS   If you were prescribed antibiotics, take them exactly as your caregiver instructs you. Finish the medication even if you feel better after you  have only taken some of the medication.   Drink enough water and fluids to keep your urine clear or pale yellow.   Avoid caffeine, tea, and carbonated beverages. They tend to irritate your bladder.   Empty your bladder often. Avoid holding urine for long periods of time.   Empty your bladder before and after sexual intercourse.   After a bowel movement, women should cleanse from front to back. Use each tissue only once.  SEEK MEDICAL CARE IF:    You have back pain.   You develop a fever.   Your symptoms do not begin to resolve within 3 days.  SEEK IMMEDIATE MEDICAL CARE IF:    You have severe back pain or lower abdominal pain.   You develop chills.   You have nausea or vomiting.   You have continued burning or discomfort with urination.  MAKE SURE YOU:    Understand these instructions.   Will watch your condition.   Will get help right away if you are not doing well or get worse.  Document Released: 03/23/2005 Document Revised: 12/13/2011 Document Reviewed: 07/22/2011  ExitCare Patient Information 2014 [employer]

## 2013-11-25 NOTE — Care Management Note (Unsigned)
    Page 1 of 1   11/25/2013     4:10:07 PM CARE MANAGEMENT NOTE 11/25/2013  Patient:  [name], [name]   Account Number:  [id number]  Date Initiated:  11/25/2013  Documentation initiated by:  Lorne Skeens  Subjective/Objective Assessment:   Patient admitted with altered level of consciousness. Lives at home alone.     Action/Plan:   Will follow for discharge needs pending PT/OT evals and physician orders.   Anticipated DC Date:  11/25/2013   Anticipated DC Plan:  [facility] referral  Clinical Social Worker      DC Planning Services  CM consult      Choice offered to / List presented to:             Status of service:  In process, will continue to follow Medicare Important Message given?   (If response is "NO", the following Medicare IM given date fields will be blank) Date Medicare IM given:  11/21/2013 Date Additional Medicare IM given:  11/25/2013  Discharge Disposition:    Per UR Regulation:    If discussed at Long Length of Stay Meetings, dates discussed:    Comments:  11/25/13 [place], MSN, CM- Patient confused, with 1:1 sitter at bedside.  No family available.  Medicare IM copy left at bedside for daughter when she arrives. Patient's RN aware and will obtain signature from daughter.

## 2013-11-25 NOTE — Discharge Summary (Addendum)
Physician Discharge Summary  [name] HFW:[id number] DOB: [birth year] DOA: 11/20/2013  PCP: No PCP Per Patient  Admit date: 11/20/2013 Discharge date: 11/25/2013  Recommendations for Outpatient Follow-up:  1. Check CBC and BMP during PCP visit 2. i also left info to patient adn her daughter to follow up on CT angio chest results for evaluation of lung nodule with PET scan outpatient.  Discharge Diagnoses:  Principal Problem:   Syncope Active Problems:   HTN (hypertension), benign   CAD (coronary artery disease)   Hypercalcemia   Hyperlipidemia   Lung nodule    Discharge Condition: stable   Diet recommendation: as tolerated   History of present illness:  79 y.o. female with history of CAD status post stenting, hypertension, hyperlipidemia who was brought to [facility] ED 11/20/2013 due to altered mental status. She was brought by EMS and was found by bystander on the street. On admission, CT head did not reveal acute intracranial findings. CXR showed possible spiculated lesion which was also seen on CT angio chest. This nodule is in upper right lung lobe, 1.2 cm in diameter and will likely need further evaluation with PET scan as malignancy cannot be excluded. CT angio chest ruled ut PE. Her cardiac enzymes were unremarkable. She was found to have hydrocephalus on MRI brain and per neurosurgery shunt was recommended by pt decline surgical intervention. She was also seen by psych for possible paranoid schizophrenia and she was determined to be appropriate for [facility] on discharge. In addition, she was started on rocephin for treatment of UTI.   Assessment/Plan:   Principal Problem:  Acute encephalopathy / Syncope / Delirium  Likely multifactorial secondary to UTI, dehydration, hydrocephalus. Her MRI brain showed increasing size of the ventricular system with worsening hydrocephalus which is likely secondary to partial outlet obstruction at the level of the inferior fourth ventricle..  ACS  ruled out; CT angio chest was negative for PE  Patient's mental status remains good but she does report occasional visual hallucinations  Pt was evaluated by neurosurgery and recommendation was for possible shunt but patient was not interested in having surgical intervention at this time  She is on rocephin for treatment if UTI in hospital and will continue Macrobid on discharge for 2 days  EEG done was unremarkable.  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 Pulmonology was consulted, patient was seen by [name] who recommended outpatient management.  HTN (hypertension)  Continue Losartan 100 mg daily and metoprolol 50 mg daily per previous home regimen BP this am 125/77  DVT prophylaxis: Lovenox subQ while in hospital   Code Status: full code  Family Communication: plan of care discussed with the patient at the bedside and daughter over the phone [phone]   Consultants:  Neurosurgery  Psychiatry Procedures:  EEG Antibiotics:  Rocephin 11/20/2013 --> 11/25/2013 Macrobid for 2 days on discharge      Signed:  Robbie Lis, MD  [facility] 11/25/2013, 9:48 AM  Pager #: [phone]  Discharge Exam: Filed Vitals:   11/25/13 0938  BP: 125/77  Pulse: 94  Temp: 97.7 F (36.5 C)  Resp: 18   Filed Vitals:   11/24/13 2132 11/25/13 0135 11/25/13 0543 11/25/13 0938  BP: 153/71 158/70 143/89 125/77  Pulse: 80 85 76 94  Temp: 98.2 F  (36.8 C) 98 F (36.7 C) 98 F (36.7 C) 97.7 F (36.5 C)  TempSrc: Oral Oral Oral Oral  Resp: 18 18 18 18   Height:      Weight:      SpO2: 100% 97% 97% 99%    General: Pt is alert, follows commands appropriately, not in acute distress Cardiovascular: Regular rate and rhythm, S1/S2 +, no murmurs Respiratory: Clear to auscultation bilaterally, no wheezing, no crackles, no rhonchi Abdominal: Soft, non tender, non distended, bowel sounds +, no guarding Extremities: no edema, no cyanosis, pulses palpable bilaterally DP and PT Neuro: Grossly nonfocal  Discharge Instructions  Discharge Instructions   Call MD for:  difficulty breathing, headache or visual disturbances    Complete by:  As directed      Call MD for:  persistant dizziness or light-headedness    Complete by:  As directed      Call MD for:  persistant nausea and vomiting    Complete by:  As directed      Call MD for:  severe uncontrolled pain    Complete by:  As directed      Diet - low sodium heart healthy    Complete by:  As directed      Discharge instructions    Complete by:  As directed   1. Continue Macrobid 100 mg twice a day for E.Coli UTI for 2 more days     Increase activity slowly    Complete by:  As directed             Medication List         aspirin 81 MG tablet  Take 81 mg by mouth daily.     calcium citrate 950 MG tablet  Commonly known as:  CALCITRATE - dosed in mg elemental calcium  Take 1-2 tablets by mouth daily. 400 mg     cholecalciferol 1000 UNITS tablet  Commonly known as:  VITAMIN D  Take 2,000 Units by mouth 2 (two) times daily.     ezetimibe 10 MG tablet  Commonly known as:  ZETIA  Take 1 tablet (10mg  total) by mouth daily. <MUST KEEP APPOINTMENT>     loratadine 10 MG tablet  Commonly known as:  CLARITIN  Take 10 mg by mouth daily.     losartan 100 MG tablet  Commonly known as:  COZAAR  Take 1 tablet (100 mg total) by mouth daily. Take 1 tablet (100mg  total) by mouth  daily. <MUST KEEP APPOINTMENT>     metoprolol succinate 50 MG 24 hr tablet  Commonly known as:  TOPROL-XL  Take 1 tablet daily     multivitamin with minerals Tabs tablet  Take 1 tablet by mouth daily.     nitrofurantoin (macrocrystal-monohydrate) 100 MG capsule  Commonly known as:  MACROBID  Take 1 capsule (100 mg total) by mouth 2 (two) times daily.     pravastatin 40 MG tablet  Commonly known as:  PRAVACHOL  Take 1 tablet (40 mg total) by mouth daily.     risperiDONE 0.25 MG tablet  Commonly known as:  RISPERDAL  Take 1 tablet (0.25 mg total) by mouth 2 (two) times daily.  SYSTANE OP  Place 1 drop into both eyes 2 (two) times daily as needed (dry eyes).     traZODone 50 MG tablet  Commonly known as:  DESYREL  Take 1 tablet (50 mg total) by mouth at bedtime.             Follow-up Information   Follow up with No PCP Per Patient.   Specialty:  General Practice       The results of significant diagnostics from this hospitalization (including imaging, microbiology, ancillary and laboratory) are listed below for reference.    Significant Diagnostic Studies: Dg Chest 2 View 11/21/2013   IMPRESSION: 1. Apparent spiculated 1.4 cm nodule at the right lung apex, though this could reflect overlying osseous structures. CT of the chest would be helpful for further evaluation, on an elective nonemergent basis. 2. Findings of COPD.     Dg Shoulder 1v Left 11/21/2013     IMPRESSION: No evidence of acute fracture or dislocation. Stable chronic deformity of the left humeral head and neck.     Dg Shoulder 1v Right 11/21/2013   IMPRESSION: No evidence of fracture or dislocation.     Ct Head Wo Contrast 11/21/2013    IMPRESSION: 1. No evidence of traumatic intracranial injury or fracture. 2. No evidence of acute fracture or subluxation along the cervical spine. 3. Degenerative change noted along the cervical spine. 4. Moderate cortical volume loss and diffuse small vessel ischemic  microangiopathy. 5. Decreased white matter attenuation at the posterior parietal lobes raises question for mild subependymal resorption of CSF; given the patient's chronic vascular lesion at the fourth ventricle, mild hydrocephalus cannot be excluded. However, ventricular size is stable from the MRI performed in 2010. 6. The vascular lesion at the fourth ventricle has mildly increased in size, measuring 2.7 cm in craniocaudal dimension, in comparison to 2.4 cm in 2010. As before, this may reflect a benign choroid plexus tumor or vascular malformation. 7. Soft tissue swelling noted at the occiput. 8. Dense calcification at the carotid bifurcations bilaterally, with likely associated mild to moderate bilateral foraminal narrowing. Carotid ultrasound would be helpful for evaluation, when and as deemed clinically appropriate. 9. Minimal emphysematous change suggested at the lung apices.     Ct Angio Chest Pe W/cm &/or Wo Cm 11/21/2013     IMPRESSION: 1. No evidence of pulmonary embolus. 2. 1.2 cm mildly lobulated nodule at the posterior aspect of the right upper lung lobe. This demonstrates slight spiculation and pleural extension. Malignancy cannot be excluded. PET/CT could be considered for further evaluation, or alternatively, tissue diagnosis could be considered. 3. Minimal bilateral atelectasis noted; lungs otherwise clear. 4. Scattered coronary artery calcifications seen. 5. Possible small hepatic cyst. 6. Mild aneurysmal dilatation of the infrarenal abdominal aorta, measuring up to 3.2 cm in AP dimension, with associated mural thrombus. 7. Mild bilateral adrenal hyperplasia. 8. Mild chronic compression deformity involving vertebral bodies T7 and L1.    Ct Cervical Spine Wo Contrast 11/21/2013  IMPRESSION: 1. No evidence of traumatic intracranial injury or fracture. 2. No evidence of acute fracture or subluxation along the cervical spine. 3. Degenerative change noted along the cervical spine. 4. Moderate  cortical volume loss and diffuse small vessel ischemic microangiopathy. 5. Decreased white matter attenuation at the posterior parietal lobes raises question for mild subependymal resorption of CSF; given the patient's chronic vascular lesion at the fourth ventricle, mild hydrocephalus cannot be excluded. However, ventricular size is stable from the MRI performed in 2010. 6. The vascular  lesion at the fourth ventricle has mildly increased in size, measuring 2.7 cm in craniocaudal dimension, in comparison to 2.4 cm in 2010. As before, this may reflect a benign choroid plexus tumor or vascular malformation. 7. Soft tissue swelling noted at the occiput. 8. Dense calcification at the carotid bifurcations bilaterally, with likely associated mild to moderate bilateral foraminal narrowing. Carotid ultrasound would be helpful for evaluation, when and as deemed clinically appropriate. 9. Minimal emphysematous change suggested at the lung apices.   Electronically Signed   By: Garald Balding M.D.   On: 11/21/2013 00:36   Mri Brain Without Contrast 11/21/2013    IMPRESSION: 1. Broad-based posterior scalp hematoma. 2. No evidence for acute intracranial hemorrhage or significant extra-axial fluid. 3. Increasing size of the ventricular system with worsening hydrocephalus. This is likely secondary to partial outlet obstruction at the level of the inferior fourth ventricle. Increased T2 signal surrounding the ventricle likely represents transependymal CSF flow. 4. 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   Electronically Signed   By: Lawrence Santiago M.D.   On: 11/21/2013 09:18    Microbiology: URINE CULTURE     Status: None   Collection Time    11/20/13 10:38 PM      Result Value Ref Range Status   Specimen Description URINE, CLEAN CATCH   Final   Value: ESCHERICHIA COLI     Performed at  [facility]   Report Status 11/23/2013 FINAL   Final   Organism ID, Bacteria ESCHERICHIA COLI   Final     Labs: Basic Metabolic Panel:  Recent Labs Lab 11/20/13 2225 11/21/13 0428  NA 145  --   K 4.6  --   CL 109  --   CO2 23  --   GLUCOSE 88  --   BUN 20  --   CREATININE 0.97 0.86  CALCIUM 10.9*  --    Liver Function Tests:  Recent Labs Lab 11/20/13 2225  AST 30  ALT 21  ALKPHOS 99  BILITOT 0.3  PROT 7.7  ALBUMIN 4.1   No results found for this basename: LIPASE, AMYLASE,  in the last 168 hours No results found for this basename: AMMONIA,  in the last 168 hours CBC:  Recent Labs Lab 11/20/13 2225 11/21/13 0428  WBC 8.5 7.8  HGB 13.6 12.2  HCT 43.4 38.5  MCV 81.7 80.5  PLT 231 200   Cardiac Enzymes:  Recent Labs Lab 11/21/13 0152 11/21/13 0428 11/21/13 0915  CKTOTAL 109  --   --   TROPONINI  --  <0.30 <0.30   BNP: BNP (last 3 results) No results found for this basename: PROBNP,  in the last 8760 hours CBG:  Recent Labs Lab 11/21/13 0120 11/22/13 1122 11/23/13 0758 11/23/13 1621 11/24/13 1227  GLUCAP 103* 106* 130* 121* 100*    Time coordinating discharge: Over 30 minutes

## 2013-11-25 NOTE — Evaluation (Signed)
Physical Therapy Evaluation Patient Details Name: [name] MRN: [id number] DOB: [birth year] Today's Date: 11/25/2013   History of Present Illness  [name] is a 79 y.o. female admitted 5/27 with history of CAD status post stenting, hypertension, hyperlipidemia was found on the road by a bystander and was brought to the ER by the EMS. Patient was found on the road 1 mile away from her house. Patient states that she remembers going for a walk but does not recall what happened after that. The next thing she can remember is that she was brought to the hospital by EMS. Patient had mild hematoma on scalp. CT head and neck did not show anything acute. Chest x-ray shows possible spiculated lesion. EKG and cardiac markers were unremarkable. When patient was brought to the ER initially patient was confused and also was hallucinating. As per the ER physician patient was talking about people watching her. On my exam patient presently is alert awake oriented to time place and person and moves all extremities. Has mild headache. Denies any chest pain shortness of breath nausea vomiting difficulty speaking swallowing or any visual symptoms. Denies any incontinence of urine or tongue bite. 3 years ago patient did have an episode of psychosis. Today patient urine shows possible UTI.  Clinical Impression  Pt with questionable report of PLOF and living situation. Pt mobility limited by confusion and altered mentation. Pt functioning at a min guard level but unsafe to d/c home alone due to impaired cognition including memory, safety awareness, and orientation. Pt to require 24/7 assist/supervision for safe d/c home.     Follow Up Recommendations No PT follow up;Supervision/Assistance - 24 hour    Equipment Recommendations  None recommended by PT    Recommendations for Other Services       Precautions / Restrictions Precautions Precautions: Fall Restrictions Weight Bearing Restrictions: No       Mobility  Bed Mobility Overal bed mobility: Needs Assistance Bed Mobility: Supine to Sit     Supine to sit: Min assist     General bed mobility comments: tactile cues to initiate transfer and complete task, pt easily distracted  Transfers Overall transfer level: Needs assistance Equipment used: 1 person hand held assist Transfers: Sit to/from Stand Sit to Stand: Min guard         General transfer comment: min guard to direct pt into transfer due to high distractability  Ambulation/Gait Ambulation/Gait assistance: Min guard Ambulation Distance (Feet): 150 Feet Assistive device: 1 person hand held assist Gait Pattern/deviations: Step-through pattern Gait velocity: wfl   General Gait Details: pt with no episodes of LOB however difficulty navigating due to impaired cognition  Stairs            Wheelchair Mobility    Modified Rankin (Stroke Patients Only)       Balance Overall balance assessment: Needs assistance Sitting-balance support: Feet supported;No upper extremity supported Sitting balance-Leahy Scale: Poor Sitting balance - Comments: pt required minA to maintain EOB sitting while taking pills, pt aware she is leaning backwards but unable to self correct Postural control: Posterior lean Standing balance support: No upper extremity supported Standing balance-Leahy Scale: Fair                               Pertinent Vitals/Pain Report mild bilat LE discomfort but did not rate    Home Living Family/patient expects to be discharged to:: Private residence  Additional Comments: pt poor historian. Pt reports she lives by herself in 2 story home but RN reports that pt lives with the dtr    Prior Function Level of Independence: Independent               Hand Dominance   Dominant Hand: Right    Extremity/Trunk Assessment   Upper Extremity Assessment: Overall WFL for tasks assessed           Lower  Extremity Assessment: Overall WFL for tasks assessed         Communication   Communication: No difficulties  Cognition Arousal/Alertness: Awake/alert Behavior During Therapy: WFL for tasks assessed/performed Overall Cognitive Status: Impaired/Different from baseline Area of Impairment: Orientation;Attention;Safety/judgement Orientation Level: Disoriented to;Place Current Attention Level: Sustained Memory: Decreased short-term memory   Safety/Judgement: Decreased awareness of safety;Decreased awareness of deficits     General Comments: pt strongly believe she meet the PT before as PT was the first person to annoy her. " however this was the first encounter with PT. pt easily distracted. pt able to report date but reports she is in [place], once re-oriented to [employer], [place] pt stated she was at [facility] and that she walked over a mile and a cop found her    General Comments      Exercises        Assessment/Plan    PT Assessment Patient needs continued PT services  PT Diagnosis  (altered mental status)   PT Problem List Decreased strength;Decreased activity tolerance;Decreased balance;Decreased mobility;Decreased cognition  PT Treatment Interventions Gait training;Stair training;Functional mobility training;Therapeutic activities;Therapeutic exercise;Balance training   PT Goals (Current goals can be found in the Care Plan section) Acute Rehab PT Goals Patient Stated Goal: didn't state PT Goal Formulation: With patient Time For Goal Achievement: 12/02/13 Potential to Achieve Goals: Good    Frequency Min 2X/week   Barriers to discharge        Co-evaluation               End of Session Equipment Utilized During Treatment: Gait belt Activity Tolerance: Patient tolerated treatment well Patient left: in bed;with call bell/phone within reach;with chair alarm set Nurse Communication: Mobility status         Time: 4403-4742 PT Time Calculation (min): 15  min   Charges:   PT Evaluation $Initial PT Evaluation Tier I: 1 Procedure PT Treatments $Gait Training: 8-22 mins   PT G CodesKoleen Distance Amanada Philbrick 11/25/2013, 10:29 AM  Kittie Plater, PT, DPT Pager #: [phone] Office #: [phone]

## 2013-11-26 ENCOUNTER — Telehealth: Payer: Self-pay

## 2013-11-27 ENCOUNTER — Encounter: Payer: Self-pay | Admitting: Family Medicine

## 2013-11-27 ENCOUNTER — Ambulatory Visit (INDEPENDENT_AMBULATORY_CARE_PROVIDER_SITE_OTHER): Payer: Medicare Other | Admitting: Family Medicine

## 2013-11-27 VITALS — BP 158/102 | HR 58 | Temp 98.1°F | Resp 16 | Ht 60.5 in | Wt 117.0 lb

## 2013-11-27 DIAGNOSIS — R911 Solitary pulmonary nodule: Secondary | ICD-10-CM

## 2013-11-27 DIAGNOSIS — G911 Obstructive hydrocephalus: Secondary | ICD-10-CM

## 2013-11-27 DIAGNOSIS — R410 Disorientation, unspecified: Secondary | ICD-10-CM

## 2013-11-27 DIAGNOSIS — R296 Repeated falls: Secondary | ICD-10-CM

## 2013-11-27 DIAGNOSIS — D496 Neoplasm of unspecified behavior of brain: Secondary | ICD-10-CM

## 2013-11-27 DIAGNOSIS — R404 Transient alteration of awareness: Secondary | ICD-10-CM

## 2013-11-27 DIAGNOSIS — R55 Syncope and collapse: Secondary | ICD-10-CM

## 2013-11-27 NOTE — Progress Notes (Signed)
Subjective:    Patient ID: [name], female    DOB: [birth year], 79 y.o.   MRN: [id number]  11/27/2013  Establish Care   HPI This 79 y.o. female presents for TRANSITION INTO CARE: Recently admitted to [facility] 4/27-11/25/13.    The details of patient's discharge summary are as follows:    Admit date: 11/20/2013  Discharge date: 11/25/2013  Recommendations for Outpatient Follow-up:  1. Check CBC and BMP during PCP visit 2. i also left info to patient adn her daughter to follow up on CT angio chest results for evaluation of lung nodule with PET scan outpatient. Discharge Diagnoses:  Principal Problem:  Syncope  Active Problems:  HTN (hypertension), benign  CAD (coronary artery disease)  Hypercalcemia  Hyperlipidemia  Lung nodule  Discharge Condition: stable  Diet recommendation: as tolerated  History of present illness:  79 y.o. female with history of CAD status post stenting, hypertension, hyperlipidemia who was brought to [facility] ED 11/20/2013 due to altered mental status. She was brought by EMS and was found by bystander on the street. On admission, CT head did not reveal acute intracranial findings. CXR showed possible spiculated lesion which was also seen on CT angio chest. This nodule is in upper right lung lobe, 1.2 cm in diameter and will likely need further evaluation with PET scan as malignancy cannot be excluded. CT angio chest ruled ut PE. Her cardiac enzymes were unremarkable. She was found to have hydrocephalus on MRI brain and per neurosurgery shunt was recommended by pt decline surgical intervention. She was also seen by psych for possible paranoid schizophrenia and she was determined to be appropriate for [facility] on discharge. In addition, she was started on rocephin for treatment of UTI.      Principal Problem:  Acute encephalopathy / Syncope / Delirium  Likely multifactorial secondary to UTI, dehydration, hydrocephalus. Her MRI brain showed increasing size  of the ventricular system with worsening hydrocephalus which is likely secondary to partial outlet obstruction at the level of the inferior fourth ventricle..  ACS ruled out; CT angio chest was negative for PE  Patient's mental status remains good but she does report occasional visual hallucinations  Pt was evaluated by neurosurgery and recommendation was for possible shunt but patient was not interested in having surgical intervention at this time  She is on rocephin for treatment if UTI in hospital and will continue Macrobid on discharge for 2 days  EEG done was unremarkable.  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 Pulmonology was consulted, patient was seen by [name] who recommended outpatient management.  HTN (hypertension)  Continue Losartan 100 mg daily and metoprolol 50 mg daily per previous home regimen  BP this am 125/77 DVT prophylaxis: Lovenox subQ while in hospital  Code Status: full code  Family Communication: plan of care discussed with the patient at the bedside and daughter over the phone [phone]  Consultants:  Neurosurgery  Psychiatry Procedures:  EEG Antibiotics:  Rocephin 11/20/2013 -->  11/25/2013  Macrobid for 2 days on discharge   Daughter is present at visit and wants to discuss hospitalization; daughter was not present throughout hospitalization and is concerned regarding decisions made by patient in an acute delirious state.    Brain posterior fossa tumor/fourth ventricle tumor with hydrocephalus:  Advised 20 years ago that had brain tumor and advised against resection.  Asymptomatic at that time.  Has suffered with several falls recently.  Keeps falling to the same side.  Suffered with recurrent fall one week ago and presented to ED with altered mental status.  S/p MRI brian during recent admission; NS consulted; NS recommended shunting.  Daughter sees frequent falls, unsteady gait, memory issues.  Has known osteoporosis.  Patient refused shunting during admission.  Daughter would like to discuss further.  With fall last week, hit head with subcutaneous swelling.  EMS found patient near the road.  Taking a walk at night; daughter was at the house the weekend before due to unsteady gait.  Vision changes over the weekend.  No surgical resection recommended; a shunt is recommended.    Altered mental status/confusion:  Drastic change in mental status last week; daughter reports memry loss and confusion in the past; loses some things; suffering with falls.  Missed Risperdal last night and this morning; missed Trazodone last night.  Lives alone; daughter lives across town.  No Lifeline.  Extensive discussions about living alone; pt has declined living somewhere else. Has lived alone independently without issue.  Family strife occurring currently.Pt thinks that nephew is living in the attic that is not completed.  No flooring.  Auditory hallucinations recently.  Patient's concern about family in the attic for years.    Lung nodule: detected on CT scan during admission; PET scan recommended as outpatient.  S/p pulmonology consultation by Dr. Shawna Orleans who recommended outpatient work up.  Longstanding tobacco history.  Still smoking now.    UTI: s/p treatment with Rocephin and Macrobid.  Has two remaining doses of Macrobid. [id number] daugther/Ryder Man  Review of Systems  Constitutional: Negative for fever, chills, diaphoresis and fatigue.   Eyes: Positive for visual disturbance. Negative for photophobia.  Respiratory: Negative for cough, shortness of breath, wheezing and stridor.   Cardiovascular: Negative for chest pain, palpitations and leg swelling.  Gastrointestinal: Negative for nausea, abdominal pain, diarrhea, constipation and rectal pain.  Endocrine: Negative for polyuria.  Genitourinary: Negative for dysuria, urgency and hematuria.  Musculoskeletal: Positive for arthralgias and myalgias. Negative for gait problem, neck pain and neck stiffness.  Skin: Negative for color change and wound.  Neurological: Negative for dizziness, tremors, seizures, syncope, speech difficulty, weakness, light-headedness, numbness and headaches.  Psychiatric/Behavioral: Positive for confusion. Negative for behavioral problems and agitation. The patient is not nervous/anxious.     Past Medical History  Diagnosis Date  . Hyperlipidemia   . Essential hypertension, benign   . CAD (coronary artery disease)   . Osteoporosis   . Hypercalcemia   . Vitamin D deficiency   . Ovarian tumor   . Breast tumor   . MI (myocardial infarction) July 1995    Treated with LAD PCI- Dr Adora Fridge  . Cataract    Past Surgical History  Procedure Laterality Date  . Angioplasty    . Hip pinning    . Breast lumpectomy    . Abdominal hysterectomy    . Cholecystectomy    . Fracture surgery  Nov 2003    L distal radius fx- Dr. Frederik Pear  . Eye surgery  Cataract removal    Allergies  Allergen Reactions  . Sulfa Antibiotics Swelling  . Codeine Nausea And Vomiting  . Other Nausea And Vomiting    Some type of pill given for allergic reaction    Current Outpatient Prescriptions  Medication Sig Dispense Refill  . aspirin 81 MG tablet Take 81 mg by mouth daily.      . calcium citrate (CALCITRATE - DOSED IN MG ELEMENTAL CALCIUM) 950 MG tablet Take 1-2 tablets by mouth daily. 400 mg      . cholecalciferol (VITAMIN D) 1000 UNITS tablet Take 2,000 Units  by mouth 2 (two) times daily.       Marland Kitchen ezetimibe (ZETIA) 10 MG tablet Take 1 tablet (10mg  total) by mouth daily. <MUST KEEP APPOINTMENT>  30 tablet  10  . loratadine (CLARITIN) 10 MG tablet Take 10 mg by mouth daily.      . metoprolol succinate (TOPROL-XL) 50 MG 24 hr tablet Take 1 tablet daily  30 tablet  0  . Multiple Vitamin (MULTIVITAMIN WITH MINERALS) TABS tablet Take 1 tablet by mouth daily.  30 tablet  0  . nitrofurantoin, macrocrystal-monohydrate, (MACROBID) 100 MG capsule Take 1 capsule (100 mg total) by mouth 2 (two) times daily.  4 capsule  0  . Polyethyl Glycol-Propyl Glycol (SYSTANE OP) Place 1 drop into both eyes 2 (two) times daily as needed (dry eyes).      . pravastatin (PRAVACHOL) 40 MG tablet Take 1 tablet (40 mg total) by mouth daily.  30 tablet  11  . risperiDONE (RISPERDAL) 0.25 MG tablet Take 1 tablet (0.25 mg total) by mouth 2 (two) times daily.  60 tablet  0  . traZODone (DESYREL) 50 MG tablet Take 1 tablet (50 mg total) by mouth at bedtime.  30 tablet  0  . losartan (COZAAR) 100 MG tablet Take 1 tablet (100 mg total) by mouth daily. Take 1 tablet (100mg  total) by mouth daily. <MUST KEEP APPOINTMENT>  30 tablet  11   No current facility-administered medications for this visit.   History   Social History  . Marital Status: Widowed    Spouse Name: N/A    Number of Children: 1  . Years of Education: N/A   Occupational History  . retired    Social History Main Topics  . Smoking status: Current Some Day Smoker    Types: Cigarettes  . Smokeless tobacco: Not on file  . Alcohol Use: No  . Drug Use: No  . Sexual Activity: No   Other Topics Concern  . Not on file   Social History Narrative   Marital status: widowed.      Children: one daughter [name] [phone])      Employment: retired; Master's degree.      Lives: alone.      Tobacco: daily smoking.       Objective:    BP 158/102  Pulse 58  Temp(Src) 98.1 F (36.7 C) (Oral)  Resp 16  Ht 5' 0.5"  (1.537 m)  Wt 117 lb (53.071 kg)  BMI 22.47 kg/m2  SpO2 96% Physical Exam  Nursing note and vitals reviewed. Constitutional: She is oriented to person, place, and time. She appears well-developed and well-nourished. No distress.  HENT:  Head: Normocephalic and atraumatic.  Right Ear: External ear normal.  Left Ear: External ear normal.  Nose: Nose normal.  Mouth/Throat: Oropharynx is clear and moist.  Eyes: Conjunctivae and EOM are normal. Pupils are equal, round, and reactive to light.  Neck:  Normal range of motion. Neck supple. No JVD present. No thyromegaly present.  Cardiovascular: Normal rate, regular rhythm and normal heart sounds.  Exam reveals no gallop and no friction rub.   No murmur heard. Pulmonary/Chest: Effort normal and breath sounds normal. She has no wheezes. She has no rales.  Abdominal: Soft. Bowel sounds are normal. She exhibits no distension and no mass. There is no tenderness. There is no rebound and no guarding.  Lymphadenopathy:    She has no cervical adenopathy.  Neurological: She is alert and oriented to person, place, and time. No cranial nerve deficit or sensory deficit. She exhibits normal muscle tone. She displays a negative Romberg sign. Coordination normal.  Skin: She is not diaphoretic.  Psychiatric: She has a normal mood and affect. Her behavior is normal. Her speech is tangential. Cognition and memory are normal. She expresses inappropriate judgment. She does not express impulsivity.   Results for orders placed during the hospital encounter of 11/20/13  URINE CULTURE      Result Value Ref Range   Specimen Description URINE, CLEAN CATCH     Special Requests ADDED ON 11/21/13 AT 0015     Culture  Setup Time       Value: 11/21/2013 05:02     Performed at [facility] Count       Value: 60,000 COLONIES/ML     Performed at [facility]   Culture       Value: ESCHERICHIA COLI     Performed at [facility]   Report  Status 11/23/2013 FINAL     Organism ID, Bacteria ESCHERICHIA COLI    CBC      Result Value Ref Range   WBC 8.5  4.0 - 10.5 K/uL   RBC 5.31 (*) 3.87 - 5.11 MIL/uL   Hemoglobin 13.6  12.0 - 15.0 g/dL   HCT 43.4  36.0 - 46.0 %   MCV 81.7  78.0 - 100.0 fL   MCH 25.6 (*) 26.0 - 34.0 pg   MCHC 31.3  30.0 - 36.0 g/dL   RDW 16.5 (*) 11.5 - 15.5 %   Platelets 231  150 - 400 K/uL  COMPREHENSIVE METABOLIC PANEL      Result Value Ref Range   Sodium 145  137 - 147 mEq/L   Potassium 4.6  3.7 - 5.3 mEq/L   Chloride 109  96 - 112 mEq/L   CO2 23  19 - 32 mEq/L   Glucose, Bld 88  70 - 99 mg/dL   BUN 20  6 - 23 mg/dL   Creatinine, Ser 0.97  0.50 - 1.10 mg/dL   Calcium 10.9 (*) 8.4 - 10.5 mg/dL   Total Protein 7.7  6.0 - 8.3 g/dL   Albumin 4.1  3.5 - 5.2 g/dL   AST 30  0 - 37 U/L   ALT 21  0 - 35 U/L   Alkaline Phosphatase 99  39 - 117 U/L   Total Bilirubin 0.3  0.3 - 1.2 mg/dL   GFR calc non Af Amer 55 (*) >90 mL/min   GFR calc Af Amer 64 (*) >90 mL/min  URINALYSIS, ROUTINE W REFLEX MICROSCOPIC      Result Value Ref Range   Color, Urine YELLOW  YELLOW   APPearance CLEAR  CLEAR   Specific Gravity, Urine 1.013  1.005 - 1.030   pH 5.5  5.0 - 8.0   Glucose, UA NEGATIVE  NEGATIVE mg/dL   Hgb urine dipstick  TRACE (*) NEGATIVE   Bilirubin Urine NEGATIVE  NEGATIVE   Ketones, ur NEGATIVE  NEGATIVE mg/dL   Protein, ur 30 (*) NEGATIVE mg/dL   Urobilinogen, UA 0.2  0.0 - 1.0 mg/dL   Nitrite NEGATIVE  NEGATIVE   Leukocytes, UA MODERATE (*) NEGATIVE  TSH      Result Value Ref Range   TSH 0.033 (*) 0.350 - 4.500 uIU/mL  URINE RAPID DRUG SCREEN (HOSP PERFORMED)      Result Value Ref Range   Opiates NONE DETECTED  NONE DETECTED   Cocaine NONE DETECTED  NONE DETECTED   Benzodiazepines NONE DETECTED  NONE DETECTED   Amphetamines NONE DETECTED  NONE DETECTED   Tetrahydrocannabinol NONE DETECTED  NONE DETECTED   Barbiturates NONE DETECTED  NONE DETECTED  ETHANOL      Result Value Ref Range    Alcohol, Ethyl (B) <11  0 - 11 mg/dL  ACETAMINOPHEN LEVEL      Result Value Ref Range   Acetaminophen (Tylenol), Serum <15.0  10 - 30 ug/mL  SALICYLATE LEVEL      Result Value Ref Range   Salicylate Lvl <1.9 (*) 2.8 - 20.0 mg/dL  URINE MICROSCOPIC-ADD ON      Result Value Ref Range   Squamous Epithelial / LPF RARE  RARE   WBC, UA 21-50  <3 WBC/hpf   RBC / HPF 0-2  <3 RBC/hpf   Bacteria, UA FEW (*) RARE  CK      Result Value Ref Range   Total CK 109  7 - 177 U/L  TROPONIN I      Result Value Ref Range   Troponin I <0.30  <0.30 ng/mL  TROPONIN I      Result Value Ref Range   Troponin I <0.30  <0.30 ng/mL  VITAMIN D 25 HYDROXY      Result Value Ref Range   Vit D, 25-Hydroxy 89  30 - 89 ng/mL  PARATHYROID HORMONE, INTACT (NO CA)      Result Value Ref Range   PTH 68.9  14.0 - 72.0 pg/mL  CBC      Result Value Ref Range   WBC 7.8  4.0 - 10.5 K/uL   RBC 4.78  3.87 - 5.11 MIL/uL   Hemoglobin 12.2  12.0 - 15.0 g/dL   HCT 38.5  36.0 - 46.0 %   MCV 80.5  78.0 - 100.0 fL   MCH 25.5 (*) 26.0 - 34.0 pg   MCHC 31.7  30.0 - 36.0 g/dL   RDW 16.5 (*) 11.5 - 15.5 %   Platelets 200  150 - 400 K/uL  CREATININE, SERUM      Result Value Ref Range   Creatinine, Ser 0.86  0.50 - 1.10 mg/dL   GFR calc non Af Amer 64 (*) >90 mL/min   GFR calc Af Amer 74 (*) >90 mL/min  T4, FREE      Result Value Ref Range   Free T4 1.93 (*) 0.80 - 1.80 ng/dL  T3, FREE      Result Value Ref Range   T3, Free 3.4  2.3 - 4.2 pg/mL  GLUCOSE, CAPILLARY      Result Value Ref Range   Glucose-Capillary 106 (*) 70 - 99 mg/dL  GLUCOSE, CAPILLARY      Result Value Ref Range   Glucose-Capillary 130 (*) 70 - 99 mg/dL   Comment 1 Documented in Chart     Comment 2 Notify RN    GLUCOSE, CAPILLARY  Result Value Ref Range   Glucose-Capillary 121 (*) 70 - 99 mg/dL  GLUCOSE, CAPILLARY      Result Value Ref Range   Glucose-Capillary 100 (*) 70 - 99 mg/dL   Comment 1 Notify RN    GLUCOSE, CAPILLARY      Result  Value Ref Range   Glucose-Capillary 121 (*) 70 - 99 mg/dL   Comment 1 Notify RN     Comment 2 Documented in Chart    CBG MONITORING, ED      Result Value Ref Range   Glucose-Capillary 103 (*) 70 - 99 mg/dL  I-STAT TROPOININ, ED      Result Value Ref Range   Troponin i, poc 0.00  0.00 - 0.08 ng/mL   Comment 3                Assessment & Plan:  Obstructive hydrocephalus - Plan: Ambulatory referral to Neurosurgery, CBC with Differential, Comprehensive metabolic panel  Posterior fossa tumor - Plan: Ambulatory referral to Neurosurgery, CBC with Differential, Comprehensive metabolic panel  Delirium  Syncope  Lung nodule  Falls frequently  1.  Obstructive hydrocephalus:  New to this provider; patient refused shunt placement during admission but now agreeable to consult NS regarding procedure. 2.  Posterior fossa tumor: present; patient refuses surgery on tumor but willing to discuss shunt.  Refer to NS. 3.  Delirium/Altered Mental status:  New last week warranting admission; with recent confusion, paranoid behavior and hallucinations.  Continue Risperdal and Trazodone. Living alone currently; refer for [facility] evaluation of home and to assess safety and placement options if needed.   4.  Syncope: reported prior to admission; s/p cardiac enzymes, CT angio, CT head, MRI.   5.  Lung nodule: New.  Will warrant PET scan to assess better; current smoker; family agrees to address hydrocephaly, confusion, frequent falls first.  Once addresses, refer for PET scan. 6.  Falls frequently/unsteady gait: onset in the past two years with visual hallucinations, behavior issues.  May all be secondary to hydrocephaly.  Close follow-up; refer for [facility] to perform full home evaluation.  No orders of the defined types were placed in this encounter.    Return in about 2 weeks (around 12/11/2013) for recheck.    Reginia Forts, M.D.  [facility] [address] [place], [place]   [address] [phone] phone [phone] fax

## 2013-11-28 LAB — COMPREHENSIVE METABOLIC PANEL
ALT: 30 U/L (ref 0–35)
AST: 45 U/L — AB (ref 0–37)
Albumin: 4.3 g/dL (ref 3.5–5.2)
Alkaline Phosphatase: 94 U/L (ref 39–117)
BILIRUBIN TOTAL: 0.5 mg/dL (ref 0.2–1.2)
BUN: 42 mg/dL — ABNORMAL HIGH (ref 6–23)
CO2: 26 mEq/L (ref 19–32)
Calcium: 10.9 mg/dL — ABNORMAL HIGH (ref 8.4–10.5)
Chloride: 104 mEq/L (ref 96–112)
Creat: 1.26 mg/dL — ABNORMAL HIGH (ref 0.50–1.10)
Glucose, Bld: 89 mg/dL (ref 70–99)
Potassium: 5.1 mEq/L (ref 3.5–5.3)
SODIUM: 139 meq/L (ref 135–145)
Total Protein: 7.4 g/dL (ref 6.0–8.3)

## 2013-11-28 LAB — CBC WITH DIFFERENTIAL/PLATELET
BASOS ABS: 0.1 10*3/uL (ref 0.0–0.1)
Basophils Relative: 1 % (ref 0–1)
EOS ABS: 0.1 10*3/uL (ref 0.0–0.7)
EOS PCT: 2 % (ref 0–5)
HCT: 41 % (ref 36.0–46.0)
Hemoglobin: 13.1 g/dL (ref 12.0–15.0)
LYMPHS ABS: 1.7 10*3/uL (ref 0.7–4.0)
Lymphocytes Relative: 27 % (ref 12–46)
MCH: 24.8 pg — ABNORMAL LOW (ref 26.0–34.0)
MCHC: 32 g/dL (ref 30.0–36.0)
MCV: 77.5 fL — AB (ref 78.0–100.0)
Monocytes Absolute: 0.4 10*3/uL (ref 0.1–1.0)
Monocytes Relative: 7 % (ref 3–12)
NEUTROS PCT: 63 % (ref 43–77)
Neutro Abs: 3.9 10*3/uL (ref 1.7–7.7)
PLATELETS: 263 10*3/uL (ref 150–400)
RBC: 5.29 MIL/uL — AB (ref 3.87–5.11)
RDW: 17.3 % — AB (ref 11.5–15.5)
WBC: 6.2 10*3/uL (ref 4.0–10.5)

## 2013-12-06 ENCOUNTER — Ambulatory Visit: Payer: Medicare Other | Admitting: Family Medicine

## 2013-12-11 ENCOUNTER — Telehealth: Payer: Self-pay

## 2013-12-11 NOTE — Telephone Encounter (Signed)
Talked to patient daughter [name] and she is in a lot of distress. I informed her that you wanted her to take daughter to ED if she is worse per Dr. Tamala Julian, Dr. Everlene Farrier, and Dr. Arneta Cliche (neurosurgeon). [name] the daughter says she doesn't know what to do, last time her mother refused surgery. Now she wants it. But she will not go to the emergency room, daughter says they will not see her because she does not have a "scratch on her" I told her you don't have to be injured to go to the emergency room; if you are mentally deteriorating you can also be seen (thats also an emergency) . She reports her mother "sees people in trees".  I told her that three doctors advised you on what to do. Daughter insist on a referral appointment instead but Dr. Arneta Cliche (neurosurgeon) agrees that patient should go to the emergency room if not doing well and getting worse not wait for an appointment. Please advise Dr. Tamala Julian, I tried to explain to daughter but I feel that she is very stressed. She keeps saying that everyone keeps "bouncing her around". She understands that we are trying to help, but does not want to take mother to ED.   Daughter christina: [phone]

## 2013-12-11 NOTE — Telephone Encounter (Signed)
Daughter is requesting an appt with Neurosurgeon. She states that mother needs help. Mother is not able to be left alone. She states that she is not any worse than she was since she was in the hospital. She only wants to get this evaluation with Dr. [name] as soon as possible. Her mother has finally agreed to go see the Neurosurgeon and have the procedure done. She does not feel that there is a need to go to the ED due to the fact that this condition is not an emergency at this moment. However, the patient should not be left alone during the day. Pt does not qualify for social services due to income being too high. Suggested she call for any day programs she may be able to bring her mother too. She says her mother will not agree to those and these lead to further confusion and arguments with her mother.    She is trying to find some help with her mother. She has not been able to leave her mother alone to work, has to leave work frequently.

## 2013-12-11 NOTE — Telephone Encounter (Signed)
Detailed messages reviewed; thanks for all your work for this patient.  Please call Dr. Lacy Duverney office again for an appointment.  Please explain that patient is now willing to discuss and undergo shunt placement and I am requesting an appointment with NS.

## 2013-12-12 ENCOUNTER — Other Ambulatory Visit (HOSPITAL_COMMUNITY): Payer: Self-pay | Admitting: Internal Medicine

## 2013-12-12 NOTE — Telephone Encounter (Signed)
Spoke to [place]. She is very upset that she has not received the help she is looking for. I tried to explain to her that we are stressing she needs to take her mother to the ED. She declines every attempt at discussing this. Leroy Kennedy has serious concerns that her mother will be discharged home with this altered mental status just like before. I suggested we try to get her into another Neurosurgeons office due to Dr. [name] being the only one in [place]. Daughter agrees and will take her mother wherever we are able to get her into the quickest.

## 2013-12-12 NOTE — Telephone Encounter (Signed)
Christi called back to confirm that she is going to pick up the records left at the front desk. Dr. Lacy Duverney office confirms the appt on Monday at [place] will still consider taking her mother to the ED at [facility] if she has a serious episode this weekend.

## 2013-12-12 NOTE — Telephone Encounter (Signed)
Spoke with patient daughter and that she went and sat at dr [name] office until someone would speak with her and she states Caren Griffins was very rude and not very helpful.  Discussed this with chelle and was told to let the patient know that the hospital is basically the only option-sarah hansen is to be returning the call to the patient daughter

## 2013-12-12 NOTE — Telephone Encounter (Signed)
Below messages noted and agree with plan as outlined.

## 2013-12-12 NOTE — Telephone Encounter (Signed)
Spoke to [place] again. I was able to convince her to take [name] to the ED at [facility] could not get her in until September. Advised her I would print Dr. Thompson Caul last OV and her ED admission notes to bring with her to the ED. I asked her to advocate for her mother just as strongly as she has to me on the phone. Advised her to try to convince her mother she has arranged the appt and take her to the hospital "like a normal appt"  She needs to tell the ED there that she feels her mother is a safety risk to herself and is having mental status changes at home including roaming in the street. [name] agrees with this plan.   While on the phone with Geronimo Boot from Dr. Lacy Duverney office called to let us know that Dr [name] "just got off the phone with [facility] and they have worked out an appt on Monday at [place]".   Called [name] back to find out if she was aware of this appt- [name] had no information in regards to this appt. She is going to call them. She will decide whether to take her mother to the ED or wait for the appt on Monday. She is not convinced the appt. Exists.  Printed Medical history and placed in the pick up drawer for [name] to pick up. I requested she call us back to give an update.

## 2013-12-12 NOTE — Telephone Encounter (Signed)
After sara spoke with daughter referrals is going to try and get patient in with regional physician neurosugery office

## 2013-12-16 ENCOUNTER — Ambulatory Visit (INDEPENDENT_AMBULATORY_CARE_PROVIDER_SITE_OTHER): Payer: Medicare Other | Admitting: Family Medicine

## 2013-12-16 ENCOUNTER — Encounter: Payer: Self-pay | Admitting: Family Medicine

## 2013-12-16 VITALS — BP 158/71 | HR 74 | Temp 98.5°F | Resp 16 | Ht 61.0 in | Wt 116.6 lb

## 2013-12-16 DIAGNOSIS — E785 Hyperlipidemia, unspecified: Secondary | ICD-10-CM

## 2013-12-16 DIAGNOSIS — R441 Visual hallucinations: Secondary | ICD-10-CM

## 2013-12-16 DIAGNOSIS — H5316 Psychophysical visual disturbances: Secondary | ICD-10-CM

## 2013-12-16 DIAGNOSIS — R410 Disorientation, unspecified: Secondary | ICD-10-CM

## 2013-12-16 DIAGNOSIS — R404 Transient alteration of awareness: Secondary | ICD-10-CM

## 2013-12-16 DIAGNOSIS — F17200 Nicotine dependence, unspecified, uncomplicated: Secondary | ICD-10-CM

## 2013-12-16 DIAGNOSIS — R440 Auditory hallucinations: Secondary | ICD-10-CM

## 2013-12-16 DIAGNOSIS — R945 Abnormal results of liver function studies: Secondary | ICD-10-CM

## 2013-12-16 DIAGNOSIS — D496 Neoplasm of unspecified behavior of brain: Secondary | ICD-10-CM

## 2013-12-16 DIAGNOSIS — Z720 Tobacco use: Secondary | ICD-10-CM

## 2013-12-16 DIAGNOSIS — R443 Hallucinations, unspecified: Secondary | ICD-10-CM

## 2013-12-16 DIAGNOSIS — R7989 Other specified abnormal findings of blood chemistry: Secondary | ICD-10-CM

## 2013-12-16 DIAGNOSIS — N289 Disorder of kidney and ureter, unspecified: Secondary | ICD-10-CM

## 2013-12-16 DIAGNOSIS — I10 Essential (primary) hypertension: Secondary | ICD-10-CM

## 2013-12-16 DIAGNOSIS — G911 Obstructive hydrocephalus: Secondary | ICD-10-CM

## 2013-12-16 LAB — COMPLETE METABOLIC PANEL WITH GFR
ALT: 15 U/L (ref 0–35)
AST: 24 U/L (ref 0–37)
Albumin: 4.4 g/dL (ref 3.5–5.2)
Alkaline Phosphatase: 117 U/L (ref 39–117)
BILIRUBIN TOTAL: 0.5 mg/dL (ref 0.2–1.2)
BUN: 28 mg/dL — AB (ref 6–23)
CO2: 24 meq/L (ref 19–32)
CREATININE: 1.72 mg/dL — AB (ref 0.50–1.10)
Calcium: 11.4 mg/dL — ABNORMAL HIGH (ref 8.4–10.5)
Chloride: 105 mEq/L (ref 96–112)
GFR, EST AFRICAN AMERICAN: 33 mL/min — AB
GFR, Est Non African American: 28 mL/min — ABNORMAL LOW
Glucose, Bld: 113 mg/dL — ABNORMAL HIGH (ref 70–99)
Potassium: 4.7 mEq/L (ref 3.5–5.3)
Sodium: 140 mEq/L (ref 135–145)
TOTAL PROTEIN: 7.5 g/dL (ref 6.0–8.3)

## 2013-12-16 NOTE — Progress Notes (Signed)
Subjective:    Patient ID: [name], female    DOB: 14-Nov-1934, 79 y.o.   MRN: [id number]  12/16/2013  Follow-up   HPI This 79 y.o. female presents for two week follow-up:  1.  Delirium: Two week follow-up; has not gone well at home. Today is a relatively good day.  Still hearing people in her attack and thinking that people are taking her medications.  Screaming this weekend during a very bad day.  Forcing herself to vomit because she thinks that food is contaminated.  Non-compliant with medications yet daughter thinks that patient may have taken three days worth in one day.  This has possibly occurred on three different days.  HH came out for an evaluation twice; each visit one week apart. Social worker came to assess needs.  No formal recommendations made.  Pt unable to make coffee which she has been making coffee daily for her entire life.  Now unable to follow the steps to make coffee.  Daughter is going to visit patient in the evenings at 6:30pm to administer medications.  Eating dinner with daughter at night.  Snacking on crackers throughout the day.  Not drinking much during the day.  Previously drank coffee all day long but not able to make coffee independently at this time.  Very agitated on Saturday.  Starts screaming.  Neighbor called because patient in a robe and gown laying on front yard. PT came out for an evaluation; when patient walked up to door, the PT stated "Well you won't be needing my services".    2. Obstructive Hydrocephaly:  Has an appointment with NS today at 5:00pm.  Order for NS consultation faxed on 11/28/13; daughter spent an extensive amount of time on Friday, 6/19 getting an appointment with NS.  Daughter presented to [facility] and NS on Friday. Patient still agreeable to undergo shunting for obstructive hydrocephaly  "because I am not functioning".  Pt not able to prepare coffee and administer medications independently.  Has complained of headaches on two  days.  3. 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  Not resectable; pt not willing to undergo resection but resection was not recommended during admission; only shunting recommended by NS.  4.  Falls: no recent falls in the past three weeks.   5. Elevated LFT: found at visit three weeks ago; due for repeat.  6.  Elevated Creatinine: found at last visit; poor fluid intake.     Review of Systems  Constitutional: Negative for fever, chills, diaphoresis and fatigue.  Eyes: Negative for visual disturbance.  Respiratory: Negative for shortness of breath.   Cardiovascular: Negative for chest pain.  Gastrointestinal: Negative for abdominal pain.  Neurological: Positive for headaches. Negative for dizziness, tremors, seizures, syncope, speech difficulty, weakness, light-headedness and numbness.  Psychiatric/Behavioral: Positive for hallucinations, confusion and dysphoric mood. The patient is nervous/anxious.     Past Medical History  Diagnosis Date  . Hyperlipidemia   . Essential hypertension, benign   . CAD (coronary artery disease)   . Osteoporosis   . Hypercalcemia   . Vitamin D deficiency   . Ovarian tumor   . Breast tumor   . MI (myocardial infarction) July 1995    Treated with LAD PCI- Dr Adora Fridge  . Cataract    Past Surgical History  Procedure Laterality Date  . Angioplasty    . Hip pinning    . Breast lumpectomy    . Abdominal hysterectomy    .  Cholecystectomy    . Fracture surgery  Nov 2003    L distal radius fx- Dr. Frederik Pear  . Eye surgery      Cataract removal    Allergies  Allergen Reactions  . Sulfa Antibiotics Swelling  . Codeine Nausea And Vomiting  . Other Nausea And Vomiting    Some type of pill given for allergic reaction    Current Outpatient Prescriptions  Medication Sig Dispense Refill  . aspirin 81 MG tablet Take 81 mg by mouth daily.      . calcium citrate (CALCITRATE -  DOSED IN MG ELEMENTAL CALCIUM) 950 MG tablet Take 1-2 tablets by mouth daily. 400 mg      . cholecalciferol (VITAMIN D) 1000 UNITS tablet Take 2,000 Units by mouth 2 (two) times daily.       Marland Kitchen ezetimibe (ZETIA) 10 MG tablet Take 1 tablet ($RemoveB'10mg'dARuJeko$  total) by mouth daily. <MUST KEEP APPOINTMENT>  30 tablet  10  . loratadine (CLARITIN) 10 MG tablet Take 10 mg by mouth daily.      Marland Kitchen losartan (COZAAR) 100 MG tablet Take 1 tablet (100 mg total) by mouth daily. Take 1 tablet ($RemoveB'100mg'LytUetlH$  total) by mouth daily. <MUST KEEP APPOINTMENT>  30 tablet  11  . metoprolol succinate (TOPROL-XL) 50 MG 24 hr tablet Take 1 tablet daily  30 tablet  0  . Multiple Vitamin (MULTIVITAMIN WITH MINERALS) TABS tablet Take 1 tablet by mouth daily.  30 tablet  0  . nitrofurantoin, macrocrystal-monohydrate, (MACROBID) 100 MG capsule Take 1 capsule (100 mg total) by mouth 2 (two) times daily.  4 capsule  0  . Polyethyl Glycol-Propyl Glycol (SYSTANE OP) Place 1 drop into both eyes 2 (two) times daily as needed (dry eyes).      . pravastatin (PRAVACHOL) 40 MG tablet Take 1 tablet (40 mg total) by mouth daily.  30 tablet  11  . risperiDONE (RISPERDAL) 0.25 MG tablet Take 1 tablet (0.25 mg total) by mouth 2 (two) times daily.  60 tablet  0  . traZODone (DESYREL) 50 MG tablet Take 1 tablet (50 mg total) by mouth at bedtime.  30 tablet  0   No current facility-administered medications for this visit.   History   Social History  . Marital Status: Widowed    Spouse Name: N/A    Number of Children: 1  . Years of Education: N/A   Occupational History  . retired    Social History Main Topics  . Smoking status: Current Some Day Smoker    Types: Cigarettes  . Smokeless tobacco: Not on file  . Alcohol Use: No  . Drug Use: No  . Sexual Activity: No   Other Topics Concern  . Not on file   Social History Narrative   Marital status: widowed.      Children: one daughter [name] [phone])      Employment: retired; Master's degree.       Lives: alone.      Tobacco: daily smoking.        Objective:    BP 158/71  Pulse 74  Temp(Src) 98.5 F (36.9 C) (Oral)  Resp 16  Ht $R'5\' 1"'yo$  (1.549 m)  Wt 116 lb 9.6 oz (52.889 kg)  BMI 22.04 kg/m2  SpO2 98% Physical Exam  Nursing note and vitals reviewed. Constitutional: She is oriented to person, place, and time. She appears well-developed and well-nourished. No distress.  HENT:  Head: Normocephalic and atraumatic.  Eyes: Conjunctivae and EOM are normal. Pupils  are equal, round, and reactive to light.  Neck: Normal range of motion. Neck supple.  Cardiovascular: Normal rate, regular rhythm and normal heart sounds.  Exam reveals no gallop and no friction rub.   No murmur heard. Pulmonary/Chest: Effort normal and breath sounds normal. She has no wheezes. She has no rales.  Neurological: She is alert and oriented to person, place, and time. No cranial nerve deficit.  Skin: She is not diaphoretic.  Psychiatric: Her behavior is normal. Her speech is tangential. Thought content is paranoid.  Tearful.   Results for orders placed in visit on 11/27/13  CBC WITH DIFFERENTIAL      Result Value Ref Range   WBC 6.2  4.0 - 10.5 K/uL   RBC 5.29 (*) 3.87 - 5.11 MIL/uL   Hemoglobin 13.1  12.0 - 15.0 g/dL   HCT 80.3  21.6 - 16.3 %   MCV 77.5 (*) 78.0 - 100.0 fL   MCH 24.8 (*) 26.0 - 34.0 pg   MCHC 32.0  30.0 - 36.0 g/dL   RDW 28.2 (*) 89.6 - 10.3 %   Platelets 263  150 - 400 K/uL   Neutrophils Relative % 63  43 - 77 %   Neutro Abs 3.9  1.7 - 7.7 K/uL   Lymphocytes Relative 27  12 - 46 %   Lymphs Abs 1.7  0.7 - 4.0 K/uL   Monocytes Relative 7  3 - 12 %   Monocytes Absolute 0.4  0.1 - 1.0 K/uL   Eosinophils Relative 2  0 - 5 %   Eosinophils Absolute 0.1  0.0 - 0.7 K/uL   Basophils Relative 1  0 - 1 %   Basophils Absolute 0.1  0.0 - 0.1 K/uL   Smear Review Criteria for review not met    COMPREHENSIVE METABOLIC PANEL      Result Value Ref Range   Sodium 139  135 - 145 mEq/L    Potassium 5.1  3.5 - 5.3 mEq/L   Chloride 104  96 - 112 mEq/L   CO2 26  19 - 32 mEq/L   Glucose, Bld 89  70 - 99 mg/dL   BUN 42 (*) 6 - 23 mg/dL   Creat 5.11 (*) 7.53 - 1.10 mg/dL   Total Bilirubin 0.5  0.2 - 1.2 mg/dL   Alkaline Phosphatase 94  39 - 117 U/L   AST 45 (*) 0 - 37 U/L   ALT 30  0 - 35 U/L   Total Protein 7.4  6.0 - 8.3 g/dL   Albumin 4.3  3.5 - 5.2 g/dL   Calcium 07.4 (*) 8.4 - 10.5 mg/dL       Assessment & Plan:  Hyperlipidemia - Plan: COMPLETE METABOLIC PANEL WITH GFR  HTN (hypertension), benign  Tobacco user  Delirium  Auditory hallucinations  Visual hallucinations  Acquired obstructive hydrocephalus  Posterior fossa tumor  Acute renal insufficiency  Elevated LFTs  1.  Obstructive hydrocephaly:  Persistent; appointment today at 5:00pm with NS to discuss shunting procedure. 2.  Posterior fossa tumor: stable; minimal growth in past 5-10 years by MRI/CT brain.   3.  Delirium: persistent; poor compliance with Risperdal and Trazodone.  S/p HH consultation with PT, nursing, social work; no apparent services offered other than once weekly nursing visits.  Obtain records.  Advised daughter/Hermina Barnard to administer all medications each evening at 6:30pm with supper.  Discontinue morning Risperdal dose; I do not want patient self administering medications any longer.  Expressed my concern regarding patient  being alone.  Placement may very well be warranted if delirium progresses or worsens.  Daughter advised to present to ED if patient a harm to self or to others. 4.  Auditory and visual hallucinations: persistent; non-compliance with Risperdal and Trazodone; s/p psychiatry consultation in hospital.  No outpatient psychiatry follow-up. Daughter to start administering all medications in the evenings.  To administer Risperdal and Trazodone every night at 6:30pm. Can administer an additional Risperdal as needed with agitation.  Close follow-up. 5.  Renal insufficiency acute:  New.  Repeat BUN/Creatinine today. Encourage improved fluid intake. 6.  Elevated LFTs: New.  Repeat today.  No orders of the defined types were placed in this encounter.    Return in about 2 weeks (around 12/30/2013) for recheck.  Reginia Forts, M.D.  [facility] [address] [place], [place]  [address] [phone] phone [phone] fax

## 2013-12-16 NOTE — Patient Instructions (Signed)
1. Administer all medications every evening at 6:30pm. 2.  Do not administer morning Risperdal dose for now. 3. Follow-up in 2-3 weeks.

## 2013-12-17 ENCOUNTER — Other Ambulatory Visit: Payer: Self-pay | Admitting: Neurosurgery

## 2013-12-17 NOTE — Telephone Encounter (Signed)
Noted  

## 2013-12-17 NOTE — Telephone Encounter (Signed)
Gave daughter results. She says that mer mom may possibly be having surgery this week.

## 2013-12-19 ENCOUNTER — Encounter (HOSPITAL_COMMUNITY): Payer: Self-pay

## 2013-12-19 ENCOUNTER — Encounter (HOSPITAL_COMMUNITY)
Admission: RE | Admit: 2013-12-19 | Discharge: 2013-12-19 | Disposition: A | Discharge status: Home / Self Care | Payer: Medicare Other | Source: Ambulatory Visit | Attending: Neurosurgery | Admitting: Neurosurgery

## 2013-12-19 HISTORY — DX: Other complications of anesthesia, initial encounter: T88.59XA

## 2013-12-19 HISTORY — DX: Other specified postprocedural states: R11.2

## 2013-12-19 HISTORY — DX: Neoplasm of unspecified behavior of respiratory system: D49.1

## 2013-12-19 HISTORY — DX: Adverse effect of unspecified anesthetic, initial encounter: T41.45XA

## 2013-12-19 HISTORY — DX: Other specified postprocedural states: Z98.890

## 2013-12-19 LAB — COMPREHENSIVE METABOLIC PANEL
ALBUMIN: 3.5 g/dL (ref 3.5–5.2)
ALT: 13 U/L (ref 0–35)
AST: 22 U/L (ref 0–37)
Alkaline Phosphatase: 103 U/L (ref 39–117)
BILIRUBIN TOTAL: 0.2 mg/dL — AB (ref 0.3–1.2)
BUN: 25 mg/dL — ABNORMAL HIGH (ref 6–23)
CHLORIDE: 108 meq/L (ref 96–112)
CO2: 25 mEq/L (ref 19–32)
Calcium: 10.5 mg/dL (ref 8.4–10.5)
Creatinine, Ser: 1.28 mg/dL — ABNORMAL HIGH (ref 0.50–1.10)
GFR calc Af Amer: 46 mL/min — ABNORMAL LOW (ref >90)
GFR calc non Af Amer: 39 mL/min — ABNORMAL LOW (ref >90)
Glucose, Bld: 97 mg/dL (ref 70–99)
Potassium: 5.8 mEq/L — ABNORMAL HIGH (ref 3.7–5.3)
SODIUM: 144 meq/L (ref 137–147)
TOTAL PROTEIN: 7 g/dL (ref 6.0–8.3)

## 2013-12-19 LAB — CBC
HCT: 41.1 % (ref 36.0–46.0)
Hemoglobin: 12.8 g/dL (ref 12.0–15.0)
MCH: 26.8 pg (ref 26.0–34.0)
MCHC: 31.1 g/dL (ref 30.0–36.0)
MCV: 86 fL (ref 78.0–100.0)
Platelets: 212 10*3/uL (ref 150–400)
RBC: 4.78 MIL/uL (ref 3.87–5.11)
RDW: 17.7 % — ABNORMAL HIGH (ref 11.5–15.5)
WBC: 6.1 10*3/uL (ref 4.0–10.5)

## 2013-12-19 LAB — PROTIME-INR
INR: 0.96 (ref 0.00–1.49)
Prothrombin Time: 12.8 seconds (ref 11.6–15.2)

## 2013-12-19 NOTE — Pre-Procedure Instructions (Signed)
[name]  12/19/2013   Your procedure is scheduled on:  Saturday, June 27th   Report to the emergency room entrance to be registered at  7:00 AM.  Call this number if you have problems the morning of surgery: [phone]   Remember:   Do not eat food or drink liquids after midnight Friday.   Take these medicines the morning of surgery with A SIP OF WATER: Metoprolol, Claritin.  Systane eye drops    Do not wear jewelry, make-up or nail polish.  Do not wear lotions, powders, or perfumes. You may NOT wear deodorant.  Do not shave underarms & legs 48 hours prior to surgery.    Do not bring valuables to the hospital.  [facility] is not responsible for any belongings or valuables.               Contacts, dentures or bridgework may not be worn into surgery.  Leave suitcase in the car. After surgery it may be brought to your room.  For patients admitted to the hospital, discharge time is determined by your treatment team.                Name and phone number of your driver:    Special Instructions: "Preparing for Surgery" instruction sheet (attached)   Please read over the following fact sheets that you were given: Pain Booklet and Surgical Site Infection Prevention

## 2013-12-19 NOTE — Progress Notes (Signed)
She sees Dr. Gwenlyn Found .  LOV was March 2015.   DA

## 2013-12-20 ENCOUNTER — Inpatient Hospital Stay (HOSPITAL_COMMUNITY): Payer: Medicare Other | Admitting: Certified Registered Nurse Anesthetist

## 2013-12-20 ENCOUNTER — Inpatient Hospital Stay (HOSPITAL_COMMUNITY)
Admission: RE | Admit: 2013-12-20 | Discharge: 2013-12-22 | DRG: 033 | Disposition: A | Discharge status: Home / Self Care | Payer: Medicare Other | Source: Ambulatory Visit | Attending: Neurosurgery | Admitting: Neurosurgery

## 2013-12-20 ENCOUNTER — Encounter (HOSPITAL_COMMUNITY): Payer: Medicare Other | Admitting: Vascular Surgery

## 2013-12-20 ENCOUNTER — Encounter (HOSPITAL_COMMUNITY)
Admission: RE | Disposition: A | Discharge status: Home / Self Care | Payer: Self-pay | Source: Ambulatory Visit | Attending: Neurosurgery

## 2013-12-20 ENCOUNTER — Encounter (HOSPITAL_COMMUNITY): Payer: Self-pay | Admitting: *Deleted

## 2013-12-20 DIAGNOSIS — M810 Age-related osteoporosis without current pathological fracture: Secondary | ICD-10-CM | POA: Diagnosis present

## 2013-12-20 DIAGNOSIS — I10 Essential (primary) hypertension: Secondary | ICD-10-CM | POA: Diagnosis present

## 2013-12-20 DIAGNOSIS — G919 Hydrocephalus, unspecified: Secondary | ICD-10-CM | POA: Diagnosis present

## 2013-12-20 DIAGNOSIS — E785 Hyperlipidemia, unspecified: Secondary | ICD-10-CM | POA: Diagnosis present

## 2013-12-20 DIAGNOSIS — I252 Old myocardial infarction: Secondary | ICD-10-CM

## 2013-12-20 DIAGNOSIS — G911 Obstructive hydrocephalus: Principal | ICD-10-CM | POA: Diagnosis present

## 2013-12-20 DIAGNOSIS — E559 Vitamin D deficiency, unspecified: Secondary | ICD-10-CM | POA: Diagnosis present

## 2013-12-20 HISTORY — PX: VENTRICULOPERITONEAL SHUNT: SHX204

## 2013-12-20 LAB — POCT I-STAT 4, (NA,K, GLUC, HGB,HCT)
Glucose, Bld: 90 mg/dL (ref 70–99)
HCT: 40 % (ref 36.0–46.0)
Hemoglobin: 13.6 g/dL (ref 12.0–15.0)
POTASSIUM: 4.1 meq/L (ref 3.7–5.3)
SODIUM: 143 meq/L (ref 137–147)

## 2013-12-20 SURGERY — SHUNT INSERTION VENTRICULAR-PERITONEAL
Anesthesia: General | Laterality: Right

## 2013-12-20 MED ORDER — EZETIMIBE 10 MG PO TABS
10.0000 mg | ORAL_TABLET | Freq: Every day | ORAL | Status: DC
  Administered 2013-12-21 – 2013-12-22 (×2): 10 mg via ORAL
  Filled 2013-12-20 (×2): qty 1

## 2013-12-20 MED ORDER — LABETALOL HCL 5 MG/ML IV SOLN
10.0000 mg | INTRAVENOUS | Status: DC | PRN
  Administered 2013-12-21 – 2013-12-22 (×2): 20 mg via INTRAVENOUS
  Administered 2013-12-22: 10 mg via INTRAVENOUS
  Filled 2013-12-20: qty 8
  Filled 2013-12-20 (×3): qty 4

## 2013-12-20 MED ORDER — LOSARTAN POTASSIUM 50 MG PO TABS
100.0000 mg | ORAL_TABLET | Freq: Every day | ORAL | Status: DC
  Administered 2013-12-21 – 2013-12-22 (×2): 100 mg via ORAL
  Filled 2013-12-20 (×2): qty 2

## 2013-12-20 MED ORDER — SIMVASTATIN 20 MG PO TABS
20.0000 mg | ORAL_TABLET | Freq: Every day | ORAL | Status: DC
  Administered 2013-12-20 – 2013-12-21 (×2): 20 mg via ORAL
  Filled 2013-12-20 (×3): qty 1

## 2013-12-20 MED ORDER — ASPIRIN EC 81 MG PO TBEC
81.0000 mg | DELAYED_RELEASE_TABLET | Freq: Every day | ORAL | Status: DC
  Administered 2013-12-21 – 2013-12-22 (×2): 81 mg via ORAL
  Filled 2013-12-20 (×2): qty 1

## 2013-12-20 MED ORDER — HYDRALAZINE HCL 20 MG/ML IJ SOLN
5.0000 mg | INTRAMUSCULAR | Status: DC
  Administered 2013-12-20: 5 mg via INTRAVENOUS

## 2013-12-20 MED ORDER — ONDANSETRON HCL 4 MG/2ML IJ SOLN
INTRAMUSCULAR | Status: AC
  Filled 2013-12-20: qty 2

## 2013-12-20 MED ORDER — SENNA 8.6 MG PO TABS
1.0000 | ORAL_TABLET | Freq: Two times a day (BID) | ORAL | Status: DC
  Administered 2013-12-20 – 2013-12-22 (×4): 8.6 mg via ORAL
  Filled 2013-12-20 (×5): qty 1

## 2013-12-20 MED ORDER — POLYVINYL ALCOHOL 1.4 % OP SOLN
1.0000 [drp] | Freq: Two times a day (BID) | OPHTHALMIC | Status: DC | PRN
  Administered 2013-12-20: 1 [drp] via OPHTHALMIC
  Filled 2013-12-20: qty 15

## 2013-12-20 MED ORDER — PANTOPRAZOLE SODIUM 40 MG IV SOLR
40.0000 mg | Freq: Every day | INTRAVENOUS | Status: DC
  Administered 2013-12-20: 40 mg via INTRAVENOUS
  Filled 2013-12-20 (×2): qty 40

## 2013-12-20 MED ORDER — LORATADINE 10 MG PO TABS
10.0000 mg | ORAL_TABLET | Freq: Every day | ORAL | Status: DC
  Administered 2013-12-21 – 2013-12-22 (×2): 10 mg via ORAL
  Filled 2013-12-20 (×2): qty 1

## 2013-12-20 MED ORDER — METOPROLOL SUCCINATE ER 25 MG PO TB24
50.0000 mg | ORAL_TABLET | Freq: Every day | ORAL | Status: DC
  Administered 2013-12-21 – 2013-12-22 (×2): 50 mg via ORAL
  Filled 2013-12-20: qty 2
  Filled 2013-12-20: qty 1

## 2013-12-20 MED ORDER — CALCIUM CARBONATE 1250 (500 CA) MG PO TABS
1.0000 | ORAL_TABLET | Freq: Every day | ORAL | Status: DC
  Administered 2013-12-21 – 2013-12-22 (×2): 500 mg via ORAL
  Filled 2013-12-20 (×2): qty 1

## 2013-12-20 MED ORDER — PROPOFOL 10 MG/ML IV BOLUS
INTRAVENOUS | Status: DC | PRN
  Administered 2013-12-20: 20 mg via INTRAVENOUS
  Administered 2013-12-20: 140 mg via INTRAVENOUS

## 2013-12-20 MED ORDER — HYDRALAZINE HCL 20 MG/ML IJ SOLN
INTRAMUSCULAR | Status: AC
Start: 2013-12-20 — End: 2013-12-21
  Filled 2013-12-20: qty 1

## 2013-12-20 MED ORDER — HYDROCODONE-ACETAMINOPHEN 5-325 MG PO TABS
1.0000 | ORAL_TABLET | ORAL | Status: DC | PRN
  Administered 2013-12-20 – 2013-12-22 (×5): 1 via ORAL
  Filled 2013-12-20 (×5): qty 1

## 2013-12-20 MED ORDER — LABETALOL HCL 5 MG/ML IV SOLN
INTRAVENOUS | Status: DC | PRN
  Administered 2013-12-20 (×4): 5 mg via INTRAVENOUS

## 2013-12-20 MED ORDER — PHENYLEPHRINE 40 MCG/ML (10ML) SYRINGE FOR IV PUSH (FOR BLOOD PRESSURE SUPPORT)
PREFILLED_SYRINGE | INTRAVENOUS | Status: AC
  Filled 2013-12-20: qty 10

## 2013-12-20 MED ORDER — ONDANSETRON HCL 4 MG/2ML IJ SOLN
4.0000 mg | INTRAMUSCULAR | Status: DC | PRN
  Administered 2013-12-21: 4 mg via INTRAVENOUS
  Filled 2013-12-20: qty 2

## 2013-12-20 MED ORDER — FENTANYL CITRATE 0.05 MG/ML IJ SOLN
INTRAMUSCULAR | Status: AC
  Filled 2013-12-20: qty 5

## 2013-12-20 MED ORDER — ARTIFICIAL TEARS OP OINT
TOPICAL_OINTMENT | OPHTHALMIC | Status: DC | PRN
  Administered 2013-12-20: 1 via OPHTHALMIC

## 2013-12-20 MED ORDER — PROPOFOL 10 MG/ML IV BOLUS
INTRAVENOUS | Status: AC
  Filled 2013-12-20: qty 20

## 2013-12-20 MED ORDER — ADULT MULTIVITAMIN W/MINERALS CH
1.0000 | ORAL_TABLET | Freq: Every day | ORAL | Status: DC
  Administered 2013-12-21 – 2013-12-22 (×2): 1 via ORAL
  Filled 2013-12-20 (×2): qty 1

## 2013-12-20 MED ORDER — MAGNESIUM CITRATE PO SOLN
1.0000 | Freq: Once | ORAL | Status: AC | PRN
  Filled 2013-12-20: qty 296

## 2013-12-20 MED ORDER — ACETAMINOPHEN 325 MG PO TABS: 650.0000 mg | ORAL_TABLET | ORAL | Status: DC | PRN

## 2013-12-20 MED ORDER — HEMOSTATIC AGENTS (NO CHARGE) OPTIME
TOPICAL | Status: DC | PRN
  Administered 2013-12-20: 1 via TOPICAL

## 2013-12-20 MED ORDER — PROMETHAZINE HCL 25 MG PO TABS: 12.5000 mg | ORAL_TABLET | ORAL | Status: DC | PRN

## 2013-12-20 MED ORDER — CALCIUM CITRATE 950 (200 CA) MG PO TABS: 400.0000 mg | ORAL_TABLET | Freq: Every day | ORAL | Status: DC

## 2013-12-20 MED ORDER — ROCURONIUM BROMIDE 50 MG/5ML IV SOLN
INTRAVENOUS | Status: AC
  Filled 2013-12-20: qty 1

## 2013-12-20 MED ORDER — GLYCOPYRROLATE 0.2 MG/ML IJ SOLN
INTRAMUSCULAR | Status: DC | PRN
  Administered 2013-12-20: 0.4 mg via INTRAVENOUS

## 2013-12-20 MED ORDER — POLYETHYL GLYCOL-PROPYL GLYCOL 0.4-0.3 % OP SOLN: 1.0000 [drp] | Freq: Two times a day (BID) | OPHTHALMIC | Status: DC | PRN

## 2013-12-20 MED ORDER — VITAMIN D 1000 UNITS PO TABS
2000.0000 [IU] | ORAL_TABLET | Freq: Two times a day (BID) | ORAL | Status: DC
  Administered 2013-12-20 – 2013-12-22 (×4): 2000 [IU] via ORAL
  Filled 2013-12-20 (×5): qty 2

## 2013-12-20 MED ORDER — GLYCOPYRROLATE 0.2 MG/ML IJ SOLN
INTRAMUSCULAR | Status: AC
  Filled 2013-12-20: qty 3

## 2013-12-20 MED ORDER — MIDAZOLAM HCL 2 MG/2ML IJ SOLN
INTRAMUSCULAR | Status: AC
  Filled 2013-12-20: qty 2

## 2013-12-20 MED ORDER — LIDOCAINE HCL (CARDIAC) 20 MG/ML IV SOLN
INTRAVENOUS | Status: AC
  Filled 2013-12-20: qty 5

## 2013-12-20 MED ORDER — LACTATED RINGERS IV SOLN
INTRAVENOUS | Status: DC | PRN
  Administered 2013-12-20 (×2): via INTRAVENOUS

## 2013-12-20 MED ORDER — ONDANSETRON HCL 4 MG PO TABS: 4.0000 mg | ORAL_TABLET | ORAL | Status: DC | PRN

## 2013-12-20 MED ORDER — ROCURONIUM BROMIDE 100 MG/10ML IV SOLN
INTRAVENOUS | Status: DC | PRN
  Administered 2013-12-20: 40 mg via INTRAVENOUS

## 2013-12-20 MED ORDER — NEOSTIGMINE METHYLSULFATE 10 MG/10ML IV SOLN
INTRAVENOUS | Status: AC
  Filled 2013-12-20: qty 1

## 2013-12-20 MED ORDER — THROMBIN 5000 UNITS EX SOLR
CUTANEOUS | Status: DC | PRN
  Administered 2013-12-20 (×2): 5000 [IU] via TOPICAL

## 2013-12-20 MED ORDER — LACTATED RINGERS IV SOLN
INTRAVENOUS | Status: DC
  Administered 2013-12-20: 14:00:00 via INTRAVENOUS

## 2013-12-20 MED ORDER — LABETALOL HCL 5 MG/ML IV SOLN
INTRAVENOUS | Status: AC
  Filled 2013-12-20: qty 4

## 2013-12-20 MED ORDER — NEOSTIGMINE METHYLSULFATE 10 MG/10ML IV SOLN
INTRAVENOUS | Status: DC | PRN
  Administered 2013-12-20: 3 mg via INTRAVENOUS

## 2013-12-20 MED ORDER — 0.9 % SODIUM CHLORIDE (POUR BTL) OPTIME
TOPICAL | Status: DC | PRN
  Administered 2013-12-20: 1000 mL

## 2013-12-20 MED ORDER — HYDROMORPHONE HCL PF 1 MG/ML IJ SOLN: 0.2500 mg | INTRAMUSCULAR | Status: DC | PRN

## 2013-12-20 MED ORDER — MORPHINE SULFATE 2 MG/ML IJ SOLN: 1.0000 mg | INTRAMUSCULAR | Status: DC | PRN

## 2013-12-20 MED ORDER — LIDOCAINE HCL (CARDIAC) 20 MG/ML IV SOLN
INTRAVENOUS | Status: DC | PRN
  Administered 2013-12-20: 60 mg via INTRAVENOUS

## 2013-12-20 MED ORDER — RISPERIDONE 0.5 MG PO TABS
0.2500 mg | ORAL_TABLET | Freq: Two times a day (BID) | ORAL | Status: DC
  Administered 2013-12-20 – 2013-12-22 (×4): 0.25 mg via ORAL
  Filled 2013-12-20 (×5): qty 1

## 2013-12-20 MED ORDER — PROMETHAZINE HCL 25 MG/ML IJ SOLN: 6.2500 mg | INTRAMUSCULAR | Status: DC | PRN

## 2013-12-20 MED ORDER — FENTANYL CITRATE 0.05 MG/ML IJ SOLN
INTRAMUSCULAR | Status: DC | PRN
  Administered 2013-12-20 (×2): 50 ug via INTRAVENOUS
  Administered 2013-12-20: 100 ug via INTRAVENOUS

## 2013-12-20 MED ORDER — ONDANSETRON HCL 4 MG/2ML IJ SOLN
INTRAMUSCULAR | Status: DC | PRN
  Administered 2013-12-20: 4 mg via INTRAVENOUS

## 2013-12-20 MED ORDER — POTASSIUM CHLORIDE IN NACL 20-0.9 MEQ/L-% IV SOLN
INTRAVENOUS | Status: DC
  Administered 2013-12-20 – 2013-12-21 (×2): via INTRAVENOUS
  Filled 2013-12-20 (×5): qty 1000

## 2013-12-20 MED ORDER — ARTIFICIAL TEARS OP OINT
TOPICAL_OINTMENT | OPHTHALMIC | Status: AC
  Filled 2013-12-20: qty 3.5

## 2013-12-20 MED ORDER — TRAZODONE HCL 50 MG PO TABS
50.0000 mg | ORAL_TABLET | Freq: Every day | ORAL | Status: DC
  Administered 2013-12-20 – 2013-12-21 (×2): 50 mg via ORAL
  Filled 2013-12-20 (×3): qty 1

## 2013-12-20 MED ORDER — BISACODYL 5 MG PO TBEC
5.0000 mg | DELAYED_RELEASE_TABLET | Freq: Every day | ORAL | Status: DC | PRN
  Filled 2013-12-20: qty 1

## 2013-12-20 MED ORDER — SENNOSIDES-DOCUSATE SODIUM 8.6-50 MG PO TABS
1.0000 | ORAL_TABLET | Freq: Every evening | ORAL | Status: DC | PRN
  Filled 2013-12-20: qty 1

## 2013-12-20 MED ORDER — CEFAZOLIN SODIUM-DEXTROSE 2-3 GM-% IV SOLR
2.0000 g | INTRAVENOUS | Status: AC
  Administered 2013-12-20: 2 g via INTRAVENOUS
  Filled 2013-12-20: qty 50

## 2013-12-20 MED ORDER — ACETAMINOPHEN 650 MG RE SUPP: 650.0000 mg | RECTAL | Status: DC | PRN

## 2013-12-20 MED ORDER — NALOXONE HCL 0.4 MG/ML IJ SOLN: 0.0800 mg | INTRAMUSCULAR | Status: DC | PRN

## 2013-12-20 MED ORDER — LIDOCAINE-EPINEPHRINE 0.5 %-1:200000 IJ SOLN
INTRAMUSCULAR | Status: DC | PRN
  Administered 2013-12-20: 7.5 mL via INTRADERMAL

## 2013-12-20 SURGICAL SUPPLY — 94 items
BLADE 10 SAFETY STRL DISP (BLADE) ×3 IMPLANT
BLADE SURG 10 STRL SS (BLADE) ×3 IMPLANT
BLADE SURG 11 STRL SS (BLADE) ×3 IMPLANT
BLADE SURG 15 STRL LF DISP TIS (BLADE) ×1 IMPLANT
BLADE SURG 15 STRL SS (BLADE) ×2
BLADE SURG ROTATE 9660 (MISCELLANEOUS) ×3 IMPLANT
BOOT SUTURE AID YELLOW STND (SUTURE) ×3 IMPLANT
BRUSH SCRUB EZ 1% IODOPHOR (MISCELLANEOUS) IMPLANT
BUR ACORN 6.0 PRECISION (BURR) ×2 IMPLANT
BUR ACORN 6.0MM PRECISION (BURR) ×1
CANISTER SUCT 3000ML (MISCELLANEOUS) ×3 IMPLANT
CLIP RANEY DISP (INSTRUMENTS) IMPLANT
CLOSURE WOUND 1/4X4 (GAUZE/BANDAGES/DRESSINGS)
CONT SPEC 4OZ CLIKSEAL STRL BL (MISCELLANEOUS) IMPLANT
CORDS BIPOLAR (ELECTRODE) ×3 IMPLANT
COVER MAYO STAND STRL (DRAPES) ×3 IMPLANT
COVER TABLE BACK 60X90 (DRAPES) ×6 IMPLANT
DECANTER SPIKE VIAL GLASS SM (MISCELLANEOUS) ×3 IMPLANT
DERMABOND ADVANCED (GAUZE/BANDAGES/DRESSINGS) ×4
DERMABOND ADVANCED .7 DNX12 (GAUZE/BANDAGES/DRESSINGS) ×2 IMPLANT
DRAPE INCISE IOBAN 85X60 (DRAPES) ×3 IMPLANT
DRAPE ORTHO SPLIT 77X108 STRL (DRAPES) ×2
DRAPE POUCH INSTRU U-SHP 10X18 (DRAPES) ×3 IMPLANT
DRAPE PROXIMA HALF (DRAPES) ×3 IMPLANT
DRAPE SURG ORHT 6 SPLT 77X108 (DRAPES) ×1 IMPLANT
DRSG OPSITE 4X5.5 SM (GAUZE/BANDAGES/DRESSINGS) ×3 IMPLANT
DRSG TELFA 3X8 NADH (GAUZE/BANDAGES/DRESSINGS) ×3 IMPLANT
DURAPREP 26ML APPLICATOR (WOUND CARE) ×6 IMPLANT
ELECT CAUTERY BLADE 6.4 (BLADE) ×3 IMPLANT
ELECT REM PT RETURN 9FT ADLT (ELECTROSURGICAL) ×3
ELECTRODE REM PT RTRN 9FT ADLT (ELECTROSURGICAL) ×1 IMPLANT
GAUZE SPONGE 4X4 16PLY XRAY LF (GAUZE/BANDAGES/DRESSINGS) ×6 IMPLANT
GLOVE BIO SURGEON STRL SZ 6.5 (GLOVE) IMPLANT
GLOVE BIO SURGEON STRL SZ7 (GLOVE) IMPLANT
GLOVE BIO SURGEON STRL SZ7.5 (GLOVE) IMPLANT
GLOVE BIO SURGEON STRL SZ8 (GLOVE) IMPLANT
GLOVE BIO SURGEON STRL SZ8.5 (GLOVE) ×3 IMPLANT
GLOVE BIO SURGEONS STRL SZ 6.5 (GLOVE)
GLOVE BIOGEL M 8.0 STRL (GLOVE) IMPLANT
GLOVE ECLIPSE 6.5 STRL STRAW (GLOVE) ×3 IMPLANT
GLOVE ECLIPSE 7.0 STRL STRAW (GLOVE) IMPLANT
GLOVE ECLIPSE 7.5 STRL STRAW (GLOVE) IMPLANT
GLOVE ECLIPSE 8.0 STRL XLNG CF (GLOVE) IMPLANT
GLOVE ECLIPSE 8.5 STRL (GLOVE) IMPLANT
GLOVE EXAM NITRILE LRG STRL (GLOVE) IMPLANT
GLOVE EXAM NITRILE MD LF STRL (GLOVE) IMPLANT
GLOVE EXAM NITRILE XL STR (GLOVE) IMPLANT
GLOVE EXAM NITRILE XS STR PU (GLOVE) IMPLANT
GLOVE INDICATOR 6.5 STRL GRN (GLOVE) IMPLANT
GLOVE INDICATOR 7.0 STRL GRN (GLOVE) IMPLANT
GLOVE INDICATOR 7.5 STRL GRN (GLOVE) IMPLANT
GLOVE INDICATOR 8.0 STRL GRN (GLOVE) IMPLANT
GLOVE INDICATOR 8.5 STRL (GLOVE) IMPLANT
GLOVE OPTIFIT SS 8.0 STRL (GLOVE) IMPLANT
GLOVE SS BIOGEL STRL SZ 8 (GLOVE) ×1 IMPLANT
GLOVE SUPERSENSE BIOGEL SZ 8 (GLOVE) ×2
GLOVE SURG SS PI 6.5 STRL IVOR (GLOVE) IMPLANT
GOWN STRL REUS W/ TWL LRG LVL3 (GOWN DISPOSABLE) ×1 IMPLANT
GOWN STRL REUS W/ TWL XL LVL3 (GOWN DISPOSABLE) ×1 IMPLANT
GOWN STRL REUS W/TWL 2XL LVL3 (GOWN DISPOSABLE) IMPLANT
GOWN STRL REUS W/TWL LRG LVL3 (GOWN DISPOSABLE) ×2
GOWN STRL REUS W/TWL XL LVL3 (GOWN DISPOSABLE) ×2
HEMOSTAT SURGICEL 2X14 (HEMOSTASIS) IMPLANT
KIT BASIN OR (CUSTOM PROCEDURE TRAY) ×3 IMPLANT
KIT ROOM TURNOVER OR (KITS) ×3 IMPLANT
MARKER SKIN DUAL TIP RULER LAB (MISCELLANEOUS) ×3 IMPLANT
NEEDLE HYPO 25X1 1.5 SAFETY (NEEDLE) ×3 IMPLANT
NS IRRIG 1000ML POUR BTL (IV SOLUTION) ×3 IMPLANT
PACK EENT II TURBAN DRAPE (CUSTOM PROCEDURE TRAY) ×3 IMPLANT
PAD ARMBOARD 7.5X6 YLW CONV (MISCELLANEOUS) ×9 IMPLANT
PATTIES SURGICAL .5 X.5 (GAUZE/BANDAGES/DRESSINGS) IMPLANT
PENCIL BUTTON HOLSTER BLD 10FT (ELECTRODE) ×3 IMPLANT
SPONGE LAP 4X18 X RAY DECT (DISPOSABLE) ×3 IMPLANT
SPONGE SURGIFOAM ABS GEL 12-7 (HEMOSTASIS) IMPLANT
SPONGE SURGIFOAM ABS GEL SZ50 (HEMOSTASIS) ×3 IMPLANT
STAPLER SKIN PROX WIDE 3.9 (STAPLE) ×3 IMPLANT
STRIP CLOSURE SKIN 1/4X4 (GAUZE/BANDAGES/DRESSINGS) IMPLANT
SUT BONE WAX W31G (SUTURE) ×3 IMPLANT
SUT ETHILON 3 0 PS 1 (SUTURE) ×3 IMPLANT
SUT NURALON 4 0 TR CR/8 (SUTURE) IMPLANT
SUT SILK 0 TIES 10X30 (SUTURE) ×3 IMPLANT
SUT SILK 3 0 SH 30 (SUTURE) IMPLANT
SUT VIC AB 2-0 CT2 18 VCP726D (SUTURE) ×6 IMPLANT
SUT VIC AB 3-0 SH 8-18 (SUTURE) ×3 IMPLANT
SYR BULB 3OZ (MISCELLANEOUS) ×3 IMPLANT
SYR CONTROL 10ML LL (SYRINGE) ×6 IMPLANT
TOWEL OR 17X24 6PK STRL BLUE (TOWEL DISPOSABLE) ×3 IMPLANT
TOWEL OR 17X26 10 PK STRL BLUE (TOWEL DISPOSABLE) ×3 IMPLANT
TRAY FOLEY CATH 14FRSI W/METER (CATHETERS) ×3 IMPLANT
TUBE CONNECTING 12'X1/4 (SUCTIONS) ×1
TUBE CONNECTING 12X1/4 (SUCTIONS) ×2 IMPLANT
UNDERPAD 30X30 INCONTINENT (UNDERPADS AND DIAPERS) ×3 IMPLANT
VALVE RT ANGLE UNITIZE DIST (Valve) ×3 IMPLANT
WATER STERILE IRR 1000ML POUR (IV SOLUTION) ×3 IMPLANT

## 2013-12-20 NOTE — H&P (Signed)
BP 196/79  Pulse 70  Temp(Src) 97.1 F (36.2 C) (Oral)  Resp 18  Wt 55.339 kg (122 lb)  SpO2 100%' HISTORY OF PRESENT ILLNESS:                     [name] comes in today.  This is a patient of Dr. Clarice Pole whom we had seen once in our hospital for a consult.  At the time of his consult, he recommended that she undergo a ventriculoperitoneal shunt.  She however refused.  She is a 79 year old woman with a posterior fossa tumor diagnosed at the very least in 2010.  Upon the admission that Dr. Ellene Route saw her in May of 2015, she was found on the side of the road confused and unaware of how to get home.  CT scan showed a large ventricles.  MRI confirmed a fourth ventricular mass and then looking back at old films, it was apparent that this mass had been there.     It was found upon admission that a ventricle has not changed in size from at least 2010.     PAST MEDICAL HISTORY:              Current Medical Conditions:  Significant for hyperlipidemia, essential hypertension, coronary artery disease, osteoporosis, hypercalcemia, vitamin D deficiency, ovarian tumor breast, tumor myocardial infarction, and cataracts.              Prior Operations:  She has undergone angioplasty, hip pinning, breast lumpectomy, abdominal hysterectomy, cholecystectomy, left distal radius fracture surgery and cataract removal.              Medications and Allergies:  SHE HAS ALLERGIES TO SULFA AND CODEINE.     FAMILY HISTORY:                                            Significant for cerebrovascular disease, heart disease, and cancer.     PHYSICAL EXAMINATION:                                Today on examination, she is 119 pounds and 5 feet 1 inch.  Temperature of 97, blood pressure 169/75, and pulse of 65.  BMI is 22.48.     She is alert, oriented x4, and answers all questions appropriately.  She does get her age wrong one time, but is easily corrected with one prompt.  Pupils equal, round, and  reactive to light.  She has full extraocular movements.  Full visual fields.  Hearing intact to voice, though diminished slightly.  Symmetric facial sensation and movement.  Uvula elevates to midline.  Shoulder shrug is normal.  Tongue protrudes in the midline.  She has 5/5 strength in both upper and lower extremities.  No drift on examination.  Negative Romberg test.  Gait is steady today.     IMPRESSION/PLAN:                                         I asked [name]'s questions about a ventriculoperitoneal shunt.  CT and MRIs show large ventricles and is possibility that this is contributing to the mental status changes, which she has had and have progressed with  time.  Dr. Ellene Route thought that it was a reasonable thing to do and I would concur.  At this point in time, she has no problems with the shunt procedure she previously got that the tumor was going to be removed and she had been told that let no one touch the tumor.  So right now given the risks and benefits, bleeding, infection, no relief, subdural hematomas, brain damage, no relief of her symptoms, she has agreed to undergo ventriculoperitoneal shunting.  We will try to get this scheduled on Saturday but given the amount of gastric surgery that she has had that may have to have a general surgeon help.

## 2013-12-20 NOTE — Op Note (Signed)
12/20/2013  5:14 PM  PATIENT:  [name]  79 y.o. female with apparent obstructive hydrocephalus due to a posterior fossa mass. She has had a recent cognitive decline. I have recommended that she undergo placement of a ventriculoperitoneal shunt  PRE-OPERATIVE DIAGNOSIS:  obstructive hydrocephalus  POST-OPERATIVE DIAGNOSIS:  obstructive hydrocephalus  PROCEDURE:  Procedure(s):right  SHUNT INSERTION VENTRICULAR-PERITONEAL, codman hakim programmable valve set at 111mmH2O  SURGEON:  Surgeon(s): Winfield Cunas, MD Ophelia Charter, MD  ASSISTANTS:Jenkins, Dellis Filbert.   ANESTHESIA:   general  EBL:  Total I/O In: 1000 [I.V.:1000] Out: 75 [Urine:75]  BLOOD ADMINISTERED:none  COUNT:per nursing  DRAINS: none   SPECIMEN:  No Specimen  DICTATION: Mrs. Freund was taken to the operating room, intubated and placed under a general anesthetic without difficulty. She was positioned supine on the operating room table with her head turned towards the left side. The head was positioned on a doughnut, and a shoulder roll placed under the right shoulder. Her head was shaved ,then along with the neck and abdomen was prepped and draped in a sterile manner. I infiltrated  A total of 8cc lidocaine into the planned abdominal incision just off the midline in the right upper quadrant, and the scalp incision.  I opened the abdominal incision with a 10 blade and then dissected down to the anterior rectal sheath. I opened the sheath with Metzenbaum scissors horizontally. I then split the rectus abdominus bluntly vertically and identified the posterior rectal sheath. I opened that with Metzenbaum scissors just enough to expose the peritoneum. I opened the peritoneum with Metzenbaum scissors and placed hemostats on the edges of the opening.  I then tunneled the shunt passer from the abdominal incision to the post auricular region. I used a scalpel to open the scalp over the shunt passer, then brought the  shunt passer out of that opening. I placed a long tie into the hole in the shunt passer and pulled it in a retrograde fashion to the abdominal incision bringing the tie with it. I then tunneled again from the scalp incision to the post auricular incision again using a silk tie. I made my scalp incision for the ventricular catheter anterior to the tragus, exposing the skull. I made a burr hole in line with the  Right  Pupil. I pulled the shunt from the scalp incision to the post auricular incision, then to the abdominal incision.  Opened the dura, and placed the ventricular catheter into the lateral ventricle. With Dr. Arnoldo Morale assistance we secured the catheter to the rest of the shunt then checked its integrity. We had good flow from the end of the catheter. We placed the distal catheter into the peritoneum.  At this point we closed all of the incisions with vicryl sutures, and placed sterile dressings. I did use staples on the scalp and post auricular incision.   PLAN OF CARE: Admit to inpatient   PATIENT DISPOSITION:  PACU - hemodynamically stable.   Delay start of Pharmacological VTE agent (>24hrs) due to surgical blood loss or risk of bleeding:  yes

## 2013-12-20 NOTE — Anesthesia Preprocedure Evaluation (Signed)
Anesthesia Evaluation  Patient identified by MRN, date of birth, ID band Patient awake and Patient confused    Reviewed: Allergy & Precautions, H&P , NPO status , Patient's Chart, lab work & pertinent test results, reviewed documented beta blocker date and time   History of Anesthesia Complications (+) PONV and history of anesthetic complications  Airway Mallampati: II TM Distance: >3 FB Neck ROM: Full    Dental  (+) Edentulous Upper, Dental Advisory Given   Pulmonary former smoker,  breath sounds clear to auscultation  Pulmonary exam normal       Cardiovascular hypertension, Pt. on medications and Pt. on home beta blockers + CAD and + Past MI Rhythm:Regular Rate:Normal     Neuro/Psych    GI/Hepatic negative GI ROS, Neg liver ROS,   Endo/Other  negative endocrine ROS  Renal/GU negative Renal ROS     Musculoskeletal   Abdominal   Peds  Hematology negative hematology ROS (+)   Anesthesia Other Findings   Reproductive/Obstetrics                           Anesthesia Physical Anesthesia Plan  ASA: III  Anesthesia Plan: General   Post-op Pain Management:    Induction: Intravenous  Airway Management Planned: Oral ETT  Additional Equipment:   Intra-op Plan:   Post-operative Plan:   Informed Consent: I have reviewed the patients History and Physical, chart, labs and discussed the procedure including the risks, benefits and alternatives for the proposed anesthesia with the patient or authorized representative who has indicated his/her understanding and acceptance.   Dental advisory given  Plan Discussed with: CRNA, Anesthesiologist and Surgeon  Anesthesia Plan Comments:         Anesthesia Quick Evaluation

## 2013-12-20 NOTE — Anesthesia Postprocedure Evaluation (Signed)
  Anesthesia Post-op Note  Patient: [name]  Procedure(s) Performed: Procedure(s) with comments: SHUNT INSERTION VENTRICULAR-PERITONEAL (Right) - VP shunt insertion  Patient Location: PACU  Anesthesia Type:General  Level of Consciousness: awake and alert   Airway and Oxygen Therapy: Patient Spontanous Breathing  Post-op Pain: mild  Post-op Assessment: Post-op Vital signs reviewed, Patient's Cardiovascular Status Stable and Respiratory Function Stable  Post-op Vital Signs: Reviewed  Filed Vitals:   12/20/13 1815  BP: 148/77  Pulse:   Temp:   Resp:     Complications: No apparent anesthesia complications

## 2013-12-20 NOTE — Progress Notes (Signed)
Anesthesia Chart Review:  Patient is a 79 year old female scheduled for VP shunt insertion on 12/21/13 by Dr. [name]. She has a known posterior fossa tumor diagnosed in 2010.  She presented to the ED (found wandering on the road a mile from her house with no real recollection) by EMS on 11/20/13.  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  VP shunt was recommended, but she refused at that time.  Her acute encephalopathy/possible syncope/delirum was felt multifactorial secondary to [name] (treated with Rocephin), dehydration, and hydrocephalus.  She was also seen by psychiatry due to visual hallucinations. Work-up also revealed a a right lung lesion with out-patient follow-up planned.     Other history includes CAD/MI s/p LAD PCI '95, HTN, smoking, post-operative N/V, osteoporosis, hypercalcemia, ovarian tumor (not specified), breast tumor s/p lumpectomy (not specified), hysterectomy, cholecystectomy. PCP is [name]. Cardiologist is [name], last visit 09/24/13.  She was not having an CV symptoms at that time and no additional testing was ordered.  EKG on 11/20/13 showed: SR, consider RAE, borderline repolarization abnormality, minimal ST elevation, anterior leads (primarily V1), no significant change since last tracing. (Troponins negative.)  Echo on 11/22/13 showed: - Procedure narrative: Transthoracic echocardiography. Image quality was adequate. The study was technically difficult. - Left ventricle: The cavity size was normal. Wall thickness was normal. Systolic function was normal. The estimated ejection fraction was in the range of 60% to 65%. Wall motion was normal; there were no regional wall motion abnormalities. There was an increased relative contribution of atrial contraction to ventricular filling.  [name] notes indicate that she had a non-ischemic Myoveiw on 05/28/09.  CXR on  11/20/13 showed: 1. Apparent spiculated 1.4 cm nodule at the right lung apex, though this could reflect overlying osseous structures. CT of the chest would be helpful for further evaluation, on an elective nonemergent basis.  2. Findings of COPD.  CTAof the chest on 11/21/13 showed:  1. No evidence of pulmonary embolus.  2. 1.2 cm mildly lobulated nodule at the posterior aspect of the right upper lung lobe. This demonstrates slight spiculation and pleural extension. Malignancy cannot be excluded. PET/CT could be considered for further evaluation, or alternatively, tissue diagnosis could be considered.  3. Minimal bilateral atelectasis noted; lungs otherwise clear.  4. Scattered coronary artery calcifications seen.  5. Possible small hepatic cyst.  6. Mild aneurysmal dilatation of the infrarenal abdominal aorta, measuring up to 3.2 cm in AP dimension, with associated mural thrombus.  7. Mild bilateral adrenal hyperplasia.  8. Mild chronic compression deformity involving vertebral bodies T7 and L1. (She was seen by pulmonologist [name] on 11/20/13. He felt nodule could be further evaluated on an out-patient basis and pulmonary referral if patient wished to pursue diagnosis of possible lung malignancy. Notes from her PCP [name] on 11/27/13 indicate an out-patient PET scan is planned.)  Preoperative labs noted. K 5.8. There is no mention of hemolysis.  BUN/Cr 25/1.28, which is down from 28/1.72 on 12/16/13.  AST/ALT WNL. H/H 12.8/41.1. PT/INR WNL. I called result of K+ to [name] at Dr. Lacy Duverney office.  I will order an ISTAT on arrival to re-evaluate.  Will defer additional recommendations to Dr. [name], if any.  She has known CAD but with cardiology follow-up within the past three months with no new cardiac testing recommended.  Her LVEF and wall motion were normal by echo last month.  She had  recent altered mental status with hydrocephalus felt to be playing at least some role and VP shunt  recommended.  If follow-up labs results are acceptable and no new CV symptoms then I would anticipate that she could proceed as planned. She will be further evaluated by her assigned anesthesiologist on the day of surgery.  [name] Short Stay Crane/Anesthesiology Phone [phone] 12/20/2013 11:21 AM

## 2013-12-20 NOTE — Anesthesia Procedure Notes (Signed)
Procedure Name: Intubation Date/Time: 12/20/2013 3:40 PM Performed by: Ned Grace Pre-anesthesia Checklist: Patient identified, Patient being monitored, Emergency Drugs available, Timeout performed and Suction available Patient Re-evaluated:Patient Re-evaluated prior to inductionOxygen Delivery Method: Circle system utilized Preoxygenation: Pre-oxygenation with 100% oxygen Intubation Type: IV induction Ventilation: Mask ventilation without difficulty and Oral airway inserted - appropriate to patient size Laryngoscope Size: Mac and 3 Grade View: Grade I Tube type: Oral Tube size: 7.0 mm Number of attempts: 1 Airway Equipment and Method: Stylet Placement Confirmation: ETT inserted through vocal cords under direct vision,  breath sounds checked- equal and bilateral and positive ETCO2 Secured at: 23 cm Tube secured with: Tape Dental Injury: Teeth and Oropharynx as per pre-operative assessment

## 2013-12-20 NOTE — OR Nursing (Signed)
Shunt set at 100.

## 2013-12-20 NOTE — Transfer of Care (Signed)
Immediate Anesthesia Transfer of Care Note  Patient: [name]  Procedure(s) Performed: Procedure(s) with comments: SHUNT INSERTION VENTRICULAR-PERITONEAL (Right) - VP shunt insertion  Patient Location: PACU  Anesthesia Type:General  Level of Consciousness: awake, oriented, patient cooperative and lethargic  Airway & Oxygen Therapy: Patient Spontanous Breathing and Patient connected to nasal cannula oxygen  Post-op Assessment: Report given to PACU RN, Post -op Vital signs reviewed and stable and Patient moving all extremities X 4  Post vital signs: Reviewed and stable  Complications: No apparent anesthesia complications

## 2013-12-21 MED ORDER — PANTOPRAZOLE SODIUM 40 MG PO TBEC
40.0000 mg | DELAYED_RELEASE_TABLET | Freq: Every day | ORAL | Status: DC
  Administered 2013-12-21: 40 mg via ORAL
  Filled 2013-12-21: qty 1

## 2013-12-21 NOTE — Progress Notes (Signed)
Patient arrived on floor from [facility] via wheelchair.  Patient oriented to 4N room and policies regarding bed alarm.  Patient made comfortable and will continue to monitor.  Kerri Perches

## 2013-12-21 NOTE — Progress Notes (Signed)
Patient ID: [name], female   DOB: 1935/06/21, 79 y.o.   MRN: [id number] Subjective:  The patient is alert and pleasant. She looks well.  Objective: Vital signs in last 24 hours: Temp:  [97.1 F (36.2 C)-99.3 F (37.4 C)] 99.3 F (37.4 C) (06/27 0317) Pulse Rate:  [52-105] 71 (06/27 0700) Resp:  [10-20] 14 (06/27 0700) BP: (122-196)/(50-97) 147/57 mmHg (06/27 0700) SpO2:  [95 %-100 %] 95 % (06/27 0700) Weight:  [55.339 kg (122 lb)] 55.339 kg (122 lb) (06/26 1315)  Intake/Output from previous day: 06/26 0701 - 06/27 0700 In: 2320 [P.O.:360; I.V.:1960] Out: 810 [Urine:810] Intake/Output this shift:    Physical exam the patient is alert and oriented. She is moving all 4 extremities well.  Lab Results:  Recent Labs  12/19/13 1547 12/20/13 1315  WBC 6.1  --   HGB 12.8 13.6  HCT 41.1 40.0  PLT 212  --    BMET  Recent Labs  12/19/13 1547 12/20/13 1315  NA 144 143  K 5.8* 4.1  CL 108  --   CO2 25  --   GLUCOSE 97 90  BUN 25*  --   CREATININE 1.28*  --   CALCIUM 10.5  --     Studies/Results: No results found.  Assessment/Plan: Postop day #1: The patient is doing well. I will transfer her to the floor.  LOS: 1 day     JENKINS,Tavio Biegel D 12/21/2013, 7:22 AM

## 2013-12-22 MED ORDER — HYDROCODONE-ACETAMINOPHEN 5-325 MG PO TABS: 1.0000 | ORAL_TABLET | ORAL | Status: DC | PRN

## 2013-12-22 NOTE — Progress Notes (Signed)
Utilization Review Completed.Dowell, Deborah T6/28/2015  

## 2013-12-22 NOTE — Progress Notes (Signed)
D/C orders received. Pt and daughter educated on d/c instructions and given d/c packet. Verbalized understanding. Prescription given to pt. IV removed. Pt taken downstairs by staff via wheelchair.

## 2013-12-22 NOTE — Discharge Summary (Signed)
Physician Discharge Summary  Patient ID: [name] MRN: [id number] DOB/AGE: 10/14/1934 79 y.o.  Admit date: 12/20/2013 Discharge date: 12/22/2013  Admission Diagnoses: Hydrocephalus  Discharge Diagnoses: The same Active Problems:   Hydrocephalus   Discharged Condition: good  Hospital Course: Dr. Cyndy Freeze placed a right ventriculoperitoneal shunt in a patient on 12/20/2013.  The patient's postoperative course was unremarkable. On postoperative day #2 she requested discharge to home. She was given oral and written discharge instructions. All her questions were answered.  Consults: None Significant Diagnostic Studies: None Treatments: Placement of right frontal ventriculoperitoneal shunt. Discharge Exam: Blood pressure 177/73, pulse 81, temperature 98.3 F (36.8 C), temperature source Oral, resp. rate 20, height 5\' 1"  (1.549 m), weight 58.832 kg (129 lb 11.2 oz), SpO2 95.00%. Patient is alert and oriented. Her strength is normal. Her wounds are healing well.  Disposition: Home  Discharge Instructions   Call MD for:  difficulty breathing, headache or visual disturbances    Complete by:  As directed      Call MD for:  extreme fatigue    Complete by:  As directed      Call MD for:  hives    Complete by:  As directed      Call MD for:  persistant dizziness or light-headedness    Complete by:  As directed      Call MD for:  persistant nausea and vomiting    Complete by:  As directed      Call MD for:  redness, tenderness, or signs of infection (pain, swelling, redness, odor or green/yellow discharge around incision site)    Complete by:  As directed      Call MD for:  severe uncontrolled pain    Complete by:  As directed      Call MD for:  temperature >100.4    Complete by:  As directed      Diet - low sodium heart healthy    Complete by:  As directed      Discharge instructions    Complete by:  As directed   Call [phone] for a followup appointment. Take a stool  softener while you are using pain medications.     Driving Restrictions    Complete by:  As directed   Do not drive for 2 weeks.     Increase activity slowly    Complete by:  As directed      Lifting restrictions    Complete by:  As directed   Do not lift more than 5 pounds. No excessive bending or twisting.     May shower / Bathe    Complete by:  As directed   He may shower after the pain she is removed 3 days after surgery. Leave the incision alone.     Remove dressing in 24 hours    Complete by:  As directed             Medication List         aspirin 81 MG tablet  Take 81 mg by mouth daily.     calcium citrate 950 MG tablet  Commonly known as:  CALCITRATE - dosed in mg elemental calcium  Take 400 mg of elemental calcium by mouth daily.     cholecalciferol 1000 UNITS tablet  Commonly known as:  VITAMIN D  Take 2,000 Units by mouth 2 (two) times daily.     ezetimibe 10 MG tablet  Commonly known as:  ZETIA  Take 10 mg  by mouth daily.     HYDROcodone-acetaminophen 5-325 MG per tablet  Commonly known as:  NORCO/VICODIN  Take 1 tablet by mouth every 4 (four) hours as needed for moderate pain.     loratadine 10 MG tablet  Commonly known as:  CLARITIN  Take 10 mg by mouth daily.     losartan 100 MG tablet  Commonly known as:  COZAAR  Take 100 mg by mouth daily.     metoprolol succinate 50 MG 24 hr tablet  Commonly known as:  TOPROL-XL  Take 50 mg by mouth daily. Take with or immediately following a meal.     multivitamin with minerals Tabs tablet  Take 1 tablet by mouth daily.     pravastatin 40 MG tablet  Commonly known as:  PRAVACHOL  Take 1 tablet (40 mg total) by mouth daily.     risperiDONE 0.25 MG tablet  Commonly known as:  RISPERDAL  Take 1 tablet (0.25 mg total) by mouth 2 (two) times daily.     SYSTANE OP  Place 1 drop into both eyes 2 (two) times daily as needed (dry eyes).     traZODone 50 MG tablet  Commonly known as:  DESYREL  Take 1  tablet (50 mg total) by mouth at bedtime.         SignedOphelia Charter 12/22/2013, 9:17 AM

## 2013-12-23 ENCOUNTER — Other Ambulatory Visit: Payer: Self-pay | Admitting: Internal Medicine

## 2013-12-24 ENCOUNTER — Encounter (HOSPITAL_COMMUNITY): Payer: Self-pay | Admitting: Neurosurgery

## 2013-12-30 ENCOUNTER — Encounter: Payer: Self-pay | Admitting: Family Medicine

## 2013-12-30 ENCOUNTER — Ambulatory Visit (INDEPENDENT_AMBULATORY_CARE_PROVIDER_SITE_OTHER): Payer: Medicare Other | Admitting: Family Medicine

## 2013-12-30 ENCOUNTER — Other Ambulatory Visit: Payer: Self-pay | Admitting: Internal Medicine

## 2013-12-30 VITALS — BP 202/94 | HR 85 | Temp 98.2°F | Resp 16 | Ht 61.0 in | Wt 117.2 lb

## 2013-12-30 DIAGNOSIS — R404 Transient alteration of awareness: Secondary | ICD-10-CM

## 2013-12-30 DIAGNOSIS — R112 Nausea with vomiting, unspecified: Secondary | ICD-10-CM

## 2013-12-30 DIAGNOSIS — R410 Disorientation, unspecified: Secondary | ICD-10-CM

## 2013-12-30 DIAGNOSIS — D496 Neoplasm of unspecified behavior of brain: Secondary | ICD-10-CM

## 2013-12-30 DIAGNOSIS — N289 Disorder of kidney and ureter, unspecified: Secondary | ICD-10-CM

## 2013-12-30 DIAGNOSIS — I10 Essential (primary) hypertension: Secondary | ICD-10-CM

## 2013-12-30 DIAGNOSIS — G919 Hydrocephalus, unspecified: Secondary | ICD-10-CM

## 2013-12-30 DIAGNOSIS — G911 Obstructive hydrocephalus: Secondary | ICD-10-CM

## 2013-12-30 LAB — CBC WITH DIFFERENTIAL/PLATELET
Basophils Absolute: 0 10*3/uL (ref 0.0–0.1)
Basophils Relative: 0 % (ref 0–1)
EOS PCT: 0 % (ref 0–5)
Eosinophils Absolute: 0 10*3/uL (ref 0.0–0.7)
HEMATOCRIT: 37.9 % (ref 36.0–46.0)
HEMOGLOBIN: 12.7 g/dL (ref 12.0–15.0)
LYMPHS ABS: 0.8 10*3/uL (ref 0.7–4.0)
LYMPHS PCT: 11 % — AB (ref 12–46)
MCH: 26.5 pg (ref 26.0–34.0)
MCHC: 33.5 g/dL (ref 30.0–36.0)
MCV: 79 fL (ref 78.0–100.0)
MONO ABS: 0.4 10*3/uL (ref 0.1–1.0)
MONOS PCT: 5 % (ref 3–12)
Neutro Abs: 6.2 10*3/uL (ref 1.7–7.7)
Neutrophils Relative %: 84 % — ABNORMAL HIGH (ref 43–77)
Platelets: 289 10*3/uL (ref 150–400)
RBC: 4.8 MIL/uL (ref 3.87–5.11)
RDW: 17.8 % — ABNORMAL HIGH (ref 11.5–15.5)
WBC: 7.4 10*3/uL (ref 4.0–10.5)

## 2013-12-30 LAB — POCT UA - MICROSCOPIC ONLY
CASTS, UR, LPF, POC: NEGATIVE
Crystals, Ur, HPF, POC: NEGATIVE
MUCUS UA: POSITIVE
Yeast, UA: NEGATIVE

## 2013-12-30 LAB — POCT URINALYSIS DIPSTICK
Glucose, UA: NEGATIVE
KETONES UA: 15
Leukocytes, UA: NEGATIVE
Nitrite, UA: NEGATIVE
PH UA: 5
Protein, UA: 300
Spec Grav, UA: >=1.03
Urobilinogen, UA: 0.2

## 2013-12-30 MED ORDER — TRAZODONE HCL 50 MG PO TABS: 50.0000 mg | ORAL_TABLET | Freq: Every day | ORAL | Status: DC

## 2013-12-30 MED ORDER — RISPERIDONE 0.25 MG PO TABS: 0.2500 mg | ORAL_TABLET | Freq: Two times a day (BID) | ORAL | Status: DC

## 2013-12-30 MED ORDER — AMLODIPINE BESYLATE 5 MG PO TABS: 5.0000 mg | ORAL_TABLET | Freq: Every day | ORAL | Status: DC

## 2013-12-30 NOTE — Progress Notes (Signed)
Subjective:  This chart was scribed for  Reginia Forts, MD  by Stacy Gardner, [facility] Scribe. The patient was seen in room and the patient's care was started at 2:00 PM.  Patient ID: [name], female    DOB: October 16, 1934, 79 y.o.   MRN: [id number]  12/30/2013  Follow-up, Dizziness, Nausea, Altered Mental Status, Weakness and Medication Refill   HPI HPI Comments: [name] is a 79 y.o. female who arrives to the [facility] for a [place] [facility] follow up for a placement of right ventricular peritoneal shunt placed on  6/26 by Dr. Rosezetta Schlatter for hydrocephaly. Pt mentioned having nausea and vomited twice today. Per pt's daughter she seems more confused and she still has visual hallucinations of seeing people. Pt becomes agitated when her daughter is unable to see her hallucinations. Also, she had more trouble with a simple coffee pot and mistaking a window button for her car seat buckle.  Pt is tearful.  She did not take any of the Losartan as advised after last visit. Pt was told to expect improvements in two weeks by the NS physician. Pt has alternating days of being active and staying in bed. Per pt's daughter,  was walking barefooted in her nightgown down the street of her neighborhood and then tried to get into someone's car. Pt is unable to recall the full story. Pt did not eat breakfast or have anything to drink today. She gained weight while in the hospital.  Pt's daughter found a small pair of scissors and pt stated that she was removing her sutures. Pt's daughter was not able to notice any changes to her sutures. Pt complained of a headache today for the first time since surgical procedure. Denies present nausea and dizziness.  She has an intermittent sharp pain to her lower right abdomen. The pain sometimes occurs while lying down. Pt also has intermittent left leg pain. She does not have car sickness. Denies sore throat.  Pt is not eating well and not drinking ample amounts of water. She is constipated. Pt is drinking water with her medication. She mentions that that she goes to the bathroom 3-4 times during the night. She had dysuria intermittently. Pt's daughter leaves a pain pill out for her mother when she is not at home. Pt is sleeping most of the time when her daughter comes to visit her.  Pt's daughter did not follow up with Dr. Cyndy Freeze yet.  She has home health nurses scheduled to come to pt's home to take her BP.   Review of Systems  Constitutional: Positive for unexpected weight change. Negative for appetite change.  HENT: Negative for sore throat.   Eyes: Negative for photophobia and visual disturbance.  Respiratory: Negative for cough and shortness of breath.   Cardiovascular: Negative for chest pain, palpitations and leg swelling.  Gastrointestinal: Positive for nausea, vomiting, abdominal pain and constipation.  Genitourinary: Negative for dysuria.  Musculoskeletal: Negative for arthralgias and myalgias.  Skin: Positive for wound.  Neurological: Positive for headaches. Negative for dizziness, tremors, seizures, syncope, facial asymmetry, speech difficulty, weakness, light-headedness and numbness.  Psychiatric/Behavioral: Positive for hallucinations, behavioral problems, confusion, dysphoric mood and agitation. Negative for suicidal ideas, sleep disturbance and self-injury. The patient is nervous/anxious.   All other systems reviewed and are negative.   Past Medical History  Diagnosis Date  . Hyperlipidemia   . Essential hypertension, benign   . CAD (coronary artery disease)   .  Osteoporosis   . Hypercalcemia   . Vitamin D deficiency   . Ovarian tumor   . Breast tumor   . MI (myocardial infarction) July 1995    Treated with LAD PCI- Dr Adora Fridge  . Cataract   . Complication of anesthesia   . PONV (postoperative nausea and vomiting)   . Lung tumor     recently found approx 25 days  Simone Curia, MD   Past Surgical History  Procedure Laterality Date  . Angioplasty    . Hip pinning    . Breast lumpectomy    . Abdominal hysterectomy    . Cholecystectomy    . Fracture surgery  Nov 2003    L distal radius fx- Dr. Frederik Pear  . Eye surgery      Cataract removal  . Cardiac catheterization      1990's  . Ventriculoperitoneal shunt Right 12/20/2013    Procedure: SHUNT INSERTION VENTRICULAR-PERITONEAL;  Surgeon: Winfield Cunas, MD;  Location: [facility];  Service: Neurosurgery;  Laterality: Right;  VP shunt insertion    Allergies  Allergen Reactions  . Sulfa Antibiotics Swelling  . Codeine Nausea And Vomiting  . Other Nausea And Vomiting    Some type of pill given for allergic reaction    Current Outpatient Prescriptions  Medication Sig Dispense Refill  . aspirin 81 MG tablet Take 81 mg by mouth daily.      . calcium citrate (CALCITRATE - DOSED IN MG ELEMENTAL CALCIUM) 950 MG tablet Take 400 mg of elemental calcium by mouth daily.       . cholecalciferol (VITAMIN D) 1000 UNITS tablet Take 2,000 Units by mouth 2 (two) times daily.       Marland Kitchen ezetimibe (ZETIA) 10 MG tablet Take 10 mg by mouth daily.      Marland Kitchen HYDROcodone-acetaminophen (NORCO/VICODIN) 5-325 MG per tablet Take 1 tablet by mouth every 4 (four) hours as needed for moderate pain.  50 tablet  0  . loratadine (CLARITIN) 10 MG tablet Take 10 mg by mouth daily.      . metoprolol succinate (TOPROL-XL) 50 MG 24 hr tablet Take 50 mg by mouth daily. Take with or immediately following a meal.      . Multiple Vitamin (MULTIVITAMIN WITH MINERALS) TABS tablet Take 1 tablet by mouth daily.  30 tablet  0  . Polyethyl Glycol-Propyl Glycol (SYSTANE OP) Place 1 drop into both eyes 2 (two) times daily as needed (dry eyes).      . pravastatin (PRAVACHOL) 40 MG tablet Take 1 tablet (40 mg total) by mouth daily.  30 tablet  11  . risperiDONE (RISPERDAL) 0.25 MG tablet Take 1 tablet (0.25 mg total) by mouth 2 (two) times  daily.  60 tablet  5  . traZODone (DESYREL) 50 MG tablet Take 1 tablet (50 mg total) by mouth at bedtime.  30 tablet  5  . amLODipine (NORVASC) 5 MG tablet Take 1 tablet (5 mg total) by mouth daily.  30 tablet  5  . cephALEXin (KEFLEX) 500 MG capsule Take 1 capsule (500 mg total) by mouth 3 (three) times daily.  21 capsule  0  . losartan (COZAAR) 100 MG tablet Take 100 mg by mouth daily.       No current facility-administered medications for this visit.       Objective:    BP 202/94  Pulse 85  Temp(Src) 98.2 F (36.8 C) (Oral)  Resp 16  Ht 5' 1" (1.549 m)  Wt  117 lb 3.2 oz (53.162 kg)  BMI 22.16 kg/m2  SpO2 95%  Physical Exam  Nursing note and vitals reviewed. Constitutional: She is oriented to person, place, and time. She appears well-developed and well-nourished. No distress.  HENT:  Head: Normocephalic and atraumatic.  Right Ear: External ear normal.  Left Ear: External ear normal.  Nose: Nose normal.  Mouth/Throat: Oropharynx is clear and moist.  Mm slightly dry.  Eyes: Conjunctivae and EOM are normal. Pupils are equal, round, and reactive to light.  Neck: Normal range of motion. Neck supple. No tracheal deviation present. No thyromegaly present.  Cardiovascular: Normal rate and regular rhythm.  Exam reveals no gallop and no friction rub.   No murmur heard. Pulmonary/Chest: Effort normal and breath sounds normal. No respiratory distress. She has no wheezes. She has no rales.  Abdominal: Soft. Bowel sounds are normal. She exhibits no distension and no mass. There is no tenderness. There is no rebound and no guarding.  Horizontal incision on RUQ  Musculoskeletal: Normal range of motion.  Lymphadenopathy:    She has no cervical adenopathy.  Neurological: She is alert and oriented to person, place, and time. No cranial nerve deficit. She exhibits normal muscle tone. Coordination normal.  Skin: Skin is warm and dry. No rash noted. She is not diaphoretic.  10 staples to the  front of her head and one staple to the back of pt's head; incisions well approximated without erythema, drainage, induration, swelling.  Psychiatric: She has a normal mood and affect. Her behavior is normal.   Results for orders placed in visit on 12/30/13  URINE CULTURE      Result Value Ref Range   Culture ENTEROCOCCUS SPECIES     Colony Count 40,000 COLONIES/ML     Organism ID, Bacteria ENTEROCOCCUS SPECIES    CBC WITH DIFFERENTIAL      Result Value Ref Range   WBC 7.4  4.0 - 10.5 K/uL   RBC 4.80  3.87 - 5.11 MIL/uL   Hemoglobin 12.7  12.0 - 15.0 g/dL   HCT 37.9  36.0 - 46.0 %   MCV 79.0  78.0 - 100.0 fL   MCH 26.5  26.0 - 34.0 pg   MCHC 33.5  30.0 - 36.0 g/dL   RDW 17.8 (*) 11.5 - 15.5 %   Platelets 289  150 - 400 K/uL   Neutrophils Relative % 84 (*) 43 - 77 %   Neutro Abs 6.2  1.7 - 7.7 K/uL   Lymphocytes Relative 11 (*) 12 - 46 %   Lymphs Abs 0.8  0.7 - 4.0 K/uL   Monocytes Relative 5  3 - 12 %   Monocytes Absolute 0.4  0.1 - 1.0 K/uL   Eosinophils Relative 0  0 - 5 %   Eosinophils Absolute 0.0  0.0 - 0.7 K/uL   Basophils Relative 0  0 - 1 %   Basophils Absolute 0.0  0.0 - 0.1 K/uL   Smear Review Criteria for review not met    COMPREHENSIVE METABOLIC PANEL      Result Value Ref Range   Sodium 141  135 - 145 mEq/L   Potassium 4.5  3.5 - 5.3 mEq/L   Chloride 103  96 - 112 mEq/L   CO2 25  19 - 32 mEq/L   Glucose, Bld 110 (*) 70 - 99 mg/dL   BUN 19  6 - 23 mg/dL   Creat 1.07  0.50 - 1.10 mg/dL   Total Bilirubin 0.5  0.2 -  1.2 mg/dL   Alkaline Phosphatase 99  39 - 117 U/L   AST 22  0 - 37 U/L   ALT 18  0 - 35 U/L   Total Protein 7.5  6.0 - 8.3 g/dL   Albumin 4.3  3.5 - 5.2 g/dL   Calcium 11.3 (*) 8.4 - 10.5 mg/dL  POCT URINALYSIS DIPSTICK      Result Value Ref Range   Color, UA yellow     Clarity, UA clear     Glucose, UA neg     Bilirubin, UA small     Ketones, UA 15     Spec Grav, UA >=1.030     Blood, UA trace     pH, UA 5.0     Protein, UA 300      Urobilinogen, UA 0.2     Nitrite, UA neg     Leukocytes, UA Negative    POCT UA - MICROSCOPIC ONLY      Result Value Ref Range   WBC, Ur, HPF, POC 5-8     RBC, urine, microscopic tntc     Bacteria, U Microscopic 2+     Mucus, UA pos     Epithelial cells, urine per micros 0-1     Crystals, Ur, HPF, POC neg     Casts, Ur, LPF, POC neg     Yeast, UA neg         Assessment & Plan:  Non-intractable vomiting with nausea, vomiting of unspecified type - Plan: CBC with Differential, Comprehensive metabolic panel, Urine culture, POCT urinalysis dipstick, POCT UA - Microscopic Only  Posterior fossa tumor  Hydrocephalus  HTN (hypertension), benign  Delirium  Acquired obstructive hydrocephalus  Renal insufficiency  1. Nausea and vomiting: New.  Onset today; obtain labs, urine, urine culture.  Daughter to contact NS regarding headache, n/v.  Neurologically intact in office and afebrile. 2.  Acquired obstructive hydrocephalus: Improved; s/p shunt placement; follow-up with NS in upcoming week.  Daughter to contact NS today regarding onset of n/v, headache. 3.  HTN: uncontrolled; add Amlodipine 18m daily; continue to HOLD Losartan due to acute renal insufficiency from decreased fluid intake. Continue Metoprolol at current dose.  Have HH check BP daily for upcoming week. 4.  Renal insufficiency: New; improved during admission with iv hydration; repeat labs today. 5.  Delirium: improved temporarily after shunt placement with recurrence.  Continue Risperdal and Trazodone.  Continue to follow closely; daughter administering all medications; advised to stop hydrocodone at this time and switch to Tylenol for pain.  Hydrocodone could be contributing to delirium.   Meds ordered this encounter  Medications  . amLODipine (NORVASC) 5 MG tablet    Sig: Take 1 tablet (5 mg total) by mouth daily.    Dispense:  30 tablet    Refill:  5  . DISCONTD: risperiDONE (RISPERDAL) 0.25 MG tablet    Sig: Take 1  tablet (0.25 mg total) by mouth 2 (two) times daily.    Dispense:  60 tablet    Refill:  5  . DISCONTD: traZODone (DESYREL) 50 MG tablet    Sig: Take 1 tablet (50 mg total) by mouth at bedtime.    Dispense:  30 tablet    Refill:  5  . risperiDONE (RISPERDAL) 0.25 MG tablet    Sig: Take 1 tablet (0.25 mg total) by mouth 2 (two) times daily.    Dispense:  60 tablet    Refill:  5  . traZODone (DESYREL) 50 MG tablet  Sig: Take 1 tablet (50 mg total) by mouth at bedtime.    Dispense:  30 tablet    Refill:  5  . cephALEXin (KEFLEX) 500 MG capsule    Sig: Take 1 capsule (500 mg total) by mouth 3 (three) times daily.    Dispense:  21 capsule    Refill:  0    Return in about 2 weeks (around 01/13/2014) for recheck.  I personally performed the services described in this documentation, which was scribed in my presence.  The recorded information has been reviewed and is accurate.  Reginia Forts, M.D.  [facility] [address] [place], [place]  [address] [phone] phone [phone] fax

## 2013-12-30 NOTE — Patient Instructions (Signed)
1. Recommend Tylenol for pain (instead of hydrocodone). 2.  Start Amlodipine 5mg  one tablet daily for blood pressure.  Continue to HOLD Losartan. 3.  Call Dr. Hewitt Shorts office in morning regarding nausea. 4.  Follow-up in two weeks.

## 2013-12-31 ENCOUNTER — Telehealth: Payer: Self-pay

## 2013-12-31 LAB — COMPREHENSIVE METABOLIC PANEL
ALT: 18 U/L (ref 0–35)
AST: 22 U/L (ref 0–37)
Albumin: 4.3 g/dL (ref 3.5–5.2)
Alkaline Phosphatase: 99 U/L (ref 39–117)
BUN: 19 mg/dL (ref 6–23)
CALCIUM: 11.3 mg/dL — AB (ref 8.4–10.5)
CHLORIDE: 103 meq/L (ref 96–112)
CO2: 25 meq/L (ref 19–32)
CREATININE: 1.07 mg/dL (ref 0.50–1.10)
GLUCOSE: 110 mg/dL — AB (ref 70–99)
Potassium: 4.5 mEq/L (ref 3.5–5.3)
Sodium: 141 mEq/L (ref 135–145)
Total Bilirubin: 0.5 mg/dL (ref 0.2–1.2)
Total Protein: 7.5 g/dL (ref 6.0–8.3)

## 2013-12-31 NOTE — Telephone Encounter (Signed)
Called Kristi to notify orders have been extended 2 weeks. She would like to find out about getting a bp cuff (Very easy to use) for at home use. Is this possible to order for pt?

## 2013-12-31 NOTE — Telephone Encounter (Signed)
I am not sure; OK to give verbal order to pharmacy or medical supply store for blood pressure cuff dx: HTN 401.1.

## 2013-12-31 NOTE — Telephone Encounter (Addendum)
Kristi wants Dr. Tamala Julian to know she called Dr. Janet Berlin about her mom Christena Deem. This information is being relayed due to issues with Dr. Roseanne Reno office in the past. She also needs Dr. Tamala Julian to fax a new order for the home health care nurse her time has expired

## 2013-12-31 NOTE — Telephone Encounter (Signed)
Please call [facility] to extend nursing visits. Recommend daily nursing visit to check BP and all other vitals daily for next two weeks.

## 2013-12-31 NOTE — Telephone Encounter (Signed)
LM on Beth [employer]) private voicemail to extend current orders. Asked for a call back to confirm.

## 2013-12-31 NOTE — Telephone Encounter (Signed)
Spoke to [name]- she reports that [name] has not been doing well since her sx. She has called the Neurologist about the nausea. Her confusion is more often. She has been spending more time sleeping, less active. Daughter states that [name] has not been drinking fluids. She does not eat well right now. She is not taking the prescription pain medication. She is only taking Tylenol as needed.  [name] is concerned that she might have an UTI again.  [facility]

## 2013-12-31 NOTE — Progress Notes (Signed)
LMVM to CB to schedule an appt with Dr. Tamala Julian.

## 2013-12-31 NOTE — Telephone Encounter (Signed)
LM for [name] [phone] Need directions to send to [facility]

## 2014-01-01 NOTE — Telephone Encounter (Signed)
Spoke to pharmacy- said that we would need to call into [facility].  Called [facility] to order BP Cuff- they stated that insurance does not cover this device.    Received paper message- Eustaquio Maize from [facility] called to notify us that they are not able to do daily BP's. -- LM on Beth's VM [phone]

## 2014-01-01 NOTE — Telephone Encounter (Signed)
Lm on VM again.

## 2014-01-01 NOTE — Telephone Encounter (Signed)
Call Beth from [facility] --- how often can they do blood pressures and nursing visits?

## 2014-01-02 ENCOUNTER — Telehealth: Payer: Self-pay | Admitting: Family Medicine

## 2014-01-02 LAB — URINE CULTURE: Colony Count: 40000

## 2014-01-02 MED ORDER — CEPHALEXIN 500 MG PO CAPS: 500.0000 mg | ORAL_CAPSULE | Freq: Three times a day (TID) | ORAL | Status: DC

## 2014-01-02 NOTE — Telephone Encounter (Signed)
appt made for two weeks. Lab results given to pts daughter [name]. Daughter stated that she sees patient once daily and Keflex TID will be challenging to administer. Is there something else that she can take less frequently?

## 2014-01-02 NOTE — Telephone Encounter (Signed)
Message copied by Chinita Pester on Thu Jan 02, 2014  2:15 PM ------      Message from: Wardell Honour      Created: Mon Dec 30, 2013  5:35 PM       Please schedule OV with me in 2 weeks. ------

## 2014-01-02 NOTE — Telephone Encounter (Signed)
Spoke to [place] at [facility]. They are not able to justify going to the pt home for BP checks only. Pt has been declared stable and safe. She is faxing the discharge summary to Dr. Tamala Julian. I asked if there was an alternative. She advised me of Telehealth Services. This is were the pt may take their own BP and report it to a nurse line that is monitored by a nurse. They would send the BP report to the Dr. Weekly. We would need to send an order for them to set this up. Please advise.

## 2014-01-13 ENCOUNTER — Encounter: Payer: Self-pay | Admitting: Family Medicine

## 2014-01-13 ENCOUNTER — Ambulatory Visit (INDEPENDENT_AMBULATORY_CARE_PROVIDER_SITE_OTHER): Payer: Medicare Other | Admitting: Family Medicine

## 2014-01-13 VITALS — BP 180/82 | HR 69 | Temp 98.2°F | Resp 16 | Ht 61.0 in | Wt 116.8 lb

## 2014-01-13 DIAGNOSIS — G919 Hydrocephalus, unspecified: Secondary | ICD-10-CM

## 2014-01-13 DIAGNOSIS — R911 Solitary pulmonary nodule: Secondary | ICD-10-CM

## 2014-01-13 DIAGNOSIS — R404 Transient alteration of awareness: Secondary | ICD-10-CM

## 2014-01-13 DIAGNOSIS — G911 Obstructive hydrocephalus: Secondary | ICD-10-CM

## 2014-01-13 DIAGNOSIS — R410 Disorientation, unspecified: Secondary | ICD-10-CM

## 2014-01-13 DIAGNOSIS — R440 Auditory hallucinations: Secondary | ICD-10-CM

## 2014-01-13 DIAGNOSIS — I10 Essential (primary) hypertension: Secondary | ICD-10-CM

## 2014-01-13 DIAGNOSIS — R112 Nausea with vomiting, unspecified: Secondary | ICD-10-CM

## 2014-01-13 DIAGNOSIS — N390 Urinary tract infection, site not specified: Secondary | ICD-10-CM

## 2014-01-13 DIAGNOSIS — R443 Hallucinations, unspecified: Secondary | ICD-10-CM

## 2014-01-13 MED ORDER — AMLODIPINE BESYLATE 10 MG PO TABS: 10.0000 mg | ORAL_TABLET | Freq: Every day | ORAL | Status: DC

## 2014-01-13 NOTE — Progress Notes (Signed)
Subjective:    Patient ID: [name], female    DOB: 06/05/1935, 79 y.o.   MRN: [id number]  01/13/2014  Follow-up, Hypertension and Hallucinations   HPI This 79 y.o. female presents with daughter for two week follow-up:  1. HTN:  Blood pressure at home 840 systolic. Patient reports good compliance with medication, good tolerance to medication, and good symptom control.  Patient complained today only of headache since last visit. Tolerating Amlodipine without side effects.  Denies SOB/leg swelling. +some dizziness with changing positions.  2. Delirium: much improved in past four days; 50% improvement; got self dressed for appointment today; patient got ready self.  Even put on mascara today.  Cooked pork chops this past week.  Giving grocery list to daughter on the way over here.  Has pain with bending over or with changing positions in RLQ.  3.  N/V:  No vomiting in past four days.  No recurrent vomiting.  Vomited x 1 since last visit; occurred after eating a large meal.  4.  Hydrocephaly: s/p VP shunt placement 3-4 weeks ago on 12/20/13; almost four weeks since procedure.  No set follow-up; s/p staple removal.  PRN visit only if delirium continues.  5. UTI:  S/p Keflex therapy; has one more dose to take today.  6. Lung nodule: will need to schedule PET scan in upcoming weeks to evaluate spiculated nodule found on CT angio.   Review of Systems  Constitutional: Negative for fever, chills, diaphoresis and fatigue.  Respiratory: Negative for shortness of breath.   Cardiovascular: Negative for chest pain, palpitations and leg swelling.  Gastrointestinal: Positive for vomiting. Negative for nausea and abdominal pain.  Genitourinary: Negative for dysuria, urgency, frequency and flank pain.  Skin: Negative for rash and wound.  Neurological: Positive for dizziness, light-headedness and headaches. Negative for tremors, seizures, syncope, speech difficulty, weakness and numbness.    Psychiatric/Behavioral: Positive for confusion. Negative for hallucinations, sleep disturbance and dysphoric mood. The patient is not nervous/anxious.     Past Medical History  Diagnosis Date  . Hyperlipidemia   . Essential hypertension, benign   . CAD (coronary artery disease)   . Osteoporosis   . Hypercalcemia   . Vitamin D deficiency   . Ovarian tumor   . Breast tumor   . MI (myocardial infarction) July 1995    Treated with LAD PCI- Dr Adora Fridge  . Cataract   . Complication of anesthesia   . PONV (postoperative nausea and vomiting)   . Lung tumor     recently found approx 25 days Simone Curia, MD   Past Surgical History  Procedure Laterality Date  . Angioplasty    . Hip pinning    . Breast lumpectomy    . Abdominal hysterectomy    . Cholecystectomy    . Fracture surgery  Nov 2003    L distal radius fx- Dr. Frederik Pear  . Eye surgery      Cataract removal  . Cardiac catheterization      1990's  . Ventriculoperitoneal shunt Right 12/20/2013    Procedure: SHUNT INSERTION VENTRICULAR-PERITONEAL;  Surgeon: Winfield Cunas, MD;  Location: [facility];  Service: Neurosurgery;  Laterality: Right;  VP shunt insertion   Allergies  Allergen Reactions  . Sulfa Antibiotics Swelling  . Codeine Nausea And Vomiting  . Other Nausea And Vomiting    Some type of pill given for allergic reaction    Current Outpatient Prescriptions  Medication Sig Dispense Refill  . amLODipine (  NORVASC) 10 MG tablet Take 1 tablet (10 mg total) by mouth daily.  30 tablet  5  . aspirin 81 MG tablet Take 81 mg by mouth daily.      . calcium citrate (CALCITRATE - DOSED IN MG ELEMENTAL CALCIUM) 950 MG tablet Take 400 mg of elemental calcium by mouth daily.       . cephALEXin (KEFLEX) 500 MG capsule Take 1 capsule (500 mg total) by mouth 3 (three) times daily.  21 capsule  0  . cholecalciferol (VITAMIN D) 1000 UNITS tablet Take 2,000 Units by mouth 2 (two) times daily.       Marland Kitchen ezetimibe (ZETIA) 10 MG  tablet Take 10 mg by mouth daily.      Marland Kitchen HYDROcodone-acetaminophen (NORCO/VICODIN) 5-325 MG per tablet Take 1 tablet by mouth every 4 (four) hours as needed for moderate pain.  50 tablet  0  . loratadine (CLARITIN) 10 MG tablet Take 10 mg by mouth daily.      . metoprolol succinate (TOPROL-XL) 50 MG 24 hr tablet Take 50 mg by mouth daily. Take with or immediately following a meal.      . Multiple Vitamin (MULTIVITAMIN WITH MINERALS) TABS tablet Take 1 tablet by mouth daily.  30 tablet  0  . Polyethyl Glycol-Propyl Glycol (SYSTANE OP) Place 1 drop into both eyes 2 (two) times daily as needed (dry eyes).      . pravastatin (PRAVACHOL) 40 MG tablet Take 1 tablet (40 mg total) by mouth daily.  30 tablet  11  . risperiDONE (RISPERDAL) 0.25 MG tablet Take 1 tablet (0.25 mg total) by mouth 2 (two) times daily.  60 tablet  5  . traZODone (DESYREL) 50 MG tablet Take 1 tablet (50 mg total) by mouth at bedtime.  30 tablet  5  . losartan (COZAAR) 100 MG tablet Take 100 mg by mouth daily.       No current facility-administered medications for this visit.   History   Social History  . Marital Status: Widowed    Spouse Name: N/A    Number of Children: 1  . Years of Education: N/A   Occupational History  . retired    Social History Main Topics  . Smoking status: Current Some Day Smoker -- 1.00 packs/day for 60 years    Types: Cigarettes  . Smokeless tobacco: Not on file  . Alcohol Use: No  . Drug Use: No  . Sexual Activity: No   Other Topics Concern  . Not on file   Social History Narrative   Marital status: widowed.      Children: one daughter [name] [phone])      Employment: retired; Master's degree.      Lives: alone.      Tobacco: daily smoking.       Objective:    BP 180/82  Pulse 69  Temp(Src) 98.2 F (36.8 C) (Oral)  Resp 16  Ht 5' 1" (1.549 m)  Wt 116 lb 12.8 oz (52.98 kg)  BMI 22.08 kg/m2  SpO2 97% Physical Exam  Constitutional: She is oriented to person, place, and  time. She appears well-developed and well-nourished. No distress.  HENT:  Head: Normocephalic and atraumatic.  Right Ear: External ear normal.  Left Ear: External ear normal.  Nose: Nose normal.  Mouth/Throat: Oropharynx is clear and moist.  Eyes: Conjunctivae and EOM are normal. Pupils are equal, round, and reactive to light.  Neck: Normal range of motion. Neck supple. Carotid bruit is not present. No  thyromegaly present.  Cardiovascular: Normal rate, regular rhythm, normal heart sounds and intact distal pulses.  Exam reveals no gallop and no friction rub.   No murmur heard. Pulmonary/Chest: Effort normal and breath sounds normal. She has no wheezes. She has no rales.  Abdominal: Soft. Bowel sounds are normal. She exhibits no distension and no mass. There is no tenderness. There is no rebound and no guarding.  Well healing incision horizontal 2 cm RUQ.  Musculoskeletal:       Right hip: Normal. She exhibits normal range of motion, normal strength, no tenderness and no bony tenderness.  Lymphadenopathy:    She has no cervical adenopathy.  Neurological: She is alert and oriented to person, place, and time. No cranial nerve deficit.  Skin: Skin is warm and dry. No rash noted. She is not diaphoretic. No erythema. No pallor.  Psychiatric: She has a normal mood and affect. Her behavior is normal.   Results for orders placed in visit on 12/30/13  URINE CULTURE      Result Value Ref Range   Culture ENTEROCOCCUS SPECIES     Colony Count 40,000 COLONIES/ML     Organism ID, Bacteria ENTEROCOCCUS SPECIES    CBC WITH DIFFERENTIAL      Result Value Ref Range   WBC 7.4  4.0 - 10.5 K/uL   RBC 4.80  3.87 - 5.11 MIL/uL   Hemoglobin 12.7  12.0 - 15.0 g/dL   HCT 37.9  36.0 - 46.0 %   MCV 79.0  78.0 - 100.0 fL   MCH 26.5  26.0 - 34.0 pg   MCHC 33.5  30.0 - 36.0 g/dL   RDW 17.8 (*) 11.5 - 15.5 %   Platelets 289  150 - 400 K/uL   Neutrophils Relative % 84 (*) 43 - 77 %   Neutro Abs 6.2  1.7 - 7.7  K/uL   Lymphocytes Relative 11 (*) 12 - 46 %   Lymphs Abs 0.8  0.7 - 4.0 K/uL   Monocytes Relative 5  3 - 12 %   Monocytes Absolute 0.4  0.1 - 1.0 K/uL   Eosinophils Relative 0  0 - 5 %   Eosinophils Absolute 0.0  0.0 - 0.7 K/uL   Basophils Relative 0  0 - 1 %   Basophils Absolute 0.0  0.0 - 0.1 K/uL   Smear Review Criteria for review not met    COMPREHENSIVE METABOLIC PANEL      Result Value Ref Range   Sodium 141  135 - 145 mEq/L   Potassium 4.5  3.5 - 5.3 mEq/L   Chloride 103  96 - 112 mEq/L   CO2 25  19 - 32 mEq/L   Glucose, Bld 110 (*) 70 - 99 mg/dL   BUN 19  6 - 23 mg/dL   Creat 1.07  0.50 - 1.10 mg/dL   Total Bilirubin 0.5  0.2 - 1.2 mg/dL   Alkaline Phosphatase 99  39 - 117 U/L   AST 22  0 - 37 U/L   ALT 18  0 - 35 U/L   Total Protein 7.5  6.0 - 8.3 g/dL   Albumin 4.3  3.5 - 5.2 g/dL   Calcium 11.3 (*) 8.4 - 10.5 mg/dL  POCT URINALYSIS DIPSTICK      Result Value Ref Range   Color, UA yellow     Clarity, UA clear     Glucose, UA neg     Bilirubin, UA small     Ketones, UA  15     Spec Grav, UA >=1.030     Blood, UA trace     pH, UA 5.0     Protein, UA 300     Urobilinogen, UA 0.2     Nitrite, UA neg     Leukocytes, UA Negative    POCT UA - MICROSCOPIC ONLY      Result Value Ref Range   WBC, Ur, HPF, POC 5-8     RBC, urine, microscopic tntc     Bacteria, U Microscopic 2+     Mucus, UA pos     Epithelial cells, urine per micros 0-1     Crystals, Ur, HPF, POC neg     Casts, Ur, LPF, POC neg     Yeast, UA neg         Assessment & Plan:   1. Essential hypertension, benign   2. Lung nodule   3. Hydrocephalus   4. Delirium   5. Auditory hallucinations   6. Acquired obstructive hydrocephalus   7. Urinary tract infection, site not specified   8. Non-intractable vomiting with nausea, vomiting of unspecified type    1. HTN: improved from last visit; increase Amlodipine to 71m daily.  Continue to HOLD Losartan for now with decreased po and fluid intake. 2.   Lung nodule: stable; schedule PET scan at next visit. 3.  Hydrocephalus: s/p VP shunt placement 3-4 weeks ago with mental improvement in past four days.   4.  Delirium: improvement in past four days; continue to monitor closely; RTC two weeks.  [facility]; no immediate needs needed at this time. 5.  Auditory hallucinations: slowly improving; continue Risperdal and Trazodone at current doses for now. 6.  UTI: s/p treatment with Keflex. 7. N/v: improving since last visit; one episode of vomiting in past two weeks.  Meds ordered this encounter  Medications  . amLODipine (NORVASC) 10 MG tablet    Sig: Take 1 tablet (10 mg total) by mouth daily.    Dispense:  30 tablet    Refill:  5    Return in about 2 weeks (around 01/27/2014) for recheck.    KReginia Forts M.D.  [facility]  [phone]

## 2014-01-20 NOTE — Telephone Encounter (Signed)
Please advise 

## 2014-01-20 NOTE — Telephone Encounter (Signed)
Daughter wants to let Dr. Tamala Julian know patient had profound behavior differences over the weekend.  She wants to know if she should have an earlier OV than the one scheduled?   She will be out of blood pressure medication in two days,  Dr. Tamala Julian was going to increase the dosage.   Needs a new prescription.  [place]

## 2014-01-21 ENCOUNTER — Other Ambulatory Visit: Payer: Self-pay | Admitting: Family Medicine

## 2014-01-21 MED ORDER — AMLODIPINE BESYLATE 10 MG PO TABS: 10.0000 mg | ORAL_TABLET | Freq: Every day | ORAL | Status: DC

## 2014-01-21 NOTE — Telephone Encounter (Signed)
daughter has been helping mom, hospital states until she stabalizes she should not drive.  The car ride home mother began talking about many more delusions, states daughter is trying to take car and house, in on it with he people who live in her attic.  She has thrown out two coffee pots, because she is unable to make a pot of coffee. Things are very intense right now, she believes people are trying to move into her house, and her daughter is in on it.  Daughter. Mom is demanding her car keys. Daughter is very concerned with what will happen to her if she drives. daughter needs for someone to explain to her she can not drive. Mom is screaming at he most of the time.  At this point, she doesn't even know if she will get in the car with her. Daughter states she is not sleeping all day, however she doesn't know how much information is the truth when she is gone.  If you have any suggestion, she would greatly appreciate it.

## 2014-01-21 NOTE — Telephone Encounter (Signed)
I sent Amlodipine 10mg  (higher dose) to pharmacy last week at visit; I will resend prescription to pharmacy.  I am happy to see patient Wednesday, 7/29 in clinic. I will have scheduling contact daughter for an appointment on Wednesday, 7/29.

## 2014-01-23 NOTE — Telephone Encounter (Signed)
Call daughter for another update ---- has she contacted neurosurgery about patient's persistent behavior?  If not, recommend her contacting them for another appointment.

## 2014-01-23 NOTE — Telephone Encounter (Signed)
lmom for pt to cb

## 2014-01-27 ENCOUNTER — Ambulatory Visit: Payer: Medicare Other | Admitting: Family Medicine

## 2014-01-27 NOTE — Telephone Encounter (Signed)
Has been a little emotional has been seeing her cry at times.  Has been given back her keys.  Mom is ready assume control.  Has become a power struggle.  She does not want any help.  She will try and get her in to see the doctor.  Pt has been very angry.  Will try and get her in to see Dr.

## 2014-01-27 NOTE — Telephone Encounter (Signed)
Lm on Kristi's VM

## 2014-02-04 NOTE — Telephone Encounter (Signed)
Left message on voicemail of daughter, [name], to check pt's status.

## 2014-02-05 NOTE — Telephone Encounter (Addendum)
Spoke to daughter, [name], regarding pt's status.  Pt got very adamant that she wanted to drive.  Pt still unable to make coffee at home.  Since last visit, pt and daughter have been arguing.  Daughter started skipping days to visit due to extensive arguing.  Daughter feels that patient is taking medicine fairly well.  Daughter gave pt key to car; gave pt checks back.  Pt accused daughter of being part of the conspiracy.  Pt refused to return to office for follow-up appointment. Yesterday was the first day that they had a normal conversation.  Daughter has given up.  Pt is now talking with daughter more.  Was a control struggle.  Pt does not want daughter scheduling appointments.  Daughter called office and requested that contact be with patient.  Pt has scheduled a hair appointment.  Pt does not plan on driving.  Daughter does not know what to do right now.  Daughter is willing to support her but will not insist that patient attend appointments.  Feels that pt is mad at the world.  Does not feel that patient is eating as well.  Daughter is trying to avoid conflict.  Daughter thinks that patient is taking Risperdal last week; did not take it yesterday.  Daughter took pt to supper two days ago; pt sat in front of restaurant and refused to eat with patient.

## 2014-04-02 ENCOUNTER — Encounter: Payer: Self-pay | Admitting: Family Medicine

## 2014-04-02 ENCOUNTER — Ambulatory Visit (INDEPENDENT_AMBULATORY_CARE_PROVIDER_SITE_OTHER): Payer: Medicare Other | Admitting: Family Medicine

## 2014-04-02 VITALS — BP 122/76 | HR 71 | Temp 98.2°F | Resp 16 | Ht 61.0 in | Wt 123.6 lb

## 2014-04-02 DIAGNOSIS — G919 Hydrocephalus, unspecified: Secondary | ICD-10-CM

## 2014-04-02 DIAGNOSIS — D432 Neoplasm of uncertain behavior of brain, unspecified: Secondary | ICD-10-CM

## 2014-04-02 DIAGNOSIS — I10 Essential (primary) hypertension: Secondary | ICD-10-CM

## 2014-04-02 DIAGNOSIS — R441 Visual hallucinations: Secondary | ICD-10-CM

## 2014-04-02 DIAGNOSIS — G911 Obstructive hydrocephalus: Secondary | ICD-10-CM

## 2014-04-02 DIAGNOSIS — D496 Neoplasm of unspecified behavior of brain: Secondary | ICD-10-CM

## 2014-04-02 DIAGNOSIS — Z23 Encounter for immunization: Secondary | ICD-10-CM

## 2014-04-02 DIAGNOSIS — R410 Disorientation, unspecified: Secondary | ICD-10-CM

## 2014-04-02 DIAGNOSIS — R911 Solitary pulmonary nodule: Secondary | ICD-10-CM

## 2014-04-02 LAB — CBC WITH DIFFERENTIAL/PLATELET
BASOS PCT: 1 % (ref 0–1)
Basophils Absolute: 0.1 10*3/uL (ref 0.0–0.1)
Eosinophils Absolute: 0.1 10*3/uL (ref 0.0–0.7)
Eosinophils Relative: 2 % (ref 0–5)
HCT: 44.2 % (ref 36.0–46.0)
HEMOGLOBIN: 14.7 g/dL (ref 12.0–15.0)
Lymphocytes Relative: 21 % (ref 12–46)
Lymphs Abs: 1.4 10*3/uL (ref 0.7–4.0)
MCH: 28.4 pg (ref 26.0–34.0)
MCHC: 33.3 g/dL (ref 30.0–36.0)
MCV: 85.3 fL (ref 78.0–100.0)
MONO ABS: 0.4 10*3/uL (ref 0.1–1.0)
MONOS PCT: 6 % (ref 3–12)
Neutro Abs: 4.7 10*3/uL (ref 1.7–7.7)
Neutrophils Relative %: 70 % (ref 43–77)
Platelets: 260 10*3/uL (ref 150–400)
RBC: 5.18 MIL/uL — ABNORMAL HIGH (ref 3.87–5.11)
RDW: 14.5 % (ref 11.5–15.5)
WBC: 6.7 10*3/uL (ref 4.0–10.5)

## 2014-04-03 LAB — COMPLETE METABOLIC PANEL WITH GFR
ALK PHOS: 95 U/L (ref 39–117)
ALT: 14 U/L (ref 0–35)
AST: 23 U/L (ref 0–37)
Albumin: 4.3 g/dL (ref 3.5–5.2)
BILIRUBIN TOTAL: 0.4 mg/dL (ref 0.2–1.2)
BUN: 25 mg/dL — AB (ref 6–23)
CO2: 26 mEq/L (ref 19–32)
CREATININE: 1.07 mg/dL (ref 0.50–1.10)
Calcium: 10.6 mg/dL — ABNORMAL HIGH (ref 8.4–10.5)
Chloride: 108 mEq/L (ref 96–112)
GFR, Est African American: 58 mL/min — ABNORMAL LOW
GFR, Est Non African American: 50 mL/min — ABNORMAL LOW
GLUCOSE: 82 mg/dL (ref 70–99)
Potassium: 4 mEq/L (ref 3.5–5.3)
Sodium: 143 mEq/L (ref 135–145)
Total Protein: 7.1 g/dL (ref 6.0–8.3)
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
## 2014-07-02 ENCOUNTER — Encounter: Payer: Self-pay | Admitting: Family Medicine

## 2014-07-02 ENCOUNTER — Ambulatory Visit (INDEPENDENT_AMBULATORY_CARE_PROVIDER_SITE_OTHER): Payer: 59 | Admitting: Family Medicine

## 2014-07-02 VITALS — BP 130/72 | HR 78 | Temp 98.4°F | Resp 16 | Ht 61.0 in | Wt 124.0 lb

## 2014-07-02 DIAGNOSIS — I2510 Atherosclerotic heart disease of native coronary artery without angina pectoris: Secondary | ICD-10-CM

## 2014-07-02 DIAGNOSIS — G911 Obstructive hydrocephalus: Secondary | ICD-10-CM

## 2014-07-02 DIAGNOSIS — R440 Auditory hallucinations: Secondary | ICD-10-CM

## 2014-07-02 DIAGNOSIS — R441 Visual hallucinations: Secondary | ICD-10-CM

## 2014-07-02 DIAGNOSIS — I10 Essential (primary) hypertension: Secondary | ICD-10-CM

## 2014-07-02 DIAGNOSIS — R911 Solitary pulmonary nodule: Secondary | ICD-10-CM

## 2014-07-02 DIAGNOSIS — E785 Hyperlipidemia, unspecified: Secondary | ICD-10-CM

## 2014-07-02 DIAGNOSIS — Z720 Tobacco use: Secondary | ICD-10-CM

## 2014-07-02 LAB — COMPREHENSIVE METABOLIC PANEL
ALK PHOS: 89 U/L (ref 39–117)
ALT: 16 U/L (ref 0–35)
AST: 24 U/L (ref 0–37)
Albumin: 4.1 g/dL (ref 3.5–5.2)
BILIRUBIN TOTAL: 0.4 mg/dL (ref 0.2–1.2)
BUN: 15 mg/dL (ref 6–23)
CHLORIDE: 106 meq/L (ref 96–112)
CO2: 27 mEq/L (ref 19–32)
Calcium: 10.3 mg/dL (ref 8.4–10.5)
Creat: 0.99 mg/dL (ref 0.50–1.10)
GLUCOSE: 79 mg/dL (ref 70–99)
POTASSIUM: 4.2 meq/L (ref 3.5–5.3)
SODIUM: 140 meq/L (ref 135–145)
TOTAL PROTEIN: 6.8 g/dL (ref 6.0–8.3)

## 2014-07-02 LAB — CBC WITH DIFFERENTIAL/PLATELET
BASOS ABS: 0.1 10*3/uL (ref 0.0–0.1)
Basophils Relative: 1 % (ref 0–1)
EOS PCT: 1 % (ref 0–5)
Eosinophils Absolute: 0.1 10*3/uL (ref 0.0–0.7)
HCT: 43.1 % (ref 36.0–46.0)
HEMOGLOBIN: 14.3 g/dL (ref 12.0–15.0)
LYMPHS PCT: 18 % (ref 12–46)
Lymphs Abs: 1.4 10*3/uL (ref 0.7–4.0)
MCH: 29 pg (ref 26.0–34.0)
MCHC: 33.2 g/dL (ref 30.0–36.0)
MCV: 87.4 fL (ref 78.0–100.0)
MPV: 10.8 fL (ref 8.6–12.4)
Monocytes Absolute: 0.5 10*3/uL (ref 0.1–1.0)
Monocytes Relative: 6 % (ref 3–12)
NEUTROS PCT: 74 % (ref 43–77)
Neutro Abs: 5.9 10*3/uL (ref 1.7–7.7)
Platelets: 267 10*3/uL (ref 150–400)
RBC: 4.93 MIL/uL (ref 3.87–5.11)
RDW: 14.3 % (ref 11.5–15.5)
WBC: 8 10*3/uL (ref 4.0–10.5)

## 2014-07-02 LAB — POCT URINALYSIS DIPSTICK
Glucose, UA: NEGATIVE
Ketones, UA: 15
Leukocytes, UA: NEGATIVE
NITRITE UA: NEGATIVE
Protein, UA: 100
SPEC GRAV UA: 1.02
Urobilinogen, UA: 1
pH, UA: 5

## 2014-07-02 LAB — LIPID PANEL
CHOLESTEROL: 153 mg/dL (ref 0–200)
HDL: 57 mg/dL (ref >39)
LDL Cholesterol: 71 mg/dL (ref 0–99)
Total CHOL/HDL Ratio: 2.7 Ratio
Triglycerides: 127 mg/dL (ref <150)
VLDL: 25 mg/dL (ref 0–40)

## 2014-07-02 NOTE — Patient Instructions (Signed)
1. STOP AMLODIPINE 10MG  DAILY. 2. RESTART LOSARTAN 100MG  ONE TABLET DAILY. 3.  STOP TRAZODONE 50MG  DAILY.

## 2014-07-03 NOTE — Progress Notes (Signed)
Subjective:    Patient ID: [name], female    DOB: 05-11-1935, 80 y.o.   MRN: [id number]  07/02/2014  Follow-up; Hypertension; and Hyperlipidemia   HPI This 80 y.o. female presents for three month follow-up:  1.  HTN: not checking BP at home; denies CP/palp/SOB/leg swelling.  Taking Amlodipine and Metoprolol daily; stopped Losartan over the summer post-operatively due to decreased fluid and po intake.    2. Hyperlipidemia: Patient reports good compliance with medication (pravastatin and zetia), good tolerance to medication, and good symptom control.  Denies HA/dizziness/focal weakness/paresthesias.  3.  Visual hallucinations/delirium: stable; no recent issues; mood is stable.  Stopped making coffee due to errors; got rid of coffee pot but is now drinking instant coffee.  Did help cook Christmas dinner successfully.  Pt absolutely ready to get license back.  Would like to go to grocery store and pharmacy independently. Compliance with Trazodone qhs and Risperdal qhs.  Has started doing own laundry successfully.  Pt requesting to decrease number of medications that she is taking; she takes 11 different medications daily.  4.  Lung nodule: still not interested in undergoing PET scan; continues to smoke.   Not interested in undergoing surgery; thus, not interested in undergoing CT.  5. Acquired obstructive hydrocephalus: doing well; no follow-up with NS.    6. CAD: just received notice from [name] office that it is time for yearly follow-up.  Ran out of ASA but plans to pick up today at pharmacy. Denies CP/palp/SOB/leg swelling.    Review of Systems  Constitutional: Negative for fever, chills, diaphoresis and fatigue.  Eyes: Negative for visual disturbance.  Respiratory: Negative for cough and shortness of breath.   Cardiovascular: Negative for chest pain, palpitations and leg swelling.  Gastrointestinal: Negative for nausea, vomiting, abdominal pain, diarrhea and  constipation.  Endocrine: Negative for cold intolerance, heat intolerance, polydipsia, polyphagia and polyuria.  Neurological: Negative for dizziness, tremors, seizures, syncope, facial asymmetry, speech difficulty, weakness, light-headedness, numbness and headaches.  Psychiatric/Behavioral: Negative for suicidal ideas, hallucinations, sleep disturbance, self-injury and dysphoric mood. The patient is not nervous/anxious and is not hyperactive.     Past Medical History  Diagnosis Date  . Hyperlipidemia   . Essential hypertension, benign   . CAD (coronary artery disease)   . Osteoporosis   . Hypercalcemia   . Vitamin D deficiency   . Ovarian tumor   . Breast tumor   . MI (myocardial infarction) July 1995    Treated with LAD PCI- Dr Adora Fridge  . Cataract   . Complication of anesthesia   . PONV (postoperative nausea and vomiting)   . Lung tumor     recently found approx 25 days Simone Curia, MD   Past Surgical History  Procedure Laterality Date  . Angioplasty    . Hip pinning    . Breast lumpectomy    . Abdominal hysterectomy    . Cholecystectomy    . Fracture surgery  Nov 2003    L distal radius fx- Dr. Frederik Pear  . Eye surgery      Cataract removal  . Cardiac catheterization      1990's  . Ventriculoperitoneal shunt Right 12/20/2013    Procedure: SHUNT INSERTION VENTRICULAR-PERITONEAL;  Surgeon: Winfield Cunas, MD;  Location: [facility];  Service: Neurosurgery;  Laterality: Right;  VP shunt insertion   Allergies  Allergen Reactions  . Sulfa Antibiotics Swelling  . Codeine Nausea And Vomiting  . Other Nausea And Vomiting  Some type of pill given for allergic reaction    Current Outpatient Prescriptions  Medication Sig Dispense Refill  . amLODipine (NORVASC) 10 MG tablet Take 1 tablet (10 mg total) by mouth daily. 30 tablet 5  . aspirin 81 MG tablet Take 81 mg by mouth daily.    . calcium citrate (CALCITRATE - DOSED IN MG ELEMENTAL CALCIUM) 950 MG tablet Take  400 mg of elemental calcium by mouth daily.     . cholecalciferol (VITAMIN D) 1000 UNITS tablet Take 2,000 Units by mouth 2 (two) times daily.     Marland Kitchen ezetimibe (ZETIA) 10 MG tablet Take 10 mg by mouth daily.    Marland Kitchen HYDROcodone-acetaminophen (NORCO/VICODIN) 5-325 MG per tablet Take 1 tablet by mouth every 4 (four) hours as needed for moderate pain. 50 tablet 0  . loratadine (CLARITIN) 10 MG tablet Take 10 mg by mouth daily.    Marland Kitchen losartan (COZAAR) 100 MG tablet Take 100 mg by mouth daily.    . metoprolol succinate (TOPROL-XL) 50 MG 24 hr tablet Take 50 mg by mouth daily. Take with or immediately following a meal.    . Multiple Vitamin (MULTIVITAMIN WITH MINERALS) TABS tablet Take 1 tablet by mouth daily. 30 tablet 0  . Polyethyl Glycol-Propyl Glycol (SYSTANE OP) Place 1 drop into both eyes 2 (two) times daily as needed (dry eyes).    . pravastatin (PRAVACHOL) 40 MG tablet Take 1 tablet (40 mg total) by mouth daily. 30 tablet 11  . risperiDONE (RISPERDAL) 0.25 MG tablet Take 1 tablet (0.25 mg total) by mouth 2 (two) times daily. 60 tablet 5  . traZODone (DESYREL) 50 MG tablet Take 1 tablet (50 mg total) by mouth at bedtime. 30 tablet 5   No current facility-administered medications for this visit.       Objective:    BP 130/72 mmHg  Pulse 78  Temp(Src) 98.4 F (36.9 C) (Oral)  Resp 16  Ht 5\' 1"  (1.549 m)  Wt 124 lb (56.246 kg)  BMI 23.44 kg/m2  SpO2 98% Physical Exam  Constitutional: She is oriented to person, place, and time. She appears well-developed and well-nourished. No distress.  HENT:  Head: Normocephalic and atraumatic.  Right Ear: External ear normal.  Left Ear: External ear normal.  Nose: Nose normal.  Mouth/Throat: Oropharynx is clear and moist.  Eyes: Conjunctivae and EOM are normal. Pupils are equal, round, and reactive to light.  Neck: Normal range of motion. Neck supple. Carotid bruit is not present. No thyromegaly present.  Cardiovascular: Normal rate, regular rhythm,  normal heart sounds and intact distal pulses.  Exam reveals no gallop and no friction rub.   No murmur heard. Pulmonary/Chest: Effort normal and breath sounds normal. She has no wheezes. She has no rales.  Abdominal: Soft. Bowel sounds are normal. She exhibits no distension and no mass. There is no tenderness. There is no rebound and no guarding.  Lymphadenopathy:    She has no cervical adenopathy.  Neurological: She is alert and oriented to person, place, and time. No cranial nerve deficit. She exhibits normal muscle tone. Coordination normal.  Skin: Skin is warm and dry. No rash noted. She is not diaphoretic. No erythema. No pallor.  Psychiatric: She has a normal mood and affect. Her behavior is normal. Thought content normal.  Alert and oriented to person, place, time, and location.         Assessment & Plan:   1. Essential hypertension   2. Hyperlipidemia   3. Visual hallucinations  4. Tobacco user   5. Lung nodule   6. Acquired obstructive hydrocephalus   7. Coronary artery disease involving native coronary artery of native heart without angina pectoris   8. Auditory hallucinations     1. HTN: controlled; now recommend stopping Amlodipine and restarting Losartan; continue Metoprolol. 2.  Hyperlipidemia: controlled; obtain labs; no change in management. 3.  Visual and auditory hallucinations: stable; continue Risperdal qhs for now.  Discontinue Trazodone qhs.  Has been stable for past 3-6 months; consider permitting pt to drive at follow-up at next visit.  4.  Lung nodule: pt refusing PET scan. 5.  Tobacco abuse: pre-contemplative. 6.  Acquired obstructive hydrocephalus: stable; s/p shunt placement in 12/2013; no follow-up with NS warranted. 7.  CAD: stable; asymptomatic; restart ASA therapy; due for annual follow-up with Dr. Gwenlyn Found.    No orders of the defined types were placed in this encounter.    Return in about 3 months (around 10/01/2014) for recheck.    Odell Choung  Elayne Guerin, M.D. [facility] [address] [place], [place]  [address] [phone] phone [phone] fax

## 2014-08-17 ENCOUNTER — Other Ambulatory Visit: Payer: Self-pay | Admitting: Cardiovascular Disease

## 2014-08-18 NOTE — Telephone Encounter (Signed)
Rx(s) sent to pharmacy electronically.  

## 2014-09-04 ENCOUNTER — Encounter: Payer: Self-pay | Admitting: *Deleted

## 2014-09-25 ENCOUNTER — Encounter: Payer: Self-pay | Admitting: Cardiovascular Disease

## 2014-09-25 ENCOUNTER — Ambulatory Visit (INDEPENDENT_AMBULATORY_CARE_PROVIDER_SITE_OTHER): Payer: 59 | Admitting: Cardiovascular Disease

## 2014-09-25 VITALS — BP 170/98 | HR 84 | Ht 61.0 in | Wt 127.1 lb

## 2014-09-25 DIAGNOSIS — E785 Hyperlipidemia, unspecified: Secondary | ICD-10-CM

## 2014-09-25 DIAGNOSIS — I10 Essential (primary) hypertension: Secondary | ICD-10-CM

## 2014-09-25 DIAGNOSIS — I2510 Atherosclerotic heart disease of native coronary artery without angina pectoris: Secondary | ICD-10-CM | POA: Diagnosis not present

## 2014-09-25 DIAGNOSIS — I2583 Coronary atherosclerosis due to lipid rich plaque: Secondary | ICD-10-CM

## 2014-09-25 NOTE — Patient Instructions (Signed)
Your physician wants you to follow-up in: 1 Year. You will receive a reminder letter in the mail two months in advance. If you don't receive a letter, please call our office to schedule the follow-up appointment.  

## 2014-09-25 NOTE — Assessment & Plan Note (Signed)
History of hypertension with blood pressure measured today at 162/94. She is on losartan and Toprol. Continue current meds at current dosing.

## 2014-09-25 NOTE — Assessment & Plan Note (Signed)
History of CAD status post LAD angioplasty by myself back in 1995. Her last Myoview performed 05/28/09 was nonischemic. She denies chest pain or shortness of breath.

## 2014-09-25 NOTE — Assessment & Plan Note (Signed)
History of hyperlipidemia on pravastatin 40 mg a day and Zetia with recent lipid profile performed 07/02/14 revealing a total cholesterol of 153, LDL 71 and an HDL of 57.

## 2014-09-25 NOTE — Progress Notes (Signed)
09/25/2014 [name]   1934/08/11  [id number]  Primary Physician Reginia Forts, MD Primary Cardiologist: Lorretta Harp MD [name]   HPI:   The patient is a 80 year old thin-appearing widowed Caucasian female, mother of 1 child, whom I last saw a year ago. She has a history of CAD, status post LAD angioplasty by me back in 1995. Her other problems include hypertension, hyperlipidemia, and hypothyroidism. She denies chest pain or shortness of breath. Her last Myoview, performed May 28, 2009, was nonischemic. Her most recent lipid profile performed 07/02/14 revealed a total cholesterol 153, LDL 71 and HDL of 57.    Current Outpatient Prescriptions  Medication Sig Dispense Refill  . aspirin 81 MG tablet Take 81 mg by mouth daily.    . calcium citrate (CALCITRATE - DOSED IN MG ELEMENTAL CALCIUM) 950 MG tablet Take 400 mg of elemental calcium by mouth daily.     . cholecalciferol (VITAMIN D) 1000 UNITS tablet Take 2,000 Units by mouth 2 (two) times daily.     Marland Kitchen ezetimibe (ZETIA) 10 MG tablet Take 1 tablet (10 mg total) by mouth daily. Must keep appointment 09/25/2014 with Dr Gwenlyn Found for future refills 30 tablet 1  . loratadine (CLARITIN) 10 MG tablet Take 10 mg by mouth daily.    Marland Kitchen losartan (COZAAR) 100 MG tablet Take 100 mg by mouth daily.    . metoprolol succinate (TOPROL-XL) 50 MG 24 hr tablet Take 50 mg by mouth daily. Take with or immediately following a meal.    . Multiple Vitamin (MULTIVITAMIN WITH MINERALS) TABS tablet Take 1 tablet by mouth daily. 30 tablet 0  . pravastatin (PRAVACHOL) 40 MG tablet Take 1 tablet (40 mg total) by mouth daily. 30 tablet 11  . risperiDONE (RISPERDAL) 0.25 MG tablet Take 1 tablet (0.25 mg total) by mouth 2 (two) times daily. 60 tablet 5   No current facility-administered medications for this visit.    Allergies  Allergen Reactions  . Sulfa Antibiotics Swelling  . Codeine Nausea And Vomiting  . Other Nausea And Vomiting    Some type of pill given for allergic reaction     History   Social History  . Marital Status: Widowed    Spouse Name: N/A  . Number of Children: 1  . Years of Education: N/A   Occupational History  . retired    Social History Main Topics  . Smoking status: Current Some Day Smoker -- 1.00 packs/day for 60 years    Types: Cigarettes  . Smokeless tobacco: Not on file  . Alcohol Use: No  . Drug Use: No  . Sexual Activity: No   Other Topics Concern  . Not on file   Social History Narrative   Marital status: widowed.      Children: one daughter [name] [phone])      Employment: retired; Master's degree.      Lives: alone.      Tobacco: daily smoking.     Review of Systems: General: negative for chills, fever, night sweats or weight changes.  Cardiovascular: negative for chest pain, dyspnea on exertion, edema, orthopnea, palpitations, paroxysmal nocturnal dyspnea or shortness of breath Dermatological: negative for rash Respiratory: negative for cough or wheezing Urologic: negative for hematuria Abdominal: negative for nausea, vomiting, diarrhea, bright red blood per rectum, melena, or hematemesis Neurologic: negative for visual changes, syncope, or dizziness All other systems reviewed and are otherwise negative except as noted above.    Blood pressure 170/98, pulse 84, height  5\' 1"  (1.549 m), weight 127 lb 1.6 oz (57.652 kg).  General appearance: alert and no distress Neck: no adenopathy, no carotid bruit, no JVD, supple, symmetrical, trachea midline and thyroid not enlarged, symmetric, no tenderness/mass/nodules Lungs: clear to auscultation bilaterally Heart: regular rate and rhythm, S1, S2 normal, no murmur, click, rub or gallop Extremities: extremities normal, atraumatic, no cyanosis or edema  EKG normal sinus rhythm at 84 with nonspecific ST and T-wave changes. I personally reviewed this EKG  ASSESSMENT AND PLAN:   Hyperlipidemia History of hyperlipidemia on  pravastatin 40 mg a day and Zetia with recent lipid profile performed 07/02/14 revealing a total cholesterol of 153, LDL 71 and an HDL of 57.   CAD (coronary artery disease) History of CAD status post LAD angioplasty by myself back in 1995. Her last Myoview performed 05/28/09 was nonischemic. She denies chest pain or shortness of breath.   HTN (hypertension), benign History of hypertension with blood pressure measured today at 162/94. She is on losartan and Toprol. Continue current meds at current dosing.       Lorretta Harp MD FACP,FACC,FAHA, [facility] 09/25/2014 3:47 PM

## 2014-10-08 ENCOUNTER — Ambulatory Visit (INDEPENDENT_AMBULATORY_CARE_PROVIDER_SITE_OTHER): Payer: Medicare Other | Admitting: Family Medicine

## 2014-10-08 ENCOUNTER — Encounter: Payer: Self-pay | Admitting: Family Medicine

## 2014-10-08 VITALS — BP 130/76 | HR 83 | Temp 98.1°F | Resp 16 | Ht 61.0 in | Wt 125.2 lb

## 2014-10-08 DIAGNOSIS — R911 Solitary pulmonary nodule: Secondary | ICD-10-CM

## 2014-10-08 DIAGNOSIS — I2583 Coronary atherosclerosis due to lipid rich plaque: Secondary | ICD-10-CM

## 2014-10-08 DIAGNOSIS — E785 Hyperlipidemia, unspecified: Secondary | ICD-10-CM

## 2014-10-08 DIAGNOSIS — R441 Visual hallucinations: Secondary | ICD-10-CM

## 2014-10-08 DIAGNOSIS — I2510 Atherosclerotic heart disease of native coronary artery without angina pectoris: Secondary | ICD-10-CM | POA: Diagnosis not present

## 2014-10-08 DIAGNOSIS — G911 Obstructive hydrocephalus: Secondary | ICD-10-CM

## 2014-10-08 DIAGNOSIS — I10 Essential (primary) hypertension: Secondary | ICD-10-CM

## 2014-10-08 DIAGNOSIS — R440 Auditory hallucinations: Secondary | ICD-10-CM | POA: Diagnosis not present

## 2014-10-08 DIAGNOSIS — I493 Ventricular premature depolarization: Secondary | ICD-10-CM

## 2014-10-08 DIAGNOSIS — Z720 Tobacco use: Secondary | ICD-10-CM

## 2014-10-08 LAB — CBC WITH DIFFERENTIAL/PLATELET
BASOS PCT: 0 % (ref 0–1)
Basophils Absolute: 0 10*3/uL (ref 0.0–0.1)
EOS ABS: 0.1 10*3/uL (ref 0.0–0.7)
EOS PCT: 1 % (ref 0–5)
HEMATOCRIT: 45.6 % (ref 36.0–46.0)
HEMOGLOBIN: 15.3 g/dL — AB (ref 12.0–15.0)
LYMPHS ABS: 1.7 10*3/uL (ref 0.7–4.0)
Lymphocytes Relative: 20 % (ref 12–46)
MCH: 29.4 pg (ref 26.0–34.0)
MCHC: 33.6 g/dL (ref 30.0–36.0)
MCV: 87.5 fL (ref 78.0–100.0)
MONO ABS: 0.4 10*3/uL (ref 0.1–1.0)
MPV: 10.6 fL (ref 8.6–12.4)
Monocytes Relative: 5 % (ref 3–12)
Neutro Abs: 6.1 10*3/uL (ref 1.7–7.7)
Neutrophils Relative %: 74 % (ref 43–77)
Platelets: 226 10*3/uL (ref 150–400)
RBC: 5.21 MIL/uL — ABNORMAL HIGH (ref 3.87–5.11)
RDW: 14.3 % (ref 11.5–15.5)
WBC: 8.3 10*3/uL (ref 4.0–10.5)

## 2014-10-08 MED ORDER — RISPERIDONE 0.5 MG PO TABS: 0.5000 mg | ORAL_TABLET | Freq: Every day | ORAL | Status: DC

## 2014-10-08 NOTE — Patient Instructions (Signed)
1.  STOP ZETIA WHEN YOU COMPLETE CURRENT PRESCRIPTION.

## 2014-10-08 NOTE — Progress Notes (Signed)
Subjective:    Patient ID: [name], female    DOB: September 13, 1934, 80 y.o.   MRN: [id number]  10/08/2014  Follow-up; Hyperlipidemia; and Hypertension   HPI This 80 y.o. female presents for three month follow-up:  1. HTN:  Management changes made at last visit include stopping Amlodipine and restarting Losartan 100mg  daily; pt continues to take Metoprolol.  Patient reports good compliance with medication, good tolerance to medication, and good symptom control.  Denies CP/palp/SOB/leg swelling.   2.  Hyperlipidemia: Patient reports good compliance with medication, good tolerance to medication, and good symptom control.  No changes to management made at last visit.   Lab Results  Component Value Date   CHOL 153 07/02/2014   HDL 57 07/02/2014   LDLCALC 71 07/02/2014   TRIG 127 07/02/2014   CHOLHDL 2.7 07/02/2014    3.  CAD: s/p follow-up with Dr. Gwenlyn Found; s/p EKG during cardiology visit; no changes to management made.  4.  Acquired obstructive hydrocephalus:  Stable; denies headaches; has suffered with unsteady gait for the past several years without recent worsening; no recent exercise.  Chronic dizziness with bending over without recent worsening.  No blurred vision, diplopia, numbness, or tingling.  No confusion or memory loss.  5. Auditory hallucinations:   Trazodone stopped at last visit.  Sleeping well; sleeping throughout the day; gets up at different times.  Did not sleep well last night.  Hears people in house at night; knows they are there; they wer in attack; nephew sells drugs in house in the attack; "I can here them clear as day".  Not making it up.  Steals food from refrigerator; also brings in young kids who helps them wrap the drugs up.  One child is three years ol and the other is older. Hand prints are downstairs as well. I'm not crazy; I promise you.  Daughter states that patient has talked about them the same amount as has been for past year.  There are waves of  discussing.  Cops came in and locked the attack door.  The police speak with daughter regularly; police come out intermittently.  There are also people in the trees outside.  Delsa Bern lives across the street; Delsa Bern speaks with daughter.  Taking Risperdal at nighttime; not helping sleep.  No set sleep schedule.  Keeps the house extremely warm.   Paranoid auditory hallucinations have not worsened since last visit; they have been stable for the past year.  Not driving.    6.  Lung nodule: not interested in repeat CXR or PET scan.  7. L arm bruising and skin lesion:      Review of Systems  Constitutional: Negative for fever, chills, diaphoresis and fatigue.  Eyes: Negative for visual disturbance.  Respiratory: Negative for cough and shortness of breath.   Cardiovascular: Negative for chest pain, palpitations and leg swelling.  Gastrointestinal: Negative for nausea, vomiting, abdominal pain, diarrhea and constipation.  Endocrine: Negative for cold intolerance, heat intolerance, polydipsia, polyphagia and polyuria.  Skin: Negative for color change, pallor, rash and wound.  Neurological: Positive for dizziness. Negative for tremors, seizures, syncope, facial asymmetry, speech difficulty, weakness, light-headedness, numbness and headaches.  Psychiatric/Behavioral: Positive for hallucinations, sleep disturbance and dysphoric mood. Negative for suicidal ideas and self-injury. The patient is nervous/anxious.     Past Medical History  Diagnosis Date  . Hyperlipidemia   . Essential hypertension, benign   . CAD (coronary artery disease)   . Osteoporosis   . Hypercalcemia   .  Vitamin D deficiency   . Ovarian tumor   . Breast tumor   . MI (myocardial infarction) July 1995    Treated with LAD PCI- Dr Adora Fridge  . Cataract   . Complication of anesthesia   . PONV (postoperative nausea and vomiting)   . Lung tumor     recently found approx 25 days Simone Curia, MD   Past Surgical History  Procedure  Laterality Date  . Angioplasty    . Hip pinning    . Breast lumpectomy    . Abdominal hysterectomy    . Cholecystectomy    . Fracture surgery  Nov 2003    L distal radius fx- Dr. Frederik Pear  . Eye surgery      Cataract removal  . Cardiac catheterization      1990's  . Ventriculoperitoneal shunt Right 12/20/2013    Procedure: SHUNT INSERTION VENTRICULAR-PERITONEAL;  Surgeon: Winfield Cunas, MD;  Location: [facility];  Service: Neurosurgery;  Laterality: Right;  VP shunt insertion  . Doppler echocardiography  2010   Allergies  Allergen Reactions  . Sulfa Antibiotics Swelling  . Codeine Nausea And Vomiting  . Other Nausea And Vomiting    Some type of pill given for allergic reaction    Current Outpatient Prescriptions  Medication Sig Dispense Refill  . aspirin 81 MG tablet Take 81 mg by mouth daily.    . calcium citrate (CALCITRATE - DOSED IN MG ELEMENTAL CALCIUM) 950 MG tablet Take 400 mg of elemental calcium by mouth daily.     . cholecalciferol (VITAMIN D) 1000 UNITS tablet Take 2,000 Units by mouth 2 (two) times daily.     Marland Kitchen ezetimibe (ZETIA) 10 MG tablet Take 1 tablet (10 mg total) by mouth daily. Must keep appointment 09/25/2014 with Dr Gwenlyn Found for future refills 30 tablet 1  . loratadine (CLARITIN) 10 MG tablet Take 10 mg by mouth daily.    Marland Kitchen losartan (COZAAR) 100 MG tablet Take 100 mg by mouth daily.    . metoprolol succinate (TOPROL-XL) 50 MG 24 hr tablet Take 50 mg by mouth daily. Take with or immediately following a meal.    . Multiple Vitamin (MULTIVITAMIN WITH MINERALS) TABS tablet Take 1 tablet by mouth daily. 30 tablet 0  . pravastatin (PRAVACHOL) 40 MG tablet Take 1 tablet (40 mg total) by mouth daily. 30 tablet 11  . risperiDONE (RISPERDAL) 0.5 MG tablet Take 1 tablet (0.5 mg total) by mouth at bedtime. 30 tablet 5   No current facility-administered medications for this visit.       Objective:    BP 130/76 mmHg  Pulse 83  Temp(Src) 98.1 F (36.7 C) (Oral)   Resp 16  Ht 5\' 1"  (1.549 m)  Wt 125 lb 3.2 oz (56.79 kg)  BMI 23.67 kg/m2  SpO2 97% Physical Exam  Constitutional: She is oriented to person, place, and time. She appears well-developed and well-nourished. No distress.  HENT:  Head: Normocephalic and atraumatic.  Right Ear: External ear normal.  Left Ear: External ear normal.  Nose: Nose normal.  Mouth/Throat: Oropharynx is clear and moist.  Eyes: Conjunctivae and EOM are normal. Pupils are equal, round, and reactive to light.  Neck: Normal range of motion. Neck supple. Carotid bruit is not present. No thyromegaly present.  Cardiovascular: Normal rate, regular rhythm, normal heart sounds and intact distal pulses.  Exam reveals no gallop and no friction rub.   No murmur heard. Pulmonary/Chest: Effort normal and breath sounds normal. She has  no wheezes. She has no rales.  Abdominal: Soft. Bowel sounds are normal. She exhibits no distension and no mass. There is no tenderness. There is no rebound and no guarding.  Lymphadenopathy:    She has no cervical adenopathy.  Neurological: She is alert and oriented to person, place, and time. No cranial nerve deficit.  Skin: Skin is warm and dry. No rash noted. She is not diaphoretic. No erythema. No pallor.  +ecchymoses L forearm; +1 cm scaling maculopapular lesion proximal forearm.  Non-tender.  Ecchymoses under lesion.  Psychiatric: She has a normal mood and affect. Her behavior is normal.   EKG: NSR; frequent PVCs.     Assessment & Plan:   1. Auditory hallucinations   2. Tobacco user   3. Lung nodule   4. Hyperlipidemia   5. HTN (hypertension), benign   6. Coronary artery disease due to lipid rich plaque   7. Acquired obstructive hydrocephalus   8. PVC (premature ventricular contraction)     1. Auditory hallucinations: persistent without worsening; increase Risperdal to 0.5mg  qhs.  Recommended psychiatric consultation but patient refuses.  Recommended treating underlying depression  and anxiety and pt refused additional medications.  Discussed with daughter at length; will not clear to drive. 2. Tobacco abuser: pre-contemplative. 3.  Lung nodule: pt refuses repeat CXR or PET scan. 4.  Hyperlipidemia: controlled; agreeable to d/c Zetia; continue statin. 5.  HTN: controlled; obtain labs; continue current medications. 6.  CAD: asymptomatic; s/p annual follow-up with Dr. Gwenlyn Found; no changes to management. 7. Acquired obstructive hydrocephalus; stable; no regular follow-up with NS warranted. 8. PVCs: stable; asymptomatic; continue Metoprolol.  Obtain labs.   Meds ordered this encounter  Medications  . risperiDONE (RISPERDAL) 0.5 MG tablet    Sig: Take 1 tablet (0.5 mg total) by mouth at bedtime.    Dispense:  30 tablet    Refill:  5    Return in about 3 months (around 01/07/2015).     Kerry-Anne Mezo Elayne Guerin, M.D. [facility] [address] [place], [place]  [id number] [phone] phone [phone] fax

## 2014-10-09 LAB — COMPREHENSIVE METABOLIC PANEL
ALK PHOS: 80 U/L (ref 39–117)
ALT: 14 U/L (ref 0–35)
AST: 23 U/L (ref 0–37)
Albumin: 4.2 g/dL (ref 3.5–5.2)
BUN: 13 mg/dL (ref 6–23)
CALCIUM: 10.8 mg/dL — AB (ref 8.4–10.5)
CHLORIDE: 108 meq/L (ref 96–112)
CO2: 19 mEq/L (ref 19–32)
Creat: 1.1 mg/dL (ref 0.50–1.10)
Glucose, Bld: 93 mg/dL (ref 70–99)
Potassium: 4.7 mEq/L (ref 3.5–5.3)
SODIUM: 141 meq/L (ref 135–145)
Total Bilirubin: 0.5 mg/dL (ref 0.2–1.2)
Total Protein: 6.9 g/dL (ref 6.0–8.3)

## 2014-10-18 ENCOUNTER — Other Ambulatory Visit: Payer: Self-pay | Admitting: Cardiovascular Disease

## 2014-10-19 ENCOUNTER — Other Ambulatory Visit: Payer: Self-pay | Admitting: Cardiovascular Disease

## 2014-10-20 NOTE — Telephone Encounter (Signed)
Rx(s) sent to pharmacy electronically.  

## 2014-12-15 ENCOUNTER — Ambulatory Visit (INDEPENDENT_AMBULATORY_CARE_PROVIDER_SITE_OTHER): Payer: Medicare Other | Admitting: Family Medicine

## 2014-12-15 ENCOUNTER — Telehealth: Payer: Self-pay

## 2014-12-15 ENCOUNTER — Other Ambulatory Visit: Payer: Self-pay | Admitting: Family Medicine

## 2014-12-15 ENCOUNTER — Encounter: Payer: Self-pay | Admitting: Family Medicine

## 2014-12-15 VITALS — BP 126/90 | HR 49 | Temp 98.4°F | Resp 16 | Ht 61.0 in | Wt 122.8 lb

## 2014-12-15 DIAGNOSIS — Z23 Encounter for immunization: Secondary | ICD-10-CM

## 2014-12-15 DIAGNOSIS — W19XXXA Unspecified fall, initial encounter: Secondary | ICD-10-CM | POA: Diagnosis not present

## 2014-12-15 DIAGNOSIS — D432 Neoplasm of uncertain behavior of brain, unspecified: Secondary | ICD-10-CM

## 2014-12-15 DIAGNOSIS — T148 Other injury of unspecified body region: Secondary | ICD-10-CM

## 2014-12-15 DIAGNOSIS — D496 Neoplasm of unspecified behavior of brain: Secondary | ICD-10-CM

## 2014-12-15 DIAGNOSIS — G911 Obstructive hydrocephalus: Secondary | ICD-10-CM | POA: Diagnosis not present

## 2014-12-15 DIAGNOSIS — R4789 Other speech disturbances: Secondary | ICD-10-CM | POA: Diagnosis not present

## 2014-12-15 DIAGNOSIS — E0590 Thyrotoxicosis, unspecified without thyrotoxic crisis or storm: Secondary | ICD-10-CM | POA: Diagnosis not present

## 2014-12-15 DIAGNOSIS — T07XXXA Unspecified multiple injuries, initial encounter: Secondary | ICD-10-CM

## 2014-12-15 LAB — CBC WITH DIFFERENTIAL/PLATELET
BASOS ABS: 0.1 10*3/uL (ref 0.0–0.1)
Basophils Relative: 1 % (ref 0–1)
Eosinophils Absolute: 0.1 10*3/uL (ref 0.0–0.7)
Eosinophils Relative: 1 % (ref 0–5)
HCT: 43.5 % (ref 36.0–46.0)
Hemoglobin: 14.8 g/dL (ref 12.0–15.0)
Lymphocytes Relative: 19 % (ref 12–46)
Lymphs Abs: 1.4 10*3/uL (ref 0.7–4.0)
MCH: 29.7 pg (ref 26.0–34.0)
MCHC: 34 g/dL (ref 30.0–36.0)
MCV: 87.2 fL (ref 78.0–100.0)
MPV: 12 fL (ref 8.6–12.4)
Monocytes Absolute: 0.4 10*3/uL (ref 0.1–1.0)
Monocytes Relative: 6 % (ref 3–12)
NEUTROS ABS: 5.3 10*3/uL (ref 1.7–7.7)
Neutrophils Relative %: 73 % (ref 43–77)
PLATELETS: 277 10*3/uL (ref 150–400)
RBC: 4.99 MIL/uL (ref 3.87–5.11)
RDW: 13.7 % (ref 11.5–15.5)
WBC: 7.3 10*3/uL (ref 4.0–10.5)

## 2014-12-15 LAB — POCT URINALYSIS DIPSTICK
BILIRUBIN UA: NEGATIVE
GLUCOSE UA: NEGATIVE
KETONES UA: NEGATIVE
LEUKOCYTES UA: NEGATIVE
NITRITE UA: NEGATIVE
Protein, UA: 30
Spec Grav, UA: 1.02
Urobilinogen, UA: 0.2
pH, UA: 5.5

## 2014-12-15 LAB — POCT UA - MICROSCOPIC ONLY
CASTS, UR, LPF, POC: NEGATIVE
CRYSTALS, UR, HPF, POC: NEGATIVE
Mucus, UA: NEGATIVE
YEAST UA: NEGATIVE

## 2014-12-15 LAB — TSH: TSH: 0.019 u[IU]/mL — AB (ref 0.350–4.500)

## 2014-12-15 LAB — COMPREHENSIVE METABOLIC PANEL
ALBUMIN: 3.9 g/dL (ref 3.5–5.2)
ALT: 13 U/L (ref 0–35)
AST: 18 U/L (ref 0–37)
Alkaline Phosphatase: 97 U/L (ref 39–117)
BUN: 26 mg/dL — ABNORMAL HIGH (ref 6–23)
CALCIUM: 11.5 mg/dL — AB (ref 8.4–10.5)
CHLORIDE: 107 meq/L (ref 96–112)
CO2: 27 meq/L (ref 19–32)
CREATININE: 1.12 mg/dL — AB (ref 0.50–1.10)
Glucose, Bld: 102 mg/dL — ABNORMAL HIGH (ref 70–99)
Potassium: 4.8 mEq/L (ref 3.5–5.3)
Sodium: 147 mEq/L — ABNORMAL HIGH (ref 135–145)
Total Bilirubin: 0.4 mg/dL (ref 0.2–1.2)
Total Protein: 6.8 g/dL (ref 6.0–8.3)

## 2014-12-15 NOTE — Progress Notes (Signed)
Subjective:    Patient ID: [name], female    DOB: 23-Jul-1934, 80 y.o.   MRN: [id number]  12/15/2014  Follow-up and pt had 2 falls this weekend, right side sore and pulling   HPI This 80 y.o. female presents for the following:  1. Falls: fell twice this weekend; unable to get self up over the weekend.  [place] again last night in the yard at [employer]; took two neighbors to get pateint back to house.  More unsteady on feet.  Has been wobbly lately.  Did fall to the R for one day.  No headache.  No blurred vision or double vision.  No n/t; no weakness focal.  Daughter has noticed word finding difficulties. Pt denies slurred speech but daughter noticed slurred speech yesterday.  No difficulties swallowing.  Slurred speech was transient during lunch only.  Cat scratched pt on L face; cat was sleeping above pt on sofa; cat fell on patient and scratched pt.  No head trauma.  No loss of consciousness.  Tried to get up but unable to get up; mailbox is brick and grass surrounding; had nothing to hold onto.  No chest pain.  R breast trauma with fall.  No SOB; no leg swelling.  No n/v/d.  Intermittent dysuria; no frequency or urgency.  No dizziness.  Hallucinations auditory and visual:  At last visit, increased Risperdal to 0.59m qhs.  Stopped Trazodone at last visit.      Review of Systems  Constitutional: Negative for fever, chills, diaphoresis and fatigue.  HENT: Negative for congestion, ear pain, postnasal drip, rhinorrhea, sore throat and voice change.   Eyes: Negative for photophobia, pain and visual disturbance.  Respiratory: Negative for cough and shortness of breath.   Cardiovascular: Negative for chest pain, palpitations and leg swelling.  Gastrointestinal: Negative for nausea, vomiting, abdominal pain, diarrhea, blood in stool and anal bleeding.  Endocrine: Negative for cold intolerance, heat intolerance, polydipsia, polyphagia and polyuria.  Genitourinary: Negative for dysuria,  urgency, frequency, hematuria and flank pain.  Musculoskeletal: Positive for myalgias and back pain.  Skin: Positive for wound. Negative for rash.  Neurological: Negative for dizziness, tremors, seizures, syncope, facial asymmetry, speech difficulty, weakness, light-headedness, numbness and headaches.  Psychiatric/Behavioral: Positive for hallucinations. Negative for behavioral problems, confusion, dysphoric mood and agitation. The patient is not nervous/anxious and is not hyperactive.     Past Medical History  Diagnosis Date  . Hyperlipidemia   . Essential hypertension, benign   . CAD (coronary artery disease)   . Osteoporosis   . Hypercalcemia   . Vitamin D deficiency   . Ovarian tumor   . Breast tumor   . MI (myocardial infarction) July 1995    Treated with LAD PCI- Dr JAdora Fridge . Cataract   . Complication of anesthesia   . PONV (postoperative nausea and vomiting)   . Lung tumor     recently found approx 25 days aSimone Curia MD   Past Surgical History  Procedure Laterality Date  . Angioplasty    . Hip pinning    . Breast lumpectomy    . Abdominal hysterectomy    . Cholecystectomy    . Fracture surgery  Nov 2003    L distal radius fx- Dr. FFrederik Pear . Eye surgery      Cataract removal  . Cardiac catheterization      1990's  . Ventriculoperitoneal shunt Right 12/20/2013    Procedure: SHUNT INSERTION VENTRICULAR-PERITONEAL;  Surgeon: KWinfield Cunas MD;  Location: [facility];  Service: Neurosurgery;  Laterality: Right;  VP shunt insertion  . Doppler echocardiography  2010   Allergies  Allergen Reactions  . Sulfa Antibiotics Swelling  . Codeine Nausea And Vomiting  . Other Nausea And Vomiting    Some type of pill given for allergic reaction    Current Outpatient Prescriptions  Medication Sig Dispense Refill  . aspirin 81 MG tablet Take 81 mg by mouth daily.    . calcium citrate (CALCITRATE - DOSED IN MG ELEMENTAL CALCIUM) 950 MG tablet Take 400 mg of  elemental calcium by mouth daily.     . cholecalciferol (VITAMIN D) 1000 UNITS tablet Take 2,000 Units by mouth 2 (two) times daily.     Marland Kitchen ezetimibe (ZETIA) 10 MG tablet Take 1 tablet (10 mg total) by mouth daily. 30 tablet 11  . loratadine (CLARITIN) 10 MG tablet Take 10 mg by mouth daily.    Marland Kitchen losartan (COZAAR) 100 MG tablet Take 1 tablet (100 mg total) by mouth daily. 30 tablet 11  . metoprolol succinate (TOPROL-XL) 50 MG 24 hr tablet Take 50 mg by mouth daily. Take with or immediately following a meal.    . Multiple Vitamin (MULTIVITAMIN WITH MINERALS) TABS tablet Take 1 tablet by mouth daily. 30 tablet 0  . pravastatin (PRAVACHOL) 40 MG tablet Take 1 tablet (40 mg total) by mouth daily. 30 tablet 10  . risperiDONE (RISPERDAL) 0.5 MG tablet Take 1 tablet (0.5 mg total) by mouth at bedtime. 30 tablet 5   No current facility-administered medications for this visit.   History   Social History  . Marital Status: Widowed    Spouse Name: N/A  . Number of Children: 1  . Years of Education: N/A   Occupational History  . retired    Social History Main Topics  . Smoking status: Current Some Day Smoker -- 1.00 packs/day for 60 years    Types: Cigarettes  . Smokeless tobacco: Not on file  . Alcohol Use: No  . Drug Use: No  . Sexual Activity: No   Other Topics Concern  . Not on file   Social History Narrative   Marital status: widowed.      Children: one daughter [name] [phone])      Employment: retired; Master's degree.      Lives: alone.      Tobacco: daily smoking.       Objective:    BP 126/90 mmHg  Pulse 49  Temp(Src) 98.4 F (36.9 C) (Oral)  Resp 16  Ht _0  (1.549 m)  Wt 122 lb 12.8 oz (55.702 kg)  BMI 23.21 kg/m2  SpO2 95% Physical Exam  Constitutional: She is oriented to person, place, and time. She appears well-developed and well-nourished. No distress.  HENT:  Head: Normocephalic and atraumatic.  Right Ear: External ear normal.  Left Ear: External ear  normal.  Nose: Nose normal.  Mouth/Throat: Oropharynx is clear and moist.  Eyes: Conjunctivae and EOM are normal. Pupils are equal, round, and reactive to light.  Neck: Normal range of motion. Neck supple. Carotid bruit is not present. No thyromegaly present.  Cardiovascular: Normal rate, regular rhythm, normal heart sounds and intact distal pulses.  Exam reveals no gallop and no friction rub.   No murmur heard. Pulmonary/Chest: Effort normal and breath sounds normal. She has no wheezes. She has no rales. She exhibits tenderness and bony tenderness. She exhibits no mass, no laceration and no deformity. Right breast exhibits no inverted nipple,  no mass, no nipple discharge, no skin change and no tenderness. Breasts are symmetrical.    Abdominal: Soft. Bowel sounds are normal. She exhibits no distension and no mass. There is no tenderness. There is no rebound and no guarding.  Musculoskeletal:       Right shoulder: Normal. She exhibits normal range of motion, no tenderness and no bony tenderness.       Left shoulder: Normal. She exhibits normal range of motion, no tenderness and no bony tenderness.       Right elbow: Normal.She exhibits normal range of motion and no swelling. No tenderness found.       Left elbow: Normal. She exhibits normal range of motion and no swelling. No tenderness found.       Right wrist: Normal. She exhibits normal range of motion, no tenderness and no bony tenderness.       Left wrist: Normal. She exhibits normal range of motion, no tenderness and no bony tenderness.       Cervical back: Normal. She exhibits normal range of motion, no tenderness, no bony tenderness and no pain.       Arms: Mildly TTP along R anterior chest wall near sternum.  Lymphadenopathy:    She has no cervical adenopathy.  Neurological: She is alert and oriented to person, place, and time. No cranial nerve deficit or sensory deficit. She exhibits normal muscle tone. She displays a negative Romberg  sign. Coordination normal.  Skin: Skin is warm and dry. Abrasion, bruising and ecchymosis noted. No rash noted. She is not diaphoretic. No erythema. No pallor.     Scattered abrasions distal hands L>R.  Ecchymoses present distal L arm.  Psychiatric: She has a normal mood and affect. Her behavior is normal.   Results for orders placed or performed in visit on 12/15/14  CBC with Differential/Platelet  Result Value Ref Range   WBC 7.3 4.0 - 10.5 K/uL   RBC 4.99 3.87 - 5.11 MIL/uL   Hemoglobin 14.8 12.0 - 15.0 g/dL   HCT 43.5 36.0 - 46.0 %   MCV 87.2 78.0 - 100.0 fL   MCH 29.7 26.0 - 34.0 pg   MCHC 34.0 30.0 - 36.0 g/dL   RDW 13.7 11.5 - 15.5 %   Platelets 277 150 - 400 K/uL   MPV 12.0 8.6 - 12.4 fL   Neutrophils Relative % 73 43 - 77 %   Neutro Abs 5.3 1.7 - 7.7 K/uL   Lymphocytes Relative 19 12 - 46 %   Lymphs Abs 1.4 0.7 - 4.0 K/uL   Monocytes Relative 6 3 - 12 %   Monocytes Absolute 0.4 0.1 - 1.0 K/uL   Eosinophils Relative 1 0 - 5 %   Eosinophils Absolute 0.1 0.0 - 0.7 K/uL   Basophils Relative 1 0 - 1 %   Basophils Absolute 0.1 0.0 - 0.1 K/uL   Smear Review Criteria for review not met   Comprehensive metabolic panel  Result Value Ref Range   Sodium 147 (H) 135 - 145 mEq/L   Potassium 4.8 3.5 - 5.3 mEq/L   Chloride 107 96 - 112 mEq/L   CO2 27 19 - 32 mEq/L   Glucose, Bld 102 (H) 70 - 99 mg/dL   BUN 26 (H) 6 - 23 mg/dL   Creat 1.12 (H) 0.50 - 1.10 mg/dL   Total Bilirubin 0.4 0.2 - 1.2 mg/dL   Alkaline Phosphatase 97 39 - 117 U/L   AST 18 0 - 37 U/L  ALT 13 0 - 35 U/L   Total Protein 6.8 6.0 - 8.3 g/dL   Albumin 3.9 3.5 - 5.2 g/dL   Calcium 11.5 (H) 8.4 - 10.5 mg/dL  TSH  Result Value Ref Range   TSH 0.019 (L) 0.350 - 4.500 uIU/mL  POCT urinalysis dipstick  Result Value Ref Range   Color, UA Amber    Clarity, UA Clear    Glucose, UA negative    Bilirubin, UA Negative    Ketones, UA Negative    Spec Grav, UA 1.020    Blood, UA small    pH, UA 5.5    Protein,  UA 30    Urobilinogen, UA 0.2    Nitrite, UA Negative    Leukocytes, UA Negative Negative  POCT UA - Microscopic Only  Result Value Ref Range   WBC, Ur, HPF, POC 0-2    RBC, urine, microscopic 2-7    Bacteria, U Microscopic Trace    Mucus, UA Negative    Epithelial cells, urine per micros 0-2    Crystals, Ur, HPF, POC Negative    Casts, Ur, LPF, POC Negative    Yeast, UA Negative    TDAP ADMINISTERED.    Assessment & Plan:   1. Falls, initial encounter   2. Word finding difficulty   3. Posterior fossa tumor   4. Acquired obstructive hydrocephalus   5. Abrasions of multiple sites   6. Hyperthyroidism    1. Fall:  New; recurrent falls in the past 4 days; obtain labs, urine culture on patient.  Obtain MRI brain to rule out recurrent hydrocephaly or other acute neurological process.  Close follow-up. 2.  Word finding difficulties: New. Associated with falls; obtain labs; onset four days ago; refer for MRI brain; to ED for acute worsening. 3.  Posterior fossa tumor: chronic for twenty years; s/p MRI of brain 2015 and stable.  Not likely contributing to current presentation. 4.  Acquired obstructive hydrocephalus: s/p R VP shunt placement 12/20/13 by Dr. Cyndy Freeze. Obtain MRI brain this week to evaluate brain; will likely warrant follow-up with [facility] to confirm shunt function. 5.  Abrasions multiple sites hands: New. Secondary to falls and to new kitten.  S/p TDAP. Local wound care. 6. Hyperthyroidism: New. Obtain free T4; refer for endocrinology if this has not been evaluated in the past.   7.  Visual and auditory hallucinations: have continued for the past year despite VP shunt placement for hydrocephaly; recent increase three months ago in Risperdal 0.43m to 0.564mqhs; unlikely contributing to current presentation; yet, if work up negative, will decrease dose of Risperdal and follow.  No orders of the defined types were placed in this encounter.    No Follow-up on file.

## 2014-12-15 NOTE — Telephone Encounter (Signed)
pts care giver(power of attorney) called to say pt has fallen twice this weekend, is having delusions, Favoring one side,speech confusion,please advise   Best phone for care giver is [phone]

## 2014-12-15 NOTE — Telephone Encounter (Signed)
Received second message from daughter, [name], who is alarmed about mother; wanted to call 911 this weekend; fell this weekend; fell onto grass; keeps falling.  One of neighbors called last night due to repeat fall.  Misusing words; used cat for dog; used license for folder; called umbrella a chair; repeat misuse of words; lost train of thought; yesterday pt would not answer door; daughter did call 911.  Daughter does not feel like medication side effects.  Drastic change in behavior and speech.  Sometimes would mix up words in past but sporadic; now repetitive.  Happening so much this weekend that interfered with conversation.  A/P: falls and misuse of words:  Recommend immediate evaluation; needs evaluation today.

## 2014-12-16 LAB — T4, FREE: Free T4: 2.19 ng/dL — ABNORMAL HIGH (ref 0.80–1.80)

## 2014-12-16 NOTE — Telephone Encounter (Signed)
Pt came in yesterday

## 2014-12-17 ENCOUNTER — Ambulatory Visit
Admission: RE | Admit: 2014-12-17 | Discharge: 2014-12-17 | Disposition: A | Discharge status: Home / Self Care | Payer: Medicare Other | Source: Ambulatory Visit | Attending: Family Medicine | Admitting: Family Medicine

## 2014-12-17 DIAGNOSIS — R4789 Other speech disturbances: Secondary | ICD-10-CM

## 2014-12-17 DIAGNOSIS — D496 Neoplasm of unspecified behavior of brain: Secondary | ICD-10-CM

## 2014-12-17 DIAGNOSIS — W19XXXA Unspecified fall, initial encounter: Secondary | ICD-10-CM

## 2014-12-17 DIAGNOSIS — G911 Obstructive hydrocephalus: Secondary | ICD-10-CM

## 2014-12-17 LAB — URINE CULTURE

## 2014-12-18 NOTE — Addendum Note (Signed)
Addended by: Wardell Honour on: 12/18/2014 08:59 AM   Modules accepted: Orders

## 2014-12-22 ENCOUNTER — Other Ambulatory Visit: Payer: Self-pay | Admitting: Cardiovascular Disease

## 2014-12-22 NOTE — Telephone Encounter (Signed)
Rx(s) sent to pharmacy electronically.  

## 2014-12-24 ENCOUNTER — Emergency Department (HOSPITAL_COMMUNITY): Payer: Medicare Other

## 2014-12-24 ENCOUNTER — Encounter (HOSPITAL_COMMUNITY): Payer: Self-pay | Admitting: *Deleted

## 2014-12-24 ENCOUNTER — Inpatient Hospital Stay (HOSPITAL_COMMUNITY)
Admission: EM | Admit: 2014-12-24 | Discharge: 2014-12-30 | DRG: 682 | Disposition: A | Discharge status: Skilled Nursing Facility | Payer: Medicare Other | Attending: Internal Medicine | Admitting: Internal Medicine

## 2014-12-24 DIAGNOSIS — I2510 Atherosclerotic heart disease of native coronary artery without angina pectoris: Secondary | ICD-10-CM | POA: Diagnosis present

## 2014-12-24 DIAGNOSIS — E0590 Thyrotoxicosis, unspecified without thyrotoxic crisis or storm: Secondary | ICD-10-CM | POA: Diagnosis not present

## 2014-12-24 DIAGNOSIS — Z982 Presence of cerebrospinal fluid drainage device: Secondary | ICD-10-CM

## 2014-12-24 DIAGNOSIS — G9340 Encephalopathy, unspecified: Secondary | ICD-10-CM | POA: Diagnosis present

## 2014-12-24 DIAGNOSIS — E860 Dehydration: Secondary | ICD-10-CM | POA: Diagnosis present

## 2014-12-24 DIAGNOSIS — G911 Obstructive hydrocephalus: Secondary | ICD-10-CM | POA: Diagnosis present

## 2014-12-24 DIAGNOSIS — N179 Acute kidney failure, unspecified: Secondary | ICD-10-CM | POA: Diagnosis present

## 2014-12-24 DIAGNOSIS — E870 Hyperosmolality and hypernatremia: Secondary | ICD-10-CM | POA: Diagnosis present

## 2014-12-24 DIAGNOSIS — R911 Solitary pulmonary nodule: Secondary | ICD-10-CM | POA: Diagnosis present

## 2014-12-24 DIAGNOSIS — E876 Hypokalemia: Secondary | ICD-10-CM | POA: Diagnosis present

## 2014-12-24 DIAGNOSIS — Z809 Family history of malignant neoplasm, unspecified: Secondary | ICD-10-CM

## 2014-12-24 DIAGNOSIS — E785 Hyperlipidemia, unspecified: Secondary | ICD-10-CM | POA: Diagnosis present

## 2014-12-24 DIAGNOSIS — T40605A Adverse effect of unspecified narcotics, initial encounter: Secondary | ICD-10-CM

## 2014-12-24 DIAGNOSIS — Z8249 Family history of ischemic heart disease and other diseases of the circulatory system: Secondary | ICD-10-CM

## 2014-12-24 DIAGNOSIS — F0390 Unspecified dementia without behavioral disturbance: Secondary | ICD-10-CM | POA: Diagnosis present

## 2014-12-24 DIAGNOSIS — F17210 Nicotine dependence, cigarettes, uncomplicated: Secondary | ICD-10-CM | POA: Diagnosis present

## 2014-12-24 DIAGNOSIS — R4182 Altered mental status, unspecified: Secondary | ICD-10-CM

## 2014-12-24 DIAGNOSIS — I10 Essential (primary) hypertension: Secondary | ICD-10-CM | POA: Diagnosis present

## 2014-12-24 DIAGNOSIS — R0989 Other specified symptoms and signs involving the circulatory and respiratory systems: Secondary | ICD-10-CM

## 2014-12-24 DIAGNOSIS — Z7982 Long term (current) use of aspirin: Secondary | ICD-10-CM

## 2014-12-24 DIAGNOSIS — M810 Age-related osteoporosis without current pathological fracture: Secondary | ICD-10-CM | POA: Diagnosis present

## 2014-12-24 DIAGNOSIS — Z823 Family history of stroke: Secondary | ICD-10-CM | POA: Diagnosis not present

## 2014-12-24 DIAGNOSIS — I252 Old myocardial infarction: Secondary | ICD-10-CM

## 2014-12-24 DIAGNOSIS — F0391 Unspecified dementia with behavioral disturbance: Secondary | ICD-10-CM | POA: Diagnosis not present

## 2014-12-24 DIAGNOSIS — Z9049 Acquired absence of other specified parts of digestive tract: Secondary | ICD-10-CM | POA: Diagnosis present

## 2014-12-24 DIAGNOSIS — R440 Auditory hallucinations: Secondary | ICD-10-CM

## 2014-12-24 DIAGNOSIS — Z79899 Other long term (current) drug therapy: Secondary | ICD-10-CM | POA: Diagnosis not present

## 2014-12-24 LAB — COMPREHENSIVE METABOLIC PANEL
ALT: 22 U/L (ref 14–54)
AST: 43 U/L — ABNORMAL HIGH (ref 15–41)
Albumin: 3.9 g/dL (ref 3.5–5.0)
Alkaline Phosphatase: 114 U/L (ref 38–126)
Anion gap: 10 (ref 5–15)
BUN: 44 mg/dL — ABNORMAL HIGH (ref 6–20)
CO2: 24 mmol/L (ref 22–32)
Calcium: 12.5 mg/dL — ABNORMAL HIGH (ref 8.9–10.3)
Chloride: 109 mmol/L (ref 101–111)
Creatinine, Ser: 3.11 mg/dL — ABNORMAL HIGH (ref 0.44–1.00)
GFR calc Af Amer: 15 mL/min — ABNORMAL LOW (ref >60)
GFR, EST NON AFRICAN AMERICAN: 13 mL/min — AB (ref >60)
Glucose, Bld: 124 mg/dL — ABNORMAL HIGH (ref 65–99)
POTASSIUM: 3.9 mmol/L (ref 3.5–5.1)
SODIUM: 143 mmol/L (ref 135–145)
Total Bilirubin: 0.9 mg/dL (ref 0.3–1.2)
Total Protein: 7 g/dL (ref 6.5–8.1)

## 2014-12-24 LAB — CBC
HCT: 45.3 % (ref 36.0–46.0)
Hemoglobin: 14.7 g/dL (ref 12.0–15.0)
MCH: 29.1 pg (ref 26.0–34.0)
MCHC: 32.5 g/dL (ref 30.0–36.0)
MCV: 89.5 fL (ref 78.0–100.0)
PLATELETS: 236 10*3/uL (ref 150–400)
RBC: 5.06 MIL/uL (ref 3.87–5.11)
RDW: 13.4 % (ref 11.5–15.5)
WBC: 13.1 10*3/uL — ABNORMAL HIGH (ref 4.0–10.5)

## 2014-12-24 LAB — URINALYSIS, ROUTINE W REFLEX MICROSCOPIC
Bilirubin Urine: NEGATIVE
GLUCOSE, UA: NEGATIVE mg/dL
Ketones, ur: NEGATIVE mg/dL
LEUKOCYTES UA: NEGATIVE
Nitrite: NEGATIVE
Protein, ur: 100 mg/dL — AB
Specific Gravity, Urine: 1.01 (ref 1.005–1.030)
Urobilinogen, UA: 0.2 mg/dL (ref 0.0–1.0)
pH: 5.5 (ref 5.0–8.0)

## 2014-12-24 LAB — URINE MICROSCOPIC-ADD ON

## 2014-12-24 LAB — TROPONIN I: Troponin I: 0.05 ng/mL — ABNORMAL HIGH (ref <0.031)

## 2014-12-24 MED ORDER — SODIUM CHLORIDE 0.9 % IV SOLN
INTRAVENOUS | Status: DC
  Administered 2014-12-24: 21:00:00 via INTRAVENOUS

## 2014-12-24 MED ORDER — SODIUM CHLORIDE 0.9 % IV BOLUS (SEPSIS)
1000.0000 mL | Freq: Once | INTRAVENOUS | Status: AC
Start: 2014-12-24 — End: 2014-12-25
  Administered 2014-12-24: 1000 mL via INTRAVENOUS

## 2014-12-24 NOTE — ED Notes (Signed)
Pt transported to radiology for testing

## 2014-12-24 NOTE — H&P (Addendum)
[facility] Admission History and Physical       [name] YJE:[id number] DOB: [birth year] DOA: 12/24/2014  Referring physician: EDP PCP: Reginia Forts, MD  Specialists:   Chief Complaint: Increased Confusion  HPI: [name] is a 80 y.o. female with a history of CAD, HTN, Hyperlipidemia, Hydrocephalus S/P VPS, Dementia, Hypercalcemia, and Lung Tumor who presents to the ED after her daughter had found her in her front yard today on the ground more confused and partially clothed.   She reports that her mother has had a decline over the past year and had been placed on Respirdone Rx  But seemed to continue to decline, and this medication was increased 2 months ago and she seems to be doing worse.   She also was diagnosed with a Lung Tumor when she had been found to have hydrocephalus and she had a VPS placed at that time (11/2013) and the workup for the Lung Tumor was postponed until later but the daughter reports that the afterward the patient has refused to have a further workup despite discussions with her PCP several times.   She has undergone an MRI of the Brain as an outpatient which was negative for acute findings.    A Ct scan was performed in ED and was negative for acute findings.  She was found to have an elevated BUN/Cr and Calcium level and was referred for admission.       Review of Systems: Unable to Obtain from the Patient   Past Medical History  Diagnosis Date  . Hyperlipidemia   . Essential hypertension, benign   . CAD (coronary artery disease)   . Osteoporosis   . Hypercalcemia   . Vitamin D deficiency   . Ovarian tumor   . Breast tumor   . MI (myocardial infarction) July 1995    Treated with LAD PCI- Dr Adora Fridge  . Cataract   . Complication of anesthesia   . PONV (postoperative nausea and vomiting)   . Lung tumor     recently found approx 25 days Simone Curia, MD     Past Surgical History  Procedure Laterality Date  . Angioplasty     . Hip pinning    . Breast lumpectomy    . Abdominal hysterectomy    . Cholecystectomy    . Fracture surgery  Nov 2003    L distal radius fx- Dr. Frederik Pear  . Eye surgery      Cataract removal  . Cardiac catheterization      1990's  . Ventriculoperitoneal shunt Right 12/20/2013    Procedure: SHUNT INSERTION VENTRICULAR-PERITONEAL;  Surgeon: Winfield Cunas, MD;  Location: [facility];  Service: Neurosurgery;  Laterality: Right;  VP shunt insertion  . Doppler echocardiography  2010      Prior to Admission medications   Medication Sig Start Date End Date Taking? Authorizing Provider  aspirin 81 MG tablet Take 81 mg by mouth daily.   Yes Historical Provider, MD  calcium citrate (CALCITRATE - DOSED IN MG ELEMENTAL CALCIUM) 950 MG tablet Take 400 mg of elemental calcium by mouth daily.    Yes Historical Provider, MD  cholecalciferol (VITAMIN D) 1000 UNITS tablet Take 2,000 Units by mouth 2 (two) times daily.    Yes Historical Provider, MD  loratadine (CLARITIN) 10 MG tablet Take 10 mg by mouth daily.   Yes Historical Provider, MD  losartan (COZAAR) 100 MG tablet Take 1 tablet (100 mg total) by mouth daily. 10/20/14  Yes  Lorretta Harp, MD  metoprolol succinate (TOPROL-XL) 50 MG 24 hr tablet Take 1 tablet (50 mg total) by mouth daily. 12/22/14  Yes Lorretta Harp, MD  Multiple Vitamin (MULTIVITAMIN WITH MINERALS) TABS tablet Take 1 tablet by mouth daily. 11/25/13  Yes Robbie Lis, MD  pravastatin (PRAVACHOL) 40 MG tablet Take 1 tablet (40 mg total) by mouth daily. 10/20/14  Yes Lorretta Harp, MD  risperiDONE (RISPERDAL) 0.5 MG tablet Take 1 tablet (0.5 mg total) by mouth at bedtime. 10/08/14  Yes Wardell Honour, MD  ezetimibe (ZETIA) 10 MG tablet Take 1 tablet (10 mg total) by mouth daily. Patient not taking: Reported on 12/24/2014 10/20/14   Lorretta Harp, MD     Allergies  Allergen Reactions  . Sulfa Antibiotics Swelling  . Codeine Nausea And Vomiting  . Other Nausea And  Vomiting    Some type of pain pill given for allergic reaction     Social History:  reports that she has been smoking Cigarettes.  She has a 60 pack-year smoking history. She does not have any smokeless tobacco history on file. She reports that she does not drink alcohol or use illicit drugs.    Family History  Problem Relation Age of Onset  . Stroke Mother   . Heart disease Father   . Cancer Sister        Physical Exam:  GEN:  Pleasant and Confused  Obese 80 y.o. Caucasian female examined and in no acute distress; Mildly uncooperative with exam Filed Vitals:   12/24/14 2004 12/24/14 2132 12/24/14 2200 12/24/14 2230  BP: 187/79 201/95 181/77 185/85  Pulse: 87 90  87  Temp: 97.3 F (36.3 C)     TempSrc: Oral     Resp: 18 16 15 16   Height: 5\' 4"  (1.626 m)     Weight: 55.339 kg (122 lb)     SpO2: 95% 86%  97%   Blood pressure 185/85, pulse 87, temperature 97.3 F (36.3 C), temperature source Oral, resp. rate 16, height 5\' 4"  (1.626 m), weight 55.339 kg (122 lb), SpO2 97 %. PSYCH: She is alert and oriented x 1; does not appear anxious does not appear depressed; affect is normal HEENT: Normocephalic and Atraumatic, Mucous membranes pink; PERRLA; EOM intact; Fundi:  Benign;  No scleral icterus, Nares: Patent, Oropharynx: Unable to visualize due to lack of cooperation,    Neck:  FROM, No Cervical Lymphadenopathy nor Thyromegaly or Carotid Bruit; No JVD; Breasts:: Not examined CHEST WALL: No tenderness CHEST: Normal respiration, clear to auscultation bilaterally HEART: Regular rate and rhythm; no murmurs rubs or gallops BACK: No kyphosis or scoliosis; No CVA tenderness ABDOMEN: Positive Bowel Sounds,Obese, Soft Non-Tender, No Rebound or Guarding; No Masses, No Organomegaly. Rectal Exam: Not done EXTREMITIES: No  Cyanosis, Clubbing, or Edema; No Ulcerations. Genitalia: not examined PULSES: 2+ and symmetric SKIN: Normal hydration no rash or ulceration CNS:  Alert and Oriented x 1,  No Focal Deficits Vascular: pulses palpable throughout    Labs on Admission:  Basic Metabolic Panel:  Recent Labs Lab 12/24/14 2023  NA 143  K 3.9  CL 109  CO2 24  GLUCOSE 124*  BUN 44*  CREATININE 3.11*  CALCIUM 12.5*   Liver Function Tests:  Recent Labs Lab 12/24/14 2023  AST 43*  ALT 22  ALKPHOS 114  BILITOT 0.9  PROT 7.0  ALBUMIN 3.9   No results for input(s): LIPASE, AMYLASE in the last 168 hours. No results for input(s): AMMONIA  in the last 168 hours. CBC:  Recent Labs Lab 12/24/14 2023  WBC 13.1*  HGB 14.7  HCT 45.3  MCV 89.5  PLT 236   Cardiac Enzymes:  Recent Labs Lab 12/24/14 2023  TROPONINI 0.05*    BNP (last 3 results) No results for input(s): BNP in the last 8760 hours.  ProBNP (last 3 results) No results for input(s): PROBNP in the last 8760 hours.  CBG: No results for input(s): GLUCAP in the last 168 hours.  Radiological Exams on Admission: Dg Chest 2 View  12/24/2014   CLINICAL DATA:  Confusion for 2 weeks, worse today. Unsteady gait. Hypertension. Initial encounter.  EXAM: CHEST  2 VIEW  COMPARISON:  CT 11/21/2013.  Radiographs 11/20/2013.  FINDINGS: The heart size and mediastinal contours are stable with mild aortic tortuosity. There is stable mild atelectasis or scarring at the left lung base. The lungs are otherwise clear. Ventriculoperitoneal shunt catheter overlies the right chest. Superior endplate compression deformity at L1 appears grossly stable.  IMPRESSION: Stable chest without evidence of acute cardiopulmonary process.   Electronically Signed   By: Richardean Sale M.D.   On: 12/24/2014 21:11   Ct Head Wo Contrast  12/24/2014   CLINICAL DATA:  Altered mental status, hypertension  EXAM: CT HEAD WITHOUT CONTRAST  TECHNIQUE: Contiguous axial images were obtained from the base of the skull through the vertex without intravenous contrast.  COMPARISON:  Six/ 22/16  FINDINGS: No skull fracture is noted. Again noted a right frontal  ventriculostomy catheter with tip in left frontal horn. Stable cerebral atrophy. Again noted periventricular and patchy subcortical chronic white matter disease. Ventricular size is stable from prior exam. Stable small lacunar infarct bilateral thalamus. No acute cortical infarction. Stable vascular calcified lesion at the fourth ventricle.  No intracranial hemorrhage, mass effect or midline shift.  IMPRESSION: No acute intracranial abnormality. Stable right frontal ventriculostomy catheter position with tip in left frontal horn. Stable atrophy and chronic white matter disease. No definite acute cortical infarction.   Electronically Signed   By: Lahoma Crocker M.D.   On: 12/24/2014 21:18     EKG: Independently reviewed.    Assessment/Plan:   80 y.o. female with   Principal Problem:   1.    AKI (acute kidney injury)- last Cr was 1.2   IVFs for Rehydration   Monitor BUN/Cr   Active Problems:   2.   Hypercalcemia- Multifactorial due to Immobilization vs Dehydration vs Paraneopalastic Syndrome vs Intake   Cardiac monitoring   Discontinue PO calcium and Vitamin D for Now   IVFs   Monitor Calcium levels, if no Decrease with Hydration may need to give IV Aredia     3.   Lung nodule- ? Neoplastic Process- has refused further workup        4.   Acute encephalopathy- Multifactorial   Monitor for Improvement back to her baseline with hydration    And  Decrease in her calcium levels   Trial off Respirdone Rx    5.   Dementia-   Chronic    Check B12, folate, RPR     6.   CAD (coronary artery disease)   stable     7.   Hyperlipidemia   Continue Statin Rx     8.   Acquired obstructive hydrocephalus   Has VP Shunt     9.   Essential hypertension, benign   Monitor BPs   10.   DVT Prophylaxis   Lovenox  Code Status:     FULL CODE    Family Communication:   Daughter at Bedside    Disposition Plan:    Inpatient Status        Time spent:  [facility] Pager [phone]   If [place] Please Contact the Day Rounding Team MD for [facility]  If 7PM-7AM, Please Contact Night-Floor Coverage  [email or web address] Password [facility] 12/24/2014, 11:31 PM     ADDENDUM:   Patient was seen and examined on 12/24/2014

## 2014-12-24 NOTE — ED Provider Notes (Signed)
CSN: [id number]     Arrival date & time 12/24/14  1946 History   First MD Initiated Contact with Patient 12/24/14 1955     Chief Complaint  Patient presents with  . Altered Mental Status     (Consider location/radiation/quality/duration/timing/severity/associated sxs/prior Treatment) HPI Comments: Patient here with altered mental status according to the patient's daughter. Her mental status is been progressively decreasing the past 2 weeks. She does have a history of hydrocephalus and does have a VP shunt. Patient was found in the yard by her daughter resting on her coat as a pillow. No recent fever, vomiting, chills. No recent chest or abdominal pain. No recent changes to her medication. Patient seen by her physician for similar symptoms recently had MRI which was negative for shunt malfunction.  The history is provided by the patient and a relative.    Past Medical History  Diagnosis Date  . Hyperlipidemia   . Essential hypertension, benign   . CAD (coronary artery disease)   . Osteoporosis   . Hypercalcemia   . Vitamin D deficiency   . Ovarian tumor   . Breast tumor   . MI (myocardial infarction) July 1995    Treated with LAD PCI- Dr Adora Fridge  . Cataract   . Complication of anesthesia   . PONV (postoperative nausea and vomiting)   . Lung tumor     recently found approx 25 days Simone Curia, MD   Past Surgical History  Procedure Laterality Date  . Angioplasty    . Hip pinning    . Breast lumpectomy    . Abdominal hysterectomy    . Cholecystectomy    . Fracture surgery  Nov 2003    L distal radius fx- Dr. Frederik Pear  . Eye surgery      Cataract removal  . Cardiac catheterization      1990's  . Ventriculoperitoneal shunt Right 12/20/2013    Procedure: SHUNT INSERTION VENTRICULAR-PERITONEAL;  Surgeon: Winfield Cunas, MD;  Location: [facility];  Service: Neurosurgery;  Laterality: Right;  VP shunt insertion  . Doppler echocardiography  2010   Family History   Problem Relation Age of Onset  . Stroke Mother   . Heart disease Father   . Cancer Sister    History  Substance Use Topics  . Smoking status: Current Some Day Smoker -- 1.00 packs/day for 60 years    Types: Cigarettes  . Smokeless tobacco: Not on file  . Alcohol Use: No   OB History    No data available     Review of Systems  All other systems reviewed and are negative.     Allergies  Sulfa antibiotics; Codeine; and Other  Home Medications   Prior to Admission medications   Medication Sig Start Date End Date Taking? Authorizing Provider  aspirin 81 MG tablet Take 81 mg by mouth daily.    Historical Provider, MD  calcium citrate (CALCITRATE - DOSED IN MG ELEMENTAL CALCIUM) 950 MG tablet Take 400 mg of elemental calcium by mouth daily.     Historical Provider, MD  cholecalciferol (VITAMIN D) 1000 UNITS tablet Take 2,000 Units by mouth 2 (two) times daily.     Historical Provider, MD  ezetimibe (ZETIA) 10 MG tablet Take 1 tablet (10 mg total) by mouth daily. 10/20/14   Lorretta Harp, MD  loratadine (CLARITIN) 10 MG tablet Take 10 mg by mouth daily.    Historical Provider, MD  losartan (COZAAR) 100 MG tablet Take 1 tablet (  100 mg total) by mouth daily. 10/20/14   Lorretta Harp, MD  metoprolol succinate (TOPROL-XL) 50 MG 24 hr tablet Take 1 tablet (50 mg total) by mouth daily. 12/22/14   Lorretta Harp, MD  Multiple Vitamin (MULTIVITAMIN WITH MINERALS) TABS tablet Take 1 tablet by mouth daily. 11/25/13   Robbie Lis, MD  pravastatin (PRAVACHOL) 40 MG tablet Take 1 tablet (40 mg total) by mouth daily. 10/20/14   Lorretta Harp, MD  risperiDONE (RISPERDAL) 0.5 MG tablet Take 1 tablet (0.5 mg total) by mouth at bedtime. 10/08/14   Wardell Honour, MD   BP 187/79 mmHg  Pulse 87  Temp(Src) 97.3 F (36.3 C) (Oral)  Resp 18  Ht 5\' 4"  (1.626 m)  Wt 122 lb (55.339 kg)  BMI 20.93 kg/m2  SpO2 95% Physical Exam  Constitutional: She appears well-developed and well-nourished.   Non-toxic appearance. No distress.  HENT:  Head: Normocephalic and atraumatic.  Eyes: Conjunctivae, EOM and lids are normal. Pupils are equal, round, and reactive to light.  Neck: Normal range of motion. Neck supple. No tracheal deviation present. No thyroid mass present.  Cardiovascular: Normal rate, regular rhythm and normal heart sounds.  Exam reveals no gallop.   No murmur heard. Pulmonary/Chest: Effort normal and breath sounds normal. No stridor. No respiratory distress. She has no decreased breath sounds. She has no wheezes. She has no rhonchi. She has no rales.  Abdominal: Soft. Normal appearance and bowel sounds are normal. She exhibits no distension. There is no tenderness. There is no rebound and no CVA tenderness.  Musculoskeletal: Normal range of motion. She exhibits no edema or tenderness.  Neurological: She is alert. She has normal strength. No cranial nerve deficit or sensory deficit. GCS eye subscore is 4. GCS verbal subscore is 5. GCS motor subscore is 6.  Skin: Skin is warm and dry. No abrasion and no rash noted.  Psychiatric: Her affect is blunt. Her speech is delayed. She is slowed.  Nursing note and vitals reviewed.   ED Course  Procedures (including critical care time) Labs Review Labs Reviewed  URINE CULTURE  CBC  COMPREHENSIVE METABOLIC PANEL  URINALYSIS, ROUTINE W REFLEX MICROSCOPIC (NOT AT [facility])  TROPONIN I    Imaging Review No results found.   EKG Interpretation   Date/Time:  Wednesday December 24 2014 20:01:17 EDT Ventricular Rate:  90 PR Interval:  134 QRS Duration: 66 QT Interval:  346 QTC Calculation: 423 R Axis:   21 Text Interpretation:  Sinus rhythm Ventricular premature complex No  significant change since last tracing Confirmed by Evalie Hargraves  MD, Anahi Belmar  ([id number]) on 12/24/2014 10:06:31 PM      MDM   Final diagnoses:  Altered mental state  Altered mental state    Patient given IV fluids here for likely dehydration. Acute kidney injury  noted. Patient's EKG without signs of STEMI. Will be admitted for dehydration    Lacretia Leigh, MD 12/24/14 2223

## 2014-12-24 NOTE — ED Notes (Signed)
Bed: WA08 Expected date:  Expected time:  Means of arrival:  Comments: EMS 80 yo female with altered mental status

## 2014-12-24 NOTE — ED Notes (Signed)
Pt arrives via EMS from home, hypertensive enroute to facility. Pt lives at home by herself and her daughter comes by to check on her. The daughter came by today and the pt was outside in the yard. Pt daughter says that she saw her yesterday last, her confusion today seems to be the same. She noticed a decline in the pt status about two weeks ago, she has been unsteady on her feet, more confused and less clear in her thoughts. On arrival to the ED the pt is confused, oriented to self only.

## 2014-12-25 DIAGNOSIS — E0590 Thyrotoxicosis, unspecified without thyrotoxic crisis or storm: Secondary | ICD-10-CM

## 2014-12-25 DIAGNOSIS — G911 Obstructive hydrocephalus: Secondary | ICD-10-CM

## 2014-12-25 DIAGNOSIS — N179 Acute kidney failure, unspecified: Principal | ICD-10-CM

## 2014-12-25 LAB — BASIC METABOLIC PANEL
ANION GAP: 10 (ref 5–15)
BUN: 44 mg/dL — ABNORMAL HIGH (ref 6–20)
CO2: 25 mmol/L (ref 22–32)
CREATININE: 2.81 mg/dL — AB (ref 0.44–1.00)
Calcium: 11.5 mg/dL — ABNORMAL HIGH (ref 8.9–10.3)
Chloride: 111 mmol/L (ref 101–111)
GFR, EST AFRICAN AMERICAN: 17 mL/min — AB (ref >60)
GFR, EST NON AFRICAN AMERICAN: 15 mL/min — AB (ref >60)
GLUCOSE: 103 mg/dL — AB (ref 65–99)
Potassium: 3.6 mmol/L (ref 3.5–5.1)
SODIUM: 146 mmol/L — AB (ref 135–145)

## 2014-12-25 LAB — CBC
HEMATOCRIT: 43.2 % (ref 36.0–46.0)
Hemoglobin: 14 g/dL (ref 12.0–15.0)
MCH: 29.6 pg (ref 26.0–34.0)
MCHC: 32.4 g/dL (ref 30.0–36.0)
MCV: 91.3 fL (ref 78.0–100.0)
Platelets: 228 10*3/uL (ref 150–400)
RBC: 4.73 MIL/uL (ref 3.87–5.11)
RDW: 13.5 % (ref 11.5–15.5)
WBC: 11.4 10*3/uL — AB (ref 4.0–10.5)

## 2014-12-25 LAB — PROTEIN / CREATININE RATIO, URINE
CREATININE, URINE: 66.66 mg/dL
Protein Creatinine Ratio: 0.12 mg/mg{Cre} (ref 0.00–0.15)
Total Protein, Urine: 8 mg/dL

## 2014-12-25 MED ORDER — HYDROMORPHONE HCL 1 MG/ML IJ SOLN
0.5000 mg | INTRAMUSCULAR | Status: DC | PRN
  Administered 2014-12-27: 1 mg via INTRAVENOUS
  Filled 2014-12-25: qty 1

## 2014-12-25 MED ORDER — ACETAMINOPHEN 650 MG RE SUPP: 650.0000 mg | Freq: Four times a day (QID) | RECTAL | Status: DC | PRN

## 2014-12-25 MED ORDER — SODIUM CHLORIDE 0.9 % IJ SOLN
3.0000 mL | Freq: Two times a day (BID) | INTRAMUSCULAR | Status: DC
  Administered 2014-12-25 – 2014-12-29 (×7): 3 mL via INTRAVENOUS

## 2014-12-25 MED ORDER — ALUM & MAG HYDROXIDE-SIMETH 200-200-20 MG/5ML PO SUSP: 30.0000 mL | Freq: Four times a day (QID) | ORAL | Status: DC | PRN

## 2014-12-25 MED ORDER — METHIMAZOLE 5 MG PO TABS
15.0000 mg | ORAL_TABLET | Freq: Three times a day (TID) | ORAL | Status: DC
  Administered 2014-12-25 – 2014-12-29 (×14): 15 mg via ORAL
  Filled 2014-12-25 (×17): qty 1

## 2014-12-25 MED ORDER — RISPERIDONE 0.5 MG PO TABS
0.5000 mg | ORAL_TABLET | Freq: Every day | ORAL | Status: DC
  Administered 2014-12-25 – 2014-12-26 (×2): 0.5 mg via ORAL
  Filled 2014-12-25 (×3): qty 1

## 2014-12-25 MED ORDER — DEXTROSE-NACL 5-0.45 % IV SOLN
INTRAVENOUS | Status: AC
  Administered 2014-12-25: 14:00:00 via INTRAVENOUS

## 2014-12-25 MED ORDER — ADULT MULTIVITAMIN W/MINERALS CH
1.0000 | ORAL_TABLET | Freq: Every day | ORAL | Status: DC
  Administered 2014-12-25 – 2014-12-29 (×5): 1 via ORAL
  Filled 2014-12-25 (×6): qty 1

## 2014-12-25 MED ORDER — ACETAMINOPHEN 325 MG PO TABS
650.0000 mg | ORAL_TABLET | Freq: Four times a day (QID) | ORAL | Status: DC | PRN
  Administered 2014-12-27: 650 mg via ORAL
  Filled 2014-12-25: qty 2

## 2014-12-25 MED ORDER — ONDANSETRON HCL 4 MG PO TABS: 4.0000 mg | ORAL_TABLET | Freq: Four times a day (QID) | ORAL | Status: DC | PRN

## 2014-12-25 MED ORDER — METOPROLOL SUCCINATE ER 50 MG PO TB24
50.0000 mg | ORAL_TABLET | Freq: Every day | ORAL | Status: DC
  Administered 2014-12-25 – 2014-12-29 (×5): 50 mg via ORAL
  Filled 2014-12-25 (×6): qty 1

## 2014-12-25 MED ORDER — SODIUM CHLORIDE 0.9 % IV SOLN: INTRAVENOUS | Status: DC

## 2014-12-25 MED ORDER — PRAVASTATIN SODIUM 40 MG PO TABS
40.0000 mg | ORAL_TABLET | Freq: Every day | ORAL | Status: DC
  Administered 2014-12-25 – 2014-12-29 (×5): 40 mg via ORAL
  Filled 2014-12-25 (×6): qty 1

## 2014-12-25 MED ORDER — ENOXAPARIN SODIUM 30 MG/0.3ML ~~LOC~~ SOLN
30.0000 mg | SUBCUTANEOUS | Status: DC
  Administered 2014-12-25 – 2014-12-29 (×5): 30 mg via SUBCUTANEOUS
  Filled 2014-12-25 (×6): qty 0.3

## 2014-12-25 MED ORDER — LORATADINE 10 MG PO TABS
10.0000 mg | ORAL_TABLET | Freq: Every day | ORAL | Status: DC
  Administered 2014-12-25: 10 mg via ORAL
  Filled 2014-12-25: qty 1

## 2014-12-25 MED ORDER — ONDANSETRON HCL 4 MG/2ML IJ SOLN: 4.0000 mg | Freq: Four times a day (QID) | INTRAMUSCULAR | Status: DC | PRN

## 2014-12-25 MED ORDER — SODIUM CHLORIDE 0.9 % IV SOLN
INTRAVENOUS | Status: DC
  Administered 2014-12-25: 02:00:00 via INTRAVENOUS

## 2014-12-25 NOTE — Progress Notes (Signed)
[facility] Progress Note   [name] XTA:[id number] DOB: 1934-10-16 DOA: 12/24/2014 PCP: Reginia Forts, MD  Brief narrative: [name] is a 80 y.o. female past medical history of coronary artery disease, hypertension, hyperlipidemia, hydrocephalus status post VP shunt last year, hypercalcemia, lung nodule which the patient does not want worked up, his paranoia. The patient's daughter gives a history today. She states that the patient has been increasingly confused for the past 2 days. An MRI was performed by her PCP and no acute issues were seen on this MRI. The daughter has been checking on her mother regularly and making sure that she is taking her medications appropriately. She brought her into the hospital when she was found half naked out in her yard. He was found to have acute renal failure and hypercalcemia.   Subjective: Patient has no complaints. Specifically no nausea vomiting, abdominal pain, chest pain, shortness of breath or headache.  Assessment/Plan: Principal Problem:   AKI (acute kidney injury) -Continue to hydrate-UA checked and mild proteinuria noted- check pro/cr ratio -losartan discontinued on admission -Due to hypercalcemia, we'll do an SPEP and UPEP  Active Problems: Hypercalcemia -Chronic hypercalcemia -reviewed labs from last year- calcium levels were 10.5-11.4- she was maintained on calcium and vitamin D supplements as outpatient which I have discontinued -SPEP, UPEP, intact PTH -Continue to hydrate aggressively -May also paraneoplastic syndrome and relation to to nodule seen in lung which the patient never wanted worked up  Acute on chronic encephalopathy -He is been on Risperdal for paranoia -Acute encephalopathy is likely related to acute renal failure and hypercalcemia -once acute encephalopathy resolves, will need a psych eval to see if she is competent at her baseline to make her own decisions  Mild hypernatremia  -Probably  secondary to normal saline infusion-will change to half-normal saline  Hyperthyroidism -This is progressive since 1 year ago when lab work revealed hyperthyroidism Results for DAIJHA, LEGGIO (MRN [id number]) as of 12/25/2014 12:51  Ref. Range 11/20/2013 22:25 11/21/2013 04:28 12/15/2014 17:04  TSH Latest Ref Range: 0.350-4.500 uIU/mL 0.033 (L)  0.019 (L)  Free T4 Latest Ref Range: 0.80-1.80 ng/dL  1.93 (H) 2.19 (H)  T3, Free Latest Ref Range: 2.3-4.2 pg/mL  3.4   -Will start Methimazole    Lung nodule -If the patient is found to be mentally competent and does not want this worked up, no further scans will need to be done    Acquired obstructive hydrocephalus -has a VP shunt which appears to be functioning appropriately based on recent MRI and CT scans    Essential hypertension, benign -Continue Lopressor-hold losartan  Code Status: Full code Family Communication: Daughter who is POA Disposition Plan: correct hypercalcemia, renal failure, hyperthyroidism DVT prophylaxis: Lovenox Consultants: Procedures:  Antibiotics: Anti-infectives    None      Objective: Filed Weights   12/24/14 2004  Weight: 55.339 kg (122 lb)    Intake/Output Summary (Last 24 hours) at 12/25/14 1315 Last data filed at 12/25/14 0300  Gross per 24 hour  Intake 1067.5 ml  Output      0 ml  Net 1067.5 ml     Vitals Filed Vitals:   12/25/14 0055 12/25/14 0100 12/25/14 0148 12/25/14 0525  BP: 189/83 172/82 161/109 147/52  Pulse: 84  88 66  Temp: 97.4 F (36.3 C)  97.7 F (36.5 C) 97.4 F (36.3 C)  TempSrc: Oral   Axillary  Resp: 17 17 18 16   Height:      Weight:  SpO2: 96%  99% 97%    Exam:  General:  Pt is alert, not in acute distress- confused to time and place  HEENT: No icterus, No thrush, oral mucosa moist  Cardiovascular: regular rate and rhythm, S1/S2 No murmur  Respiratory: clear to auscultation bilaterally   Abdomen: Soft, +Bowel sounds, non tender, non distended, no  guarding  MSK: No LE edema, cyanosis or clubbing  Data Reviewed: Basic Metabolic Panel:  Recent Labs Lab 12/24/14 2023 12/25/14 0600  NA 143 146*  K 3.9 3.6  CL 109 111  CO2 24 25  GLUCOSE 124* 103*  BUN 44* 44*  CREATININE 3.11* 2.81*  CALCIUM 12.5* 11.5*   Liver Function Tests:  Recent Labs Lab 12/24/14 2023  AST 43*  ALT 22  ALKPHOS 114  BILITOT 0.9  PROT 7.0  ALBUMIN 3.9   No results for input(s): LIPASE, AMYLASE in the last 168 hours. No results for input(s): AMMONIA in the last 168 hours. CBC:  Recent Labs Lab 12/24/14 2023 12/25/14 0600  WBC 13.1* 11.4*  HGB 14.7 14.0  HCT 45.3 43.2  MCV 89.5 91.3  PLT 236 228   Cardiac Enzymes:  Recent Labs Lab 12/24/14 2023  TROPONINI 0.05*   BNP (last 3 results) No results for input(s): BNP in the last 8760 hours.  ProBNP (last 3 results) No results for input(s): PROBNP in the last 8760 hours.  CBG: No results for input(s): GLUCAP in the last 168 hours.  Recent Results (from the past 240 hour(s))  Urine culture     Status: None   Collection Time: 12/15/14  5:05 PM  Result Value Ref Range Status   Colony Count 60,000 COLONIES/ML  Final   Organism ID, Bacteria Multiple bacterial morphotypes present, none  Final   Organism ID, Bacteria predominant. Suggest appropriate recollection if   Final   Organism ID, Bacteria clinically indicated.  Final  Urine culture     Status: None (Preliminary result)   Collection Time: 12/24/14  8:37 PM  Result Value Ref Range Status   Specimen Description URINE, CATHETERIZED  Final   Special Requests NONE  Final   Culture   Final    NO GROWTH < 24 HOURS Performed at [facility]    Report Status PENDING  Incomplete     Studies: Dg Chest 2 View  12/24/2014   CLINICAL DATA:  Confusion for 2 weeks, worse today. Unsteady gait. Hypertension. Initial encounter.  EXAM: CHEST  2 VIEW  COMPARISON:  CT 11/21/2013.  Radiographs 11/20/2013.  FINDINGS: The heart size  and mediastinal contours are stable with mild aortic tortuosity. There is stable mild atelectasis or scarring at the left lung base. The lungs are otherwise clear. Ventriculoperitoneal shunt catheter overlies the right chest. Superior endplate compression deformity at L1 appears grossly stable.  IMPRESSION: Stable chest without evidence of acute cardiopulmonary process.   Electronically Signed   By: Richardean Sale M.D.   On: 12/24/2014 21:11   Ct Head Wo Contrast  12/24/2014   CLINICAL DATA:  Altered mental status, hypertension  EXAM: CT HEAD WITHOUT CONTRAST  TECHNIQUE: Contiguous axial images were obtained from the base of the skull through the vertex without intravenous contrast.  COMPARISON:  Six/ 22/16  FINDINGS: No skull fracture is noted. Again noted a right frontal ventriculostomy catheter with tip in left frontal horn. Stable cerebral atrophy. Again noted periventricular and patchy subcortical chronic white matter disease. Ventricular size is stable from prior exam. Stable small lacunar infarct bilateral thalamus.  No acute cortical infarction. Stable vascular calcified lesion at the fourth ventricle.  No intracranial hemorrhage, mass effect or midline shift.  IMPRESSION: No acute intracranial abnormality. Stable right frontal ventriculostomy catheter position with tip in left frontal horn. Stable atrophy and chronic white matter disease. No definite acute cortical infarction.   Electronically Signed   By: Lahoma Crocker M.D.   On: 12/24/2014 21:18    Scheduled Meds:  Scheduled Meds: . enoxaparin (LOVENOX) injection  30 mg Subcutaneous Q24H  . loratadine  10 mg Oral Daily  . methimazole  15 mg Oral TID  . metoprolol succinate  50 mg Oral Daily  . pravastatin  40 mg Oral Daily  . sodium chloride  3 mL Intravenous Q12H   Continuous Infusions: . dextrose 5 % and 0.45% NaCl      Time spent on care of this patient: 35 min   [place], MD 12/25/2014, 1:15 PM  LOS: 1 day   [facility] Office  [phone] Pager - Text Page per [email or web address] If 7PM-7AM, please contact night-coverage [email or web address]

## 2014-12-25 NOTE — Progress Notes (Signed)
Attempt was made at in and out catherization, without success.  Bladder scan performed showed 695 ml's.  Patient now more alert and was placed on bedpan, and was able to void.

## 2014-12-25 NOTE — Progress Notes (Addendum)
[facility] Progress Note   LOVELY KERINS ZRA:[id number] DOB: [birth year] DOA: 12/24/2014 PCP: Reginia Forts, MD  Brief narrative: [name] is a 80 y.o. female past medical history of coronary artery disease, hypertension, hyperlipidemia, hydrocephalus status post VP shunt last year, hypercalcemia, lung nodule which the patient does not want worked up and paranoia. The patient's daughter gives a history today. She states that the patient has been increasingly confused for the past 2 weeks. An MRI was performed by her PCP and no acute issues were seen on this MRI. The daughter has been checking on her mother regularly over this 2 wk period and is making sure that she is taking her medications appropriately. She brought her into the hospital when she was found half naked out in her yard. She was found to have acute renal failure and hypercalcemia.   Subjective: Patient has no complaints. Specifically no nausea vomiting, abdominal pain, chest pain, shortness of breath or headache.  Assessment/Plan: Principal Problem:   AKI (acute kidney injury) -Continue to hydrate-UA checked and mild proteinuria noted- check pro/cr ratio -losartan discontinued on admission -Due to hypercalcemia, we'll do an SPEP and UPEP  Active Problems: Hypercalcemia -Chronic hypercalcemia with acute exacerbation -calcium 12/5 on admission- reviewed labs from last year- calcium levels were 10.5-11.4- she was maintained on calcium and vitamin D supplements as outpatient which I have discontinued -SPEP, UPEP, intact PTH and PTH related peptide -Continue to hydrate aggressively -May also paraneoplastic syndrome and relation to to nodule seen in lung which the patient never wanted worked up  Acute on chronic encephalopathy/ h/o paranoia with symptoms of seeing people in her house -He is been on Risperdal for paranoia -Acute encephalopathy is likely related to acute renal failure and hypercalcemia -once  acute encephalopathy resolves, will need a psych eval to see if she is competent at her baseline to make her own decisions  Mild hypernatremia  -Probably secondary to normal saline infusion-will change to half-normal saline  Hyperthyroidism -This is progressive since 1 year ago when lab work revealed hyperthyroidism  Ref. Range 11/20/2013 22:25 11/21/2013 04:28 12/15/2014 17:04  TSH Latest Ref Range: 0.350-4.500 uIU/mL 0.033 (L)  0.019 (L)  Free T4 Latest Ref Range: 0.80-1.80 ng/dL  1.93 (H) 2.19 (H)  T3, Free Latest Ref Range: 2.3-4.2 pg/mL  3.4   -Will start Methimazole    Lung nodule -If the patient is found to be mentally competent and does not want this worked up, no further scans will need to be done  Elevated Troponin - maybe due to renal failure- EKG non acute- no complaints of chest pain- no further work up    Acquired obstructive hydrocephalus -has a VP shunt which appears to be functioning appropriately based on recent MRI and CT scans    Essential hypertension, benign -Continue Lopressor-hold losartan due to renal failure  Code Status: Full code Family Communication: Daughter who is POA Disposition Plan: correct hypercalcemia, renal failure, hyperthyroidism DVT prophylaxis: Lovenox Consultants: Procedures:  Antibiotics: Anti-infectives    None      Objective: Filed Weights   12/24/14 2004  Weight: 55.339 kg (122 lb)    Intake/Output Summary (Last 24 hours) at 12/25/14 1517 Last data filed at 12/25/14 0300  Gross per 24 hour  Intake 1067.5 ml  Output      0 ml  Net 1067.5 ml     Vitals Filed Vitals:   12/25/14 0055 12/25/14 0100 12/25/14 0148 12/25/14 0525  BP: 189/83 172/82 161/109 147/52  Pulse: 84  88 66  Temp: 97.4 F (36.3 C)  97.7 F (36.5 C) 97.4 F (36.3 C)  TempSrc: Oral   Axillary  Resp: 17 17 18 16   Height:      Weight:      SpO2: 96%  99% 97%    Exam:  General:  Pt is alert, not in acute distress- confused to time and  place  HEENT: No icterus, No thrush, oral mucosa moist  Cardiovascular: regular rate and rhythm, S1/S2 No murmur  Respiratory: clear to auscultation bilaterally   Abdomen: Soft, +Bowel sounds, non tender, non distended, no guarding  MSK: No LE edema, cyanosis or clubbing  Data Reviewed: Basic Metabolic Panel:  Recent Labs Lab 12/24/14 2023 12/25/14 0600  NA 143 146*  K 3.9 3.6  CL 109 111  CO2 24 25  GLUCOSE 124* 103*  BUN 44* 44*  CREATININE 3.11* 2.81*  CALCIUM 12.5* 11.5*   Liver Function Tests:  Recent Labs Lab 12/24/14 2023  AST 43*  ALT 22  ALKPHOS 114  BILITOT 0.9  PROT 7.0  ALBUMIN 3.9   No results for input(s): LIPASE, AMYLASE in the last 168 hours. No results for input(s): AMMONIA in the last 168 hours. CBC:  Recent Labs Lab 12/24/14 2023 12/25/14 0600  WBC 13.1* 11.4*  HGB 14.7 14.0  HCT 45.3 43.2  MCV 89.5 91.3  PLT 236 228   Cardiac Enzymes:  Recent Labs Lab 12/24/14 2023  TROPONINI 0.05*   BNP (last 3 results) No results for input(s): BNP in the last 8760 hours.  ProBNP (last 3 results) No results for input(s): PROBNP in the last 8760 hours.  CBG: No results for input(s): GLUCAP in the last 168 hours.  Recent Results (from the past 240 hour(s))  Urine culture     Status: None   Collection Time: 12/15/14  5:05 PM  Result Value Ref Range Status   Colony Count 60,000 COLONIES/ML  Final   Organism ID, Bacteria Multiple bacterial morphotypes present, none  Final   Organism ID, Bacteria predominant. Suggest appropriate recollection if   Final   Organism ID, Bacteria clinically indicated.  Final  Urine culture     Status: None (Preliminary result)   Collection Time: 12/24/14  8:37 PM  Result Value Ref Range Status   Specimen Description URINE, CATHETERIZED  Final   Special Requests NONE  Final   Culture   Final    NO GROWTH < 24 HOURS Performed at [facility]    Report Status PENDING  Incomplete     Studies: Dg  Chest 2 View  12/24/2014   CLINICAL DATA:  Confusion for 2 weeks, worse today. Unsteady gait. Hypertension. Initial encounter.  EXAM: CHEST  2 VIEW  COMPARISON:  CT 11/21/2013.  Radiographs 11/20/2013.  FINDINGS: The heart size and mediastinal contours are stable with mild aortic tortuosity. There is stable mild atelectasis or scarring at the left lung base. The lungs are otherwise clear. Ventriculoperitoneal shunt catheter overlies the right chest. Superior endplate compression deformity at L1 appears grossly stable.  IMPRESSION: Stable chest without evidence of acute cardiopulmonary process.   Electronically Signed   By: Richardean Sale M.D.   On: 12/24/2014 21:11   Ct Head Wo Contrast  12/24/2014   CLINICAL DATA:  Altered mental status, hypertension  EXAM: CT HEAD WITHOUT CONTRAST  TECHNIQUE: Contiguous axial images were obtained from the base of the skull through the vertex without intravenous contrast.  COMPARISON:  Six/ 22/16  FINDINGS: No skull  fracture is noted. Again noted a right frontal ventriculostomy catheter with tip in left frontal horn. Stable cerebral atrophy. Again noted periventricular and patchy subcortical chronic white matter disease. Ventricular size is stable from prior exam. Stable small lacunar infarct bilateral thalamus. No acute cortical infarction. Stable vascular calcified lesion at the fourth ventricle.  No intracranial hemorrhage, mass effect or midline shift.  IMPRESSION: No acute intracranial abnormality. Stable right frontal ventriculostomy catheter position with tip in left frontal horn. Stable atrophy and chronic white matter disease. No definite acute cortical infarction.   Electronically Signed   By: Lahoma Crocker M.D.   On: 12/24/2014 21:18    Scheduled Meds:  Scheduled Meds: . enoxaparin (LOVENOX) injection  30 mg Subcutaneous Q24H  . methimazole  15 mg Oral TID  . metoprolol succinate  50 mg Oral Daily  . multivitamin with minerals  1 tablet Oral Daily  .  pravastatin  40 mg Oral Daily  . risperiDONE  0.5 mg Oral QHS  . sodium chloride  3 mL Intravenous Q12H   Continuous Infusions: . dextrose 5 % and 0.45% NaCl 125 mL/hr at 12/25/14 1332    Time spent on care of this patient: 35 min   [place], MD 12/25/2014, 3:17 PM  LOS: 1 day   [facility] Office  [phone] Pager - Text Page per [email or web address] If 7PM-7AM, please contact night-coverage [email or web address]

## 2014-12-26 DIAGNOSIS — F0390 Unspecified dementia without behavioral disturbance: Secondary | ICD-10-CM

## 2014-12-26 DIAGNOSIS — G9340 Encephalopathy, unspecified: Secondary | ICD-10-CM

## 2014-12-26 LAB — BASIC METABOLIC PANEL
Anion gap: 9 (ref 5–15)
BUN: 34 mg/dL — ABNORMAL HIGH (ref 6–20)
CALCIUM: 10.5 mg/dL — AB (ref 8.9–10.3)
CHLORIDE: 110 mmol/L (ref 101–111)
CO2: 24 mmol/L (ref 22–32)
Creatinine, Ser: 1.95 mg/dL — ABNORMAL HIGH (ref 0.44–1.00)
GFR calc non Af Amer: 23 mL/min — ABNORMAL LOW (ref >60)
GFR, EST AFRICAN AMERICAN: 27 mL/min — AB (ref >60)
Glucose, Bld: 92 mg/dL (ref 65–99)
Potassium: 3.1 mmol/L — ABNORMAL LOW (ref 3.5–5.1)
Sodium: 143 mmol/L (ref 135–145)

## 2014-12-26 LAB — URINE CULTURE: Culture: NO GROWTH

## 2014-12-26 LAB — PROTEIN ELECTROPHORESIS, SERUM
A/G Ratio: 1.1 (ref 0.7–1.7)
ALPHA-1-GLOBULIN: 0.2 g/dL (ref 0.0–0.4)
ALPHA-2-GLOBULIN: 0.8 g/dL (ref 0.4–1.0)
Albumin ELP: 3 g/dL (ref 2.9–4.4)
Beta Globulin: 0.9 g/dL (ref 0.7–1.3)
GLOBULIN, TOTAL: 2.8 g/dL (ref 2.2–3.9)
Gamma Globulin: 1 g/dL (ref 0.4–1.8)
Total Protein ELP: 5.8 g/dL — ABNORMAL LOW (ref 6.0–8.5)

## 2014-12-26 LAB — CALCITONIN: Calcitonin: <2 pg/mL (ref 0.0–5.0)

## 2014-12-26 LAB — PARATHYROID HORMONE, INTACT (NO CA): PTH: 39 pg/mL (ref 15–65)

## 2014-12-26 LAB — MAGNESIUM: MAGNESIUM: 1.8 mg/dL (ref 1.7–2.4)

## 2014-12-26 LAB — CALCIUM, IONIZED: Calcium, Ionized, Serum: 6.4 mg/dL — ABNORMAL HIGH (ref 4.5–5.6)

## 2014-12-26 MED ORDER — POTASSIUM CHLORIDE CRYS ER 20 MEQ PO TBCR
40.0000 meq | EXTENDED_RELEASE_TABLET | ORAL | Status: AC
  Administered 2014-12-26 (×2): 40 meq via ORAL
  Filled 2014-12-26 (×2): qty 2

## 2014-12-26 MED ORDER — DEXTROSE-NACL 5-0.45 % IV SOLN
INTRAVENOUS | Status: DC
  Administered 2014-12-26 – 2014-12-27 (×4): via INTRAVENOUS

## 2014-12-26 MED ORDER — DEXTROSE-NACL 5-0.45 % IV SOLN: INTRAVENOUS | Status: DC

## 2014-12-26 NOTE — Progress Notes (Signed)
[facility] Progress Note   [name] BJS:[id number] DOB: 07-16-1934 DOA: 12/24/2014 PCP: Reginia Forts, MD  Brief narrative: [name] is a 80 y.o. female past medical history of coronary artery disease, hypertension, hyperlipidemia, hydrocephalus status post VP shunt last year, hypercalcemia, lung nodule which the patient does not want worked up and paranoia. The patient's daughter gives a history today. She states that the patient has been increasingly confused for the past 2 weeks. An MRI was performed by her PCP and no acute issues were seen on this MRI. The daughter has been checking on her mother regularly over this 2 wk period and is making sure that she is taking her medications appropriately. She brought her into the hospital when she was found half naked out in her yard. She was found to have acute renal failure and hypercalcemia.   Subjective: Without complaints today. Specifically no nausea vomiting, abdominal pain, chest pain, shortness of breath or headache.  Assessment/Plan: Principal Problem:   AKI (acute kidney injury) -Continue to hydrate-UA checked and mild proteinuria noted- checked pro/cr ratio does not reveal significant proteinuria  -losartan discontinued on admission -Due to hypercalcemia, we'll do an SPEP and UPEP  Active Problems: Hypercalcemia -Chronic hypercalcemia with acute exacerbation -calcium 12/5 on admission- reviewed labs from last year- calcium levels were 10.5-11.4- she was maintained on calcium and vitamin D supplements as outpatient which I have discontinued -SPEP, UPEP, intact PTH and PTH related peptide (paraneoplastic syndrome in relation to to nodule seen in lung which the patient never wanted worked up) -Continue to hydrate aggressively - Calcium improving   Hypokalemia - replacing   Acute encephalopathy/ h/o paranoia with symptoms of seeing people in her house -He is been on Risperdal for paranoia (likely dementia)   - Acute encephalopathy is likely related to acute renal failure and hypercalcemia - check B12, RPR as well -once acute encephalopathy resolves, will need a psych eval to see if she is competent at her baseline to make her own decisions  Mild hypernatremia  -Probably secondary to normal saline infusion- changed to half-normal saline with improvement  Hyperthyroidism -This is progressive since 1 year ago when lab work revealed hyperthyroidism  Ref. Range 11/20/2013 22:25 11/21/2013 04:28 12/15/2014 17:04  TSH Latest Ref Range: 0.350-4.500 uIU/mL 0.033 (L)  0.019 (L)  Free T4 Latest Ref Range: 0.80-1.80 ng/dL  1.93 (H) 2.19 (H)  T3, Free Latest Ref Range: 2.3-4.2 pg/mL  3.4   - started Methimazole    Lung nodule -did not want this worked up in the past- If the patient is found to be mentally competent and does not want this worked up, no further scans will need to be done  Elevated Troponin - maybe due to renal failure- EKG non acute- no complaints of chest pain- no further work up    Acquired obstructive hydrocephalus -has a VP shunt which appears to be functioning appropriately based on recent MRI and CT scans    Essential hypertension, benign -Continue Lopressor-hold losartan due to renal failure  Code Status: Full code Family Communication: Daughter who is POA Disposition Plan: correct hypercalcemia, renal failure, hyperthyroidism DVT prophylaxis: Lovenox Consultants: Procedures:  Antibiotics: Anti-infectives    None      Objective: Filed Weights   12/24/14 2004  Weight: 55.339 kg (122 lb)    Intake/Output Summary (Last 24 hours) at 12/26/14 1438 Last data filed at 12/25/14 1845  Gross per 24 hour  Intake 608.33 ml  Output      0  ml  Net 608.33 ml     Vitals Filed Vitals:   12/25/14 1541 12/25/14 2125 12/26/14 0216 12/26/14 0519  BP: 159/76 183/77 178/72 134/77  Pulse: 70 74 73 66  Temp: 97.4 F (36.3 C) 98.3 F (36.8 C) 98.5 F (36.9 C) 97.3 F (36.3 C)   TempSrc: Axillary Oral Oral Oral  Resp: 18 17 16 16   Height:      Weight:      SpO2: 98% 95% 96% 97%    Exam:  General:  Pt is alert, not in acute distress- confused to time and place  HEENT: No icterus, No thrush, oral mucosa moist  Cardiovascular: regular rate and rhythm, S1/S2 No murmur  Respiratory: clear to auscultation bilaterally   Abdomen: Soft, +Bowel sounds, non tender, non distended, no guarding  MSK: No LE edema, cyanosis or clubbing  Data Reviewed: Basic Metabolic Panel:  Recent Labs Lab 12/24/14 2023 12/25/14 0600 12/26/14 0533  NA 143 146* 143  K 3.9 3.6 3.1*  CL 109 111 110  CO2 24 25 24   GLUCOSE 124* 103* 92  BUN 44* 44* 34*  CREATININE 3.11* 2.81* 1.95*  CALCIUM 12.5* 11.5* 10.5*  MG  --   --  1.8   Liver Function Tests:  Recent Labs Lab 12/24/14 2023  AST 43*  ALT 22  ALKPHOS 114  BILITOT 0.9  PROT 7.0  ALBUMIN 3.9   No results for input(s): LIPASE, AMYLASE in the last 168 hours. No results for input(s): AMMONIA in the last 168 hours. CBC:  Recent Labs Lab 12/24/14 2023 12/25/14 0600  WBC 13.1* 11.4*  HGB 14.7 14.0  HCT 45.3 43.2  MCV 89.5 91.3  PLT 236 228   Cardiac Enzymes:  Recent Labs Lab 12/24/14 2023  TROPONINI 0.05*   BNP (last 3 results) No results for input(s): BNP in the last 8760 hours.  ProBNP (last 3 results) No results for input(s): PROBNP in the last 8760 hours.  CBG: No results for input(s): GLUCAP in the last 168 hours.  Recent Results (from the past 240 hour(s))  Urine culture     Status: None   Collection Time: 12/24/14  8:37 PM  Result Value Ref Range Status   Specimen Description URINE, CATHETERIZED  Final   Special Requests NONE  Final   Culture   Final    NO GROWTH 2 DAYS Performed at [facility]    Report Status 12/26/2014 FINAL  Final     Studies: Dg Chest 2 View  12/24/2014   CLINICAL DATA:  Confusion for 2 weeks, worse today. Unsteady gait. Hypertension. Initial  encounter.  EXAM: CHEST  2 VIEW  COMPARISON:  CT 11/21/2013.  Radiographs 11/20/2013.  FINDINGS: The heart size and mediastinal contours are stable with mild aortic tortuosity. There is stable mild atelectasis or scarring at the left lung base. The lungs are otherwise clear. Ventriculoperitoneal shunt catheter overlies the right chest. Superior endplate compression deformity at L1 appears grossly stable.  IMPRESSION: Stable chest without evidence of acute cardiopulmonary process.   Electronically Signed   By: Richardean Sale M.D.   On: 12/24/2014 21:11   Ct Head Wo Contrast  12/24/2014   CLINICAL DATA:  Altered mental status, hypertension  EXAM: CT HEAD WITHOUT CONTRAST  TECHNIQUE: Contiguous axial images were obtained from the base of the skull through the vertex without intravenous contrast.  COMPARISON:  Six/ 22/16  FINDINGS: No skull fracture is noted. Again noted a right frontal ventriculostomy catheter with tip in  left frontal horn. Stable cerebral atrophy. Again noted periventricular and patchy subcortical chronic white matter disease. Ventricular size is stable from prior exam. Stable small lacunar infarct bilateral thalamus. No acute cortical infarction. Stable vascular calcified lesion at the fourth ventricle.  No intracranial hemorrhage, mass effect or midline shift.  IMPRESSION: No acute intracranial abnormality. Stable right frontal ventriculostomy catheter position with tip in left frontal horn. Stable atrophy and chronic white matter disease. No definite acute cortical infarction.   Electronically Signed   By: Lahoma Crocker M.D.   On: 12/24/2014 21:18    Scheduled Meds:  Scheduled Meds: . enoxaparin (LOVENOX) injection  30 mg Subcutaneous Q24H  . methimazole  15 mg Oral TID  . metoprolol succinate  50 mg Oral Daily  . multivitamin with minerals  1 tablet Oral Daily  . pravastatin  40 mg Oral Daily  . risperiDONE  0.5 mg Oral QHS  . sodium chloride  3 mL Intravenous Q12H   Continuous  Infusions: . dextrose 5 % and 0.45% NaCl 125 mL/hr at 12/26/14 0840    Time spent on care of this patient: 63 min   [place], MD 12/26/2014, 2:38 PM  LOS: 2 days   [facility] Office  [phone] Pager - Text Page per [email or web address] If 7PM-7AM, please contact night-coverage [email or web address]

## 2014-12-27 DIAGNOSIS — I10 Essential (primary) hypertension: Secondary | ICD-10-CM

## 2014-12-27 LAB — TROPONIN I
Troponin I: 0.04 ng/mL — ABNORMAL HIGH (ref <0.031)
Troponin I: 0.03 ng/mL (ref <0.031)

## 2014-12-27 LAB — BASIC METABOLIC PANEL
ANION GAP: 15 (ref 5–15)
BUN: 22 mg/dL — ABNORMAL HIGH (ref 6–20)
CALCIUM: 10.9 mg/dL — AB (ref 8.9–10.3)
CO2: 18 mmol/L — ABNORMAL LOW (ref 22–32)
Chloride: 111 mmol/L (ref 101–111)
Creatinine, Ser: 1.4 mg/dL — ABNORMAL HIGH (ref 0.44–1.00)
GFR calc non Af Amer: 35 mL/min — ABNORMAL LOW (ref >60)
GFR, EST AFRICAN AMERICAN: 41 mL/min — AB (ref >60)
Glucose, Bld: 153 mg/dL — ABNORMAL HIGH (ref 65–99)
Potassium: 3.8 mmol/L (ref 3.5–5.1)
Sodium: 144 mmol/L (ref 135–145)

## 2014-12-27 LAB — VITAMIN B12: VITAMIN B 12: 326 pg/mL (ref 180–914)

## 2014-12-27 MED ORDER — HYDRALAZINE HCL 20 MG/ML IJ SOLN: 5.0000 mg | Freq: Once | INTRAMUSCULAR | Status: DC

## 2014-12-27 MED ORDER — RISPERIDONE 0.5 MG PO TABS
0.5000 mg | ORAL_TABLET | Freq: Every day | ORAL | Status: DC
  Administered 2014-12-27 – 2014-12-29 (×3): 0.5 mg via ORAL
  Filled 2014-12-27 (×4): qty 1

## 2014-12-27 MED ORDER — SODIUM CHLORIDE 0.9 % IV SOLN: 90.0000 mg | Freq: Once | INTRAVENOUS | Status: DC

## 2014-12-27 MED ORDER — FUROSEMIDE 10 MG/ML IJ SOLN
40.0000 mg | Freq: Every day | INTRAMUSCULAR | Status: DC
  Administered 2014-12-27 – 2014-12-28 (×2): 40 mg via INTRAVENOUS
  Filled 2014-12-27 (×2): qty 4

## 2014-12-27 MED ORDER — ZOLEDRONIC ACID 4 MG/5ML IV CONC: 4.0000 mg | Freq: Once | INTRAVENOUS | Status: DC

## 2014-12-27 MED ORDER — SODIUM CHLORIDE 0.9 % IV SOLN
90.0000 mg | Freq: Once | INTRAVENOUS | Status: AC
  Administered 2014-12-27: 90 mg via INTRAVENOUS
  Filled 2014-12-27: qty 10

## 2014-12-27 MED ORDER — HYDRALAZINE HCL 20 MG/ML IJ SOLN: 20.0000 mg | Freq: Four times a day (QID) | INTRAMUSCULAR | Status: DC | PRN

## 2014-12-27 MED ORDER — HYDRALAZINE HCL 20 MG/ML IJ SOLN
10.0000 mg | Freq: Four times a day (QID) | INTRAMUSCULAR | Status: DC | PRN
  Administered 2014-12-27: 10 mg via INTRAVENOUS
  Filled 2014-12-27: qty 1

## 2014-12-27 MED ORDER — HYDRALAZINE HCL 20 MG/ML IJ SOLN
10.0000 mg | Freq: Once | INTRAMUSCULAR | Status: AC
  Administered 2014-12-27: 10 mg via INTRAVENOUS
  Filled 2014-12-27: qty 1

## 2014-12-27 NOTE — Progress Notes (Signed)
[facility] progress note. Chief complaint. Chest pain. History of present illness. This 80 year old female in hospital with acute kidney injury, hypercalcemia, hypokalemia, acute encephalopathy with paranoid ideation, elevated troponin without EKG change. The patient complained of chest pain to nursing staff this morning. 12-lead EKG was obtained and this does not look significantly different than previous EKGs and does not look acutely ischemic. I came up to see the patient at bedside and at this point she denies any pain. She is unfortunately a poor historian given her dementia. Physical exam. Vital signs. Temperature 98.2, pulse 107, respiration 18, blood pressure 190/78. O2 sats 98%. General appearance. Frail elderly female who is alert and in no distress. Cardiac. Rate and rhythm regular with occasional irregular beat. No jugular venous distention or significant edema. Lungs. Breath sounds are clear. Abdomen. Soft and obese with positive bowel sounds. Mild diffuse pain with palpation. Impression/plan. Problem #1. Chest pain. EKG obtained and does not look acute ischemic. The patient denies any pain at the time I arrived at bedside. She has no recollection of prior reports of chest pain and is an overall poor historian given her dementia. Nonetheless I will check troponin now and then every 6 hours for total of 3 sets. We'll repeat an EKG in 6 hours to evaluate for any changes.

## 2014-12-27 NOTE — Progress Notes (Signed)
[facility] Progress Note   [name] PYK:[id number] DOB: 1935-06-14 DOA: 12/24/2014 PCP: Reginia Forts, MD  Brief narrative: [name] is a 80 y.o. female past medical history of coronary artery disease, hypertension, hyperlipidemia, hydrocephalus status post VP shunt last year, hypercalcemia, lung nodule which the patient does not want worked up and paranoia. The patient's daughter gives a history today. She states that the patient has been increasingly confused for the past 2 weeks. An MRI was performed by her PCP and no acute issues were seen on this MRI. The daughter has been checking on her mother regularly over this 2 wk period and is making sure that she is taking her medications appropriately. She brought her into the hospital when she was found half naked out in her yard. She was found to have acute renal failure and hypercalcemia.   Subjective: He was quite restless this morning and complained of chest pain. She received a dose of IV Dilaudid. Currently does not appear to be any pain. Is quite calm. Remains quite confused. He is not able to recognize her daughter this morning and is dipping her toast and her juice.  Assessment/Plan: Principal Problem:   AKI (acute kidney injury) -Improving daily -Continue to hydrate-UA checked and mild proteinuria noted- checked pro/cr ratio does not reveal significant proteinuria  -losartan discontinued on admission - SPEP negative- UPEP pending  Active Problems: Hypercalcemia -Chronic hypercalcemia with acute exacerbation -calcium 12.5 on admission- reviewed labs from last year- calcium levels were 10.5-11.4- she was maintained on calcium and vitamin D supplements as outpatient which I have discontinued -SPEP negative - UPEP pending - intact PTH is normal  - PTH related peptide pending (paraneoplastic syndrome in relation to to nodule seen in lung which the patient never wanted worked up) -Has been hydrated  aggressively-syndrome improved from 12.5-10 but not improving further -We'll start Lasix IV  and give a dose of pamidronate  Chest pain -Occurring early this morning-patient was quite restless tachycardic and hypertensive with this pain-Resolved with Dilaudid-first troponin mildly elevated-no troponin was elevated on 6/29 as well but as she was not having any pain, it was not pursued or repeated- - EKG unrevealing-continue to check 2 more sets of troponin  Hypokalemia - replaced  Acute encephalopathy/ h/o paranoia with symptoms of seeing people in her house -He is been on Risperdal for paranoia (likely dementia)  - Acute encephalopathy is likely related to acute renal failure and hypercalcemia - check B12, RPR as well -once acute encephalopathy resolves, will need a neuropsych eval to see if she is competent at her baseline to make her own decisions  Mild hypernatremia  -Probably secondary to normal saline infusion- changed to half-normal saline with improvement  Hyperthyroidism -This is progressive since 1 year ago when lab work revealed hyperthyroidism  Ref. Range 11/20/2013 22:25 11/21/2013 04:28 12/15/2014 17:04  TSH Latest Ref Range: 0.350-4.500 uIU/mL 0.033 (L)  0.019 (L)  Free T4 Latest Ref Range: 0.80-1.80 ng/dL  1.93 (H) 2.19 (H)  T3, Free Latest Ref Range: 2.3-4.2 pg/mL  3.4   - started Methimazole    Lung nodule -did not want this worked up in the past- If the patient is found to be mentally competent and does not want this worked up, no further scans will need to be done    Acquired obstructive hydrocephalus -has a VP shunt which appears to be functioning appropriately based on recent MRI and CT scans    Essential hypertension, benign -Continue Lopressor-hold losartan due to  renal failure  Code Status: Full code Family Communication: Daughter who is POA Disposition Plan: correct hypercalcemia, renal failure, hyperthyroidism- I'll need to go to SNF versus assisted  living DVT prophylaxis: Lovenox Consultants: Procedures:  Antibiotics: Anti-infectives    None      Objective: Filed Weights   12/24/14 2004  Weight: 55.339 kg (122 lb)    Intake/Output Summary (Last 24 hours) at 12/27/14 1152 Last data filed at 12/26/14 1815  Gross per 24 hour  Intake   1292 ml  Output      0 ml  Net   1292 ml     Vitals Filed Vitals:   12/27/14 0453 12/27/14 0510 12/27/14 0726 12/27/14 1100  BP:  190/78 136/70 146/67  Pulse: 115 107 110 96  Temp:    97.9 F (36.6 C)  TempSrc:    Axillary  Resp:    18  Height:      Weight:      SpO2: 98%  96% 96%    Exam:  General:  Pt is alert, not in acute distress- confused to time and place  HEENT: No icterus, No thrush, oral mucosa moist  Cardiovascular: regular rate and rhythm, S1/S2 No murmur  Respiratory: clear to auscultation bilaterally   Abdomen: Soft, +Bowel sounds, non tender, non distended, no guarding  MSK: No LE edema, cyanosis or clubbing  Data Reviewed: Basic Metabolic Panel:  Recent Labs Lab 12/24/14 2023 12/25/14 0600 12/26/14 0533 12/27/14 0630  NA 143 146* 143 144  K 3.9 3.6 3.1* 3.8  CL 109 111 110 111  CO2 24 25 24  18*  GLUCOSE 124* 103* 92 153*  BUN 44* 44* 34* 22*  CREATININE 3.11* 2.81* 1.95* 1.40*  CALCIUM 12.5* 11.5* 10.5* 10.9*  MG  --   --  1.8  --    Liver Function Tests:  Recent Labs Lab 12/24/14 2023  AST 43*  ALT 22  ALKPHOS 114  BILITOT 0.9  PROT 7.0  ALBUMIN 3.9   No results for input(s): LIPASE, AMYLASE in the last 168 hours. No results for input(s): AMMONIA in the last 168 hours. CBC:  Recent Labs Lab 12/24/14 2023 12/25/14 0600  WBC 13.1* 11.4*  HGB 14.7 14.0  HCT 45.3 43.2  MCV 89.5 91.3  PLT 236 228   Cardiac Enzymes:  Recent Labs Lab 12/24/14 2023 12/27/14 0630  TROPONINI 0.05* 0.04*   BNP (last 3 results) No results for input(s): BNP in the last 8760 hours.  ProBNP (last 3 results) No results for input(s): PROBNP  in the last 8760 hours.  CBG: No results for input(s): GLUCAP in the last 168 hours.  Recent Results (from the past 240 hour(s))  Urine culture     Status: None   Collection Time: 12/24/14  8:37 PM  Result Value Ref Range Status   Specimen Description URINE, CATHETERIZED  Final   Special Requests NONE  Final   Culture   Final    NO GROWTH 2 DAYS Performed at [facility]    Report Status 12/26/2014 FINAL  Final     Studies: No results found.  Scheduled Meds:  Scheduled Meds: . enoxaparin (LOVENOX) injection  30 mg Subcutaneous Q24H  . furosemide  40 mg Intravenous Daily  . methimazole  15 mg Oral TID  . metoprolol succinate  50 mg Oral Daily  . multivitamin with minerals  1 tablet Oral Daily  . pamidronate  90 mg Intravenous Once  . pravastatin  40 mg Oral Daily  .  sodium chloride  3 mL Intravenous Q12H   Continuous Infusions: . dextrose 5 % and 0.45% NaCl 125 mL/hr at 12/27/14 1032    Time spent on care of this patient: 35 min   [place], MD 12/27/2014, 11:52 AM  LOS: 3 days   [facility] Office  [phone] Pager - Text Page per [email or web address] If 7PM-7AM, please contact night-coverage [email or web address]

## 2014-12-27 NOTE — Progress Notes (Signed)
Pt continues to c/o chest pain. BP better and pt remains ST of 110 to 120 on monitor. Text MD to alert at this time. Pt lying in bed. Incontinence continues. Pt confused to time, situation, person. Alert to place only this morning.  Talking and helping to turn self in bed. Will continue to monitor closely. Dilaudid IV given for pain with relief.

## 2014-12-27 NOTE — Progress Notes (Signed)
Received call from MD -- will continue to monitor pt.

## 2014-12-28 ENCOUNTER — Inpatient Hospital Stay (HOSPITAL_COMMUNITY): Payer: Medicare Other

## 2014-12-28 LAB — BASIC METABOLIC PANEL
Anion gap: 10 (ref 5–15)
BUN: 23 mg/dL — ABNORMAL HIGH (ref 6–20)
CO2: 23 mmol/L (ref 22–32)
CREATININE: 1.27 mg/dL — AB (ref 0.44–1.00)
Calcium: 9.9 mg/dL (ref 8.9–10.3)
Chloride: 107 mmol/L (ref 101–111)
GFR calc non Af Amer: 39 mL/min — ABNORMAL LOW (ref >60)
GFR, EST AFRICAN AMERICAN: 46 mL/min — AB (ref >60)
GLUCOSE: 122 mg/dL — AB (ref 65–99)
Potassium: 3.7 mmol/L (ref 3.5–5.1)
SODIUM: 140 mmol/L (ref 135–145)

## 2014-12-28 LAB — CBC
HCT: 42 % (ref 36.0–46.0)
Hemoglobin: 13.5 g/dL (ref 12.0–15.0)
MCH: 28.8 pg (ref 26.0–34.0)
MCHC: 32.1 g/dL (ref 30.0–36.0)
MCV: 89.6 fL (ref 78.0–100.0)
Platelets: 212 10*3/uL (ref 150–400)
RBC: 4.69 MIL/uL (ref 3.87–5.11)
RDW: 13.6 % (ref 11.5–15.5)
WBC: 8.3 10*3/uL (ref 4.0–10.5)

## 2014-12-28 LAB — RPR: RPR Ser Ql: NONREACTIVE

## 2014-12-28 LAB — HIV ANTIBODY (ROUTINE TESTING W REFLEX): HIV Screen 4th Generation wRfx: NONREACTIVE

## 2014-12-28 MED ORDER — AMLODIPINE BESYLATE 5 MG PO TABS
5.0000 mg | ORAL_TABLET | Freq: Every day | ORAL | Status: DC
  Administered 2014-12-28 – 2014-12-29 (×2): 5 mg via ORAL
  Filled 2014-12-28 (×3): qty 1

## 2014-12-28 MED ORDER — MORPHINE SULFATE (CONCENTRATE) 10 MG/0.5ML PO SOLN: 10.0000 mg | ORAL | Status: DC | PRN

## 2014-12-28 MED ORDER — LIDOCAINE 5 % EX PTCH
2.0000 | MEDICATED_PATCH | Freq: Every day | CUTANEOUS | Status: DC
  Administered 2014-12-28 – 2014-12-29 (×2): 2 via TRANSDERMAL
  Filled 2014-12-28 (×3): qty 2

## 2014-12-28 NOTE — Progress Notes (Signed)
OT Cancellation Note  Patient Details Name: [name] MRN: [id number] DOB: 04-22-1935   Cancelled Treatment:    Reason Eval/Treat Not Completed: OT order received. Spoke with PT who reported patient too sleepy to participate. OT will follow up for evaluation at later time. Jatavis Malek A 12/28/2014, 12:10 PM

## 2014-12-28 NOTE — Progress Notes (Signed)
[facility] PROGRESS NOTE  KALEYA DOUSE GMW:[id number] DOB: 1934-09-05 DOA: 12/24/2014 PCP: Reginia Forts, MD  Brief Summary  [name] is a 80 y.o. female past medical history of coronary artery disease, hypertension, hyperlipidemia, hydrocephalus status post VP shunt last year, hypercalcemia, lung nodule which the patient does not want worked up and paranoia. The patient's daughter gives a history today. She states that the patient has been increasingly confused for the past 2 weeks. An MRI was performed by her PCP and no acute issues were seen on this MRI. The daughter has been checking on her mother regularly over this 2 wk period and is making sure that she is taking her medications appropriately. She brought her into the hospital when she was found half naked out in her yard. She was found to have acute renal failure and hypercalcemia.  Assessment/Plan  AKI (acute kidney injury) -  Improving with IVF - Continue to hydrate-UA checked and mild proteinuria noted- checked pro/cr ratio does not reveal significant proteinuria  -  Continue to hold losartan  Hypercalcemia, chronic hypercalcemia with acute exacerbation -calcium 12.5 on admission- reviewed labs from last year- calcium levels were 10.5-11.4- she was maintained on calcium and vitamin D supplements as outpatient which I have discontinued - SPEP negative- UPEP/IFE pending - intact PTH is normal  - PTH related peptide pending  - given lasix and pamidronate on 7/2 with rapid resolution of hypercalcemia  Chest pain, atypical bilateral lateral chest wall pain  -Occurring early this morning-patient was quite restless tachycardic and hypertensive with this pain-Resolved with Dilaudid-first troponin mildly elevated-no troponin was elevated on 6/29 as well but as she was not having any pain, it was not pursued or repeated- - EKG unrevealing -  troponins negative  Acute encephalopathy superimposed on underlying  dementia with h/o paranoia and hallucinations.   - continue Risperdal - Acute encephalopathy is likely related to acute renal failure and hypercalcemia and pain medication - check B12 326, RPR NR - once acute encephalopathy resolves, will need a neuropsych eval to see if she is competent at her baseline to make her own decisions -  Repeat head CT on 7/3 for possible right hemineglect:  stable  Hypokalemia, resolved with supplementation  Hyperthyroidism -This is progressive since 1 year ago when lab work revealed hyperthyroidism - started Methimazole   Lung nodule -did not want this worked up in the past- If the patient is found to be mentally competent and does not want this worked up, no further scans will need to be done   Acquired obstructive hydrocephalus -has a VP shunt which appears to be functioning appropriately based on recent MRI and CT scans   Essential hypertension, benign -Continue Lopressor-hold losartan due to renal failure  Diet:  Dysphagia 3 with thin  Access:  PIV IVF:  off Proph:  lovenox  Code Status: full Family Communication: patient and her daughter Disposition Plan: pending PT/OT/SLP evaluations, further eval for hemineglect   Consultants:  None  Procedures:  none  Antibiotics:  none   HPI/Subjective:  Having intermittent grabbing pains on the lateral chest wall bilaterally.  Otherwise denies headache, sore throat, sob.  Confused.    Objective: Filed Vitals:   12/27/14 1844 12/27/14 2156 12/28/14 0703 12/28/14 0900  BP: 165/72 138/81 145/80 119/76  Pulse:  85 86 91  Temp:  98.1 F (36.7 C) 98.2 F (36.8 C) 97.8 F (36.6 C)  TempSrc:  Oral Oral Axillary  Resp:  16 16 18   Height:  Weight:      SpO2:  98% 99% 97%    Intake/Output Summary (Last 24 hours) at 12/28/14 1423 Last data filed at 12/27/14 1803  Gross per 24 hour  Intake      0 ml  Output      0 ml  Net      0 ml   Filed Weights   12/24/14 2004  Weight:  55.339 kg (122 lb)   Body mass index is 20.93 kg/(m^2).  Exam:   General: Pt is alert, not in acute distress, unable to tell me her middle name today.  Confused about time and place.  Difficulty   HEENT: No icterus, No thrush, oral mucosa moist  Cardiovascular: regular rate and rhythm, S1/S2 No murmur  Respiratory:  Rhonchorous, no wheezes or rales   Abdomen: Soft, +Bowel sounds, non tender, non distended, no guarding  MSK: No LE edema, cyanosis or clubbing  Data Reviewed: Basic Metabolic Panel:  Recent Labs Lab 12/24/14 2023 12/25/14 0600 12/26/14 0533 12/27/14 0630 12/28/14 0558  NA 143 146* 143 144 140  K 3.9 3.6 3.1* 3.8 3.7  CL 109 111 110 111 107  CO2 24 25 24  18* 23  GLUCOSE 124* 103* 92 153* 122*  BUN 44* 44* 34* 22* 23*  CREATININE 3.11* 2.81* 1.95* 1.40* 1.27*  CALCIUM 12.5* 11.5* 10.5* 10.9* 9.9  MG  --   --  1.8  --   --    Liver Function Tests:  Recent Labs Lab 12/24/14 2023  AST 43*  ALT 22  ALKPHOS 114  BILITOT 0.9  PROT 7.0  ALBUMIN 3.9   No results for input(s): LIPASE, AMYLASE in the last 168 hours. No results for input(s): AMMONIA in the last 168 hours. CBC:  Recent Labs Lab 12/24/14 2023 12/25/14 0600 12/28/14 0558  WBC 13.1* 11.4* 8.3  HGB 14.7 14.0 13.5  HCT 45.3 43.2 42.0  MCV 89.5 91.3 89.6  PLT 236 228 212    Recent Results (from the past 240 hour(s))  Urine culture     Status: None   Collection Time: 12/24/14  8:37 PM  Result Value Ref Range Status   Specimen Description URINE, CATHETERIZED  Final   Special Requests NONE  Final   Culture   Final    NO GROWTH 2 DAYS Performed at [facility]    Report Status 12/26/2014 FINAL  Final     Studies: Ct Head Wo Contrast  12/28/2014   CLINICAL DATA:  Hydrocephalus, status post VP shunt last year. Hypercalcemia. Confusion.  EXAM: CT HEAD WITHOUT CONTRAST  TECHNIQUE: Contiguous axial images were obtained from the base of the skull through the vertex without  intravenous contrast.  COMPARISON:  12/24/2014 head CT and 12/17/2014 brain MR.  FINDINGS: Sinuses/Soft tissues: Minimal mucosal thickening of the right sphenoid sinus. Other paranasal sinuses and mastoid air cells clear.  Intracranial: VP shunt catheter from right frontal approach terminates in the left lateral ventricle, similar.  Moderate low density in the periventricular white matter likely related to small vessel disease. Similar mild ventriculomegaly, felt to be secondary to advanced cerebral and cerebellar atrophy. Remote lacunar infarct in the left side of the thalamus on image 15. A smaller remote lacune is suspected in the right thalamus.  Calcified lesion in the fourth ventricle is grossly similar at 1.6 cm. No acute infarct, hemorrhage, intra-axial, or extra-axial fluid collection.  IMPRESSION: 1. No significant change since 12/17/2014. 2. Fourth ventricular lesion, as detailed on 12/17/2014 MRI. 3.  VP shunt catheter in place with similar mild ventriculomegaly, felt to be secondary to cerebral atrophy. 4. Moderate small vessel ischemic change.   Electronically Signed   By: Abigail Miyamoto M.D.   On: 12/28/2014 10:52   Dg Chest Port 1 View  12/28/2014   CLINICAL DATA:  Confusion.  Chest rales.  EXAM: PORTABLE CHEST - 1 VIEW  COMPARISON:  12/24/2014  FINDINGS: Right-sided VP shunt catheter. Surgical clips in the right upper quadrant. Remote left humeral head trauma suspected. Midline trachea. Mild cardiomegaly with a tortuous thoracic aorta. No pleural effusion or pneumothorax. No congestive failure. Mild atelectasis or scarring at both lung bases.  IMPRESSION: No acute cardiopulmonary disease.  Cardiomegaly without congestive failure.  Aortic atherosclerosis.   Electronically Signed   By: Abigail Miyamoto M.D.   On: 12/28/2014 11:12    Scheduled Meds: . amLODipine  5 mg Oral Daily  . enoxaparin (LOVENOX) injection  30 mg Subcutaneous Q24H  . furosemide  40 mg Intravenous Daily  . lidocaine  2 patch  Transdermal Daily  . methimazole  15 mg Oral TID  . metoprolol succinate  50 mg Oral Daily  . multivitamin with minerals  1 tablet Oral Daily  . pravastatin  40 mg Oral Daily  . risperiDONE  0.5 mg Oral QHS  . sodium chloride  3 mL Intravenous Q12H   Continuous Infusions:   Principal Problem:   AKI (acute kidney injury) Active Problems:   CAD (coronary artery disease)   Hypercalcemia   Hyperlipidemia   Lung nodule   Acquired obstructive hydrocephalus   Acute encephalopathy   Dementia   Essential hypertension, benign    Time spent: 30 min    Betsey Sossamon, [facility] Pager [phone]. If 7PM-7AM, please contact night-coverage at [email or web address], password [facility] 12/28/2014, 2:23 PM  LOS: 4 days

## 2014-12-28 NOTE — Evaluation (Addendum)
Clinical/Bedside Swallow Evaluation Patient Details  Name: [name] MRN: [id number] Date of Birth: [birth year]  Today's Date: 12/28/2014 Time: SLP Start Time (ACUTE ONLY): 1435 SLP Stop Time (ACUTE ONLY): 1458 SLP Time Calculation (min) (ACUTE ONLY): 23 min  Past Medical History:  Past Medical History  Diagnosis Date  . Hyperlipidemia   . Essential hypertension, benign   . CAD (coronary artery disease)   . Osteoporosis   . Hypercalcemia   . Vitamin D deficiency   . Ovarian tumor   . Breast tumor   . MI (myocardial infarction) July 1995    Treated with LAD PCI- Dr Adora Fridge  . Cataract   . Complication of anesthesia   . PONV (postoperative nausea and vomiting)   . Lung tumor     recently found approx 25 days ago-Kristen Tamala Julian, MD   Past Surgical History:  Past Surgical History  Procedure Laterality Date  . Angioplasty    . Hip pinning    . Breast lumpectomy    . Abdominal hysterectomy    . Cholecystectomy    . Fracture surgery  Nov 2003    L distal radius fx- Dr. Frederik Pear  . Eye surgery      Cataract removal  . Cardiac catheterization      1990's  . Ventriculoperitoneal shunt Right 12/20/2013    Procedure: SHUNT INSERTION VENTRICULAR-PERITONEAL;  Surgeon: Winfield Cunas, MD;  Location: [facility];  Service: Neurosurgery;  Laterality: Right;  VP shunt insertion  . Doppler echocardiography  2010   HPI:  [name] is a 80 y.o. female past medical history of coronary artery disease, hypertension, hyperlipidemia, hydrocephalus status post VP shunt last year, hypercalcemia, lung nodule which the patient does not want worked up, and paranoia, admitted with increasing confusion x 2 weeks. Dx with AKI, hypercalcemia, hyperkalemia, acute encephalopathy, mild hypernatremia. CXR and head CT negative.    Assessment / Plan / Recommendation Clinical Impression  Patient presents with a functional oropharyngeal swallow without overt indication of aspiration. Oral  holding and delayed oral transit noted with regular texture solids only, suspected related to AMS. Otherwise, timely oral clearance noted. Daughter reports no swallowing difficulty prior to onset of acute confusion. Receommend diet downgrade to soft solids to maximize po intake for maximal recovery. Daughter in agreement. Prognosis for ability to advance solids good with improved mentation. SLP will f/u.    Patient in with nursing following evaluation for IV adjustments. Unable to complete cog eval. Will f/u 7/4.    Aspiration Risk  Mild    Diet Recommendation Dysphagia 3 (Mech soft);Thin   Medication Administration: Whole meds with liquid Compensations: Slow rate;Small sips/bites;Check for pocketing    Other  Recommendations Oral Care Recommendations: Oral care BID      Frequency and Duration min 2x/week  1 week   Pertinent Vitals/Pain n/a     Swallow Study    General Other Pertinent Information: [name] is a 80 y.o. female past medical history of coronary artery disease, hypertension, hyperlipidemia, hydrocephalus status post VP shunt last year, hypercalcemia, lung nodule which the patient does not want worked up, and paranoia, admitted with increasing confusion x 2 weeks. Dx with AKI, hypercalcemia, hyperkalemia, acute encephalopathy, mild hypernatremia. CXR and head CT negative.  Type of Study: Bedside swallow evaluation Previous Swallow Assessment: none Diet Prior to this Study: Regular;Thin liquids Temperature Spikes Noted: No Respiratory Status: Room air History of Recent Intubation: No Behavior/Cognition: Alert;Cooperative;Pleasant mood Oral Cavity - Dentition: Adequate  natural dentition/normal for age (bottom partial, left out given small size, risk of choking) Self-Feeding Abilities: Able to feed self Patient Positioning: Upright in chair/Tumbleform Baseline Vocal Quality: Normal Volitional Cough: Strong;Congested Volitional Swallow: Able to elicit     Oral/Motor/Sensory Function Overall Oral Motor/Sensory Function: Appears within functional limits for tasks assessed   Ice Chips Ice chips: Not tested   Thin Liquid Thin Liquid: Within functional limits Presentation: Self Fed;Straw    Nectar Thick Nectar Thick Liquid: Not tested   Honey Thick Honey Thick Liquid: Not tested   Puree Puree: Within functional limits Presentation: Spoon   Solid   GO    Solid: Impaired Oral Phase Impairments: Impaired anterior to posterior transit Oral Phase Functional Implications: Oral residue;Oral holding      Tracie Rainwater MA, CCC-SLP [phone]   [name] Meryl 12/28/2014,3:05 PM

## 2014-12-28 NOTE — Evaluation (Signed)
Physical Therapy Evaluation Patient Details Name: [name] MRN: [id number] DOB: 02/27/1935 Today's Date: 12/28/2014   History of Present Illness  Pt admitted with increased confusion x 2 weeks, acute renal failure and hypercalcemia  Clinical Impression  Pt admitted as above and presenting with functional mobility limitations 2* generalized weakness, balance deficits and changes from baseline in cognition.  Based on current status, pt would benefit from follow up rehab at SNF level to maximize IND and safety.    Follow Up Recommendations SNF    Equipment Recommendations  None recommended by PT    Recommendations for Other Services OT consult     Precautions / Restrictions Precautions Precautions: Fall Precaution Comments: Hx of falls at home Restrictions Weight Bearing Restrictions: No      Mobility  Bed Mobility Overal bed mobility: +2 for physical assistance;Needs Assistance Bed Mobility: Supine to Sit     Supine to sit: Mod assist;+2 for physical assistance     General bed mobility comments: Pt with difficulty comprehending task - utilized pad to bring pt to EOB  Transfers Overall transfer level: Needs assistance Equipment used: Rolling walker (2 wheeled) Transfers: Sit to/from Stand Sit to Stand: Mod assist;+2 physical assistance;+2 safety/equipment (From EOB, to/fom comode, to chair)         General transfer comment: cues for transition position, saftey with transfers and use of UEs to self assist  Ambulation/Gait Ambulation/Gait assistance: +2 safety/equipment;Mod assist Ambulation Distance (Feet): 200 Feet Assistive device: Rolling walker (2 wheeled) Gait Pattern/deviations: Step-through pattern;Decreased step length - right;Decreased step length - left;Shuffle;Trunk flexed;Drifts right/left     General Gait Details: pt with R lean and requiring constant cues for posture, pacing, position from RW and attention to front vs down and  right  Stairs            Wheelchair Mobility    Modified Rankin (Stroke Patients Only)       Balance Overall balance assessment: Needs assistance Sitting-balance support: Feet supported;Bilateral upper extremity supported Sitting balance-Leahy Scale: Poor   Postural control: Posterior lean;Right lateral lean Standing balance support: Bilateral upper extremity supported Standing balance-Leahy Scale: Poor Standing balance comment: right posterior drift                             Pertinent Vitals/Pain Pain Assessment: No/denies pain    Home Living Family/patient expects to be discharged to:: Private residence Living Arrangements: Alone Available Help at Discharge: Other (Comment) (Dtr checks on) Type of Home: House Home Access: Stairs to enter Entrance Stairs-Rails: Psychiatric nurse of Steps: 5 Home Layout: Able to live on main level with bedroom/bathroom Home Equipment: Walker - 2 wheels;Cane - single point      Prior Function Level of Independence: Independent with assistive device(s)               Hand Dominance        Extremity/Trunk Assessment   Upper Extremity Assessment: Generalized weakness           Lower Extremity Assessment: Generalized weakness      Cervical / Trunk Assessment: Kyphotic  Communication   Communication: No difficulties  Cognition Arousal/Alertness: Awake/alert Behavior During Therapy: Impulsive Overall Cognitive Status: Impaired/Different from baseline Area of Impairment: Following commands;Safety/judgement;Problem solving       Following Commands: Follows one step commands inconsistently;Follows one step commands with increased time Safety/Judgement: Decreased awareness of safety   Problem Solving: Slow processing;Requires verbal cues;Requires tactile cues;Decreased  initiation General Comments: Dtr present and and states this is much different than pt's norm    General Comments       Exercises        Assessment/Plan    PT Assessment Patient needs continued PT services  PT Diagnosis Difficulty walking;Altered mental status   PT Problem List Decreased strength;Decreased range of motion;Decreased activity tolerance;Decreased balance;Decreased mobility;Decreased knowledge of use of DME;Decreased safety awareness;Decreased cognition  PT Treatment Interventions DME instruction;Gait training;Stair training;Functional mobility training;Therapeutic activities;Therapeutic exercise;Patient/family education;Cognitive remediation   PT Goals (Current goals can be found in the Care Plan section) Acute Rehab PT Goals Patient Stated Goal: No specific goals expressed by pt.  Pt's dtr is hopeful for return home PT Goal Formulation: With patient/family Time For Goal Achievement: 01/11/15 Potential to Achieve Goals: Fair    Frequency Min 3X/week   Barriers to discharge Decreased caregiver support Pt home alone with intermittant assist of dtr    Co-evaluation               End of Session Equipment Utilized During Treatment: Gait belt Activity Tolerance: Patient tolerated treatment well Patient left: in chair;with call bell/phone within reach;with family/visitor present;Other (comment) (speech therapist) Nurse Communication: Mobility status         Time: 1400-1450 PT Time Calculation (min) (ACUTE ONLY): 50 min   Charges:   PT Evaluation $Initial PT Evaluation Tier I: 1 Procedure PT Treatments $Gait Training: 8-22 mins $Therapeutic Activity: 8-22 mins   PT G Codes:        [name] 2015-01-05, 3:10 PM

## 2014-12-29 DIAGNOSIS — R4182 Altered mental status, unspecified: Secondary | ICD-10-CM

## 2014-12-29 DIAGNOSIS — R911 Solitary pulmonary nodule: Secondary | ICD-10-CM

## 2014-12-29 DIAGNOSIS — T40605A Adverse effect of unspecified narcotics, initial encounter: Secondary | ICD-10-CM

## 2014-12-29 LAB — BASIC METABOLIC PANEL
Anion gap: 9 (ref 5–15)
BUN: 28 mg/dL — ABNORMAL HIGH (ref 6–20)
CO2: 25 mmol/L (ref 22–32)
Calcium: 9.6 mg/dL (ref 8.9–10.3)
Chloride: 106 mmol/L (ref 101–111)
Creatinine, Ser: 1.2 mg/dL — ABNORMAL HIGH (ref 0.44–1.00)
GFR calc Af Amer: 49 mL/min — ABNORMAL LOW (ref >60)
GFR, EST NON AFRICAN AMERICAN: 42 mL/min — AB (ref >60)
Glucose, Bld: 98 mg/dL (ref 65–99)
Potassium: 3.8 mmol/L (ref 3.5–5.1)
Sodium: 140 mmol/L (ref 135–145)

## 2014-12-29 LAB — CBC
HEMATOCRIT: 41.1 % (ref 36.0–46.0)
Hemoglobin: 13.2 g/dL (ref 12.0–15.0)
MCH: 29.1 pg (ref 26.0–34.0)
MCHC: 32.1 g/dL (ref 30.0–36.0)
MCV: 90.5 fL (ref 78.0–100.0)
Platelets: 225 10*3/uL (ref 150–400)
RBC: 4.54 MIL/uL (ref 3.87–5.11)
RDW: 13.7 % (ref 11.5–15.5)
WBC: 7.6 10*3/uL (ref 4.0–10.5)

## 2014-12-29 LAB — PTH-RELATED PEPTIDE: PTH-related peptide: <0.74 pmol/L

## 2014-12-29 NOTE — Evaluation (Signed)
Speech Language Pathology Evaluation Patient Details Name: [name] MRN: [id number] DOB: 11/02/1934 Today's Date: 12/29/2014 Time: 9629-5284 SLP Time Calculation (min) (ACUTE ONLY): 55 min  Problem List:  Patient Active Problem List   Diagnosis Date Noted  . Narcotic induced mental alteration- increase confusion 12/29/2014  . Acute kidney injury 12/24/2014  . AKI (acute kidney injury) 12/24/2014  . Acute encephalopathy 12/24/2014  . Dementia 12/24/2014  . Essential hypertension, benign 12/24/2014  . Hydrocephalus 12/20/2013  . Auditory hallucinations 12/16/2013  . Visual hallucinations 12/16/2013  . Acquired obstructive hydrocephalus 12/16/2013  . Posterior fossa tumor 12/16/2013  . Delirium 11/21/2013  . Syncope 11/21/2013  . Lung nodule 11/21/2013  . Hypercalcemia 01/22/2012  . Hyperlipidemia 01/22/2012  . Tobacco user 01/22/2012  . HTN (hypertension), benign 01/13/2012  . Vitamin D deficiency 01/13/2012  . Osteoporosis 01/13/2012  . CAD (coronary artery disease) 01/13/2012   Past Medical History:  Past Medical History  Diagnosis Date  . Hyperlipidemia   . Essential hypertension, benign   . CAD (coronary artery disease)   . Osteoporosis   . Hypercalcemia   . Vitamin D deficiency   . Ovarian tumor   . Breast tumor   . MI (myocardial infarction) July 1995    Treated with LAD PCI- [name]  . Cataract   . Complication of anesthesia   . PONV (postoperative nausea and vomiting)   . Lung tumor     recently found approx 25 days ago-Tracie Tamala Julian, MD   Past Surgical History:  Past Surgical History  Procedure Laterality Date  . Angioplasty    . Hip pinning    . Breast lumpectomy    . Abdominal hysterectomy    . Cholecystectomy    . Fracture surgery  Nov 2003    L distal radius fx- Tracie. Frederik Crane  . Eye surgery      Cataract removal  . Cardiac catheterization      1990's  . Ventriculoperitoneal shunt Right 12/20/2013    Procedure: SHUNT INSERTION  VENTRICULAR-PERITONEAL;  Surgeon: Tracie Cunas, MD;  Location: [facility];  Service: Neurosurgery;  Laterality: Right;  VP shunt insertion  . Doppler echocardiography  2010   HPI:  [name] is a 80 y.o. female past medical history of coronary artery disease, hypertension, hyperlipidemia, hydrocephalus status post VP shunt last year, hypercalcemia, lung nodule which the patient does not want worked up, and paranoia, admitted with increasing confusion x 2 weeks. Dx with AKI, hypercalcemia, hyperkalemia, acute encephalopathy, mild hypernatremia. CXR and head CT negative. Dtr reports progressive decline in cognition last several months.  She voices particular concerns for dementia but states that her mother does not have a dementia dx.   Assessment / Plan / Recommendation Clinical Impression  Pt presents with moderate cognitive-linguistic deficits marked by anomia, verbal and motor perseverations, and difficulty initiating, sustaining, and ceasing attention appropriately.  She demonstrates difficulty with functional problem solving in order to brush her teeth or organize her meal tray.  There is limited insight into deficits, but occasional vague recognition that she is having difficulty performing a task (for example, during a clock drawing task, which pt was unable to perform, she reported she was having difficulty with her eyes). Pt will require 24 hour supervision given her current deficits.  Recommend consideration of a neurology consult.  Daughter present for assessment and education.  SLP will follow acutely for language/cognitive goals.      SLP Assessment  Patient needs continued Speech  [facility]    Follow Up Recommendations  Skilled Nursing facility    Frequency and Duration min 3x week  1 week   Pertinent Vitals/Pain Pain Assessment: No/denies pain Faces Pain Scale: Hurts a little bit Pain Location: L arm Pain Descriptors / Indicators: Sore Pain  Intervention(s): Limited activity within patient's tolerance;Monitored during session   SLP Goals  Potential to Achieve Goals (ACUTE ONLY): Good  SLP Evaluation Prior Functioning  Cognitive/Linguistic Baseline: Baseline deficits Baseline deficit details: processing time, initiation, paranoia Type of Home: House  Lives With: Alone Education: masters degree   Cognition  Overall Cognitive Status: Impaired/Different from baseline Arousal/Alertness: Awake/alert Orientation Level: Oriented to person;Disoriented to place;Disoriented to time;Disoriented to situation Attention: Focused Focused Attention: Appears intact Memory: Impaired Awareness: Impaired Problem Solving: Impaired Problem Solving Impairment: Verbal basic;Functional basic Executive Function: Initiating;Self Monitoring Initiating: Impaired Self Monitoring: Impaired Behaviors: Perseveration Safety/Judgment: Impaired    Comprehension  Auditory Comprehension Overall Auditory Comprehension: Impaired Yes/No Questions: Within Functional Limits Commands: Impaired Multistep Basic Commands: 25-49% accurate Visual Recognition/Discrimination Discrimination: Within Function Limits    Expression Expression Primary Mode of Expression: Verbal Verbal Expression Overall Verbal Expression: Impaired Level of Generative/Spontaneous Verbalization: Conversation Repetition: No impairment Naming: Impairment Responsive: 76-100% accurate Confrontation: Within functional limits Convergent: Not tested Divergent: 0-24% accurate Verbal Errors: Semantic paraphasias;Perseveration Pragmatics: No impairment Interfering Components: Attention Written Expression Dominant Hand: Right Written Expression: Exceptions to WFL (micrographia; perseverative output)   Oral / Motor Oral Motor/Sensory Function Overall Oral Motor/Sensory Function: Appears within functional limits for tasks assessed Motor Speech Overall Motor Speech: Appears within  functional limits for tasks assessed   [name] L. Tivis Ringer, [place] CCC/SLP Pager [phone]      [name] 12/29/2014, 12:43 PM

## 2014-12-29 NOTE — Evaluation (Signed)
Occupational Therapy Evaluation Patient Details Name: ROANNE HAYE MRN: [id number] DOB: 08-01-1934 Today's Date: 12/29/2014    History of Present Illness Pt admitted with increased confusion x 2 weeks, acute renal failure and hypercalcemia   Clinical Impression   This 80 year old female was admitted for the above.  At baseline, she is mod I with basic ADLs and daughter checks on her. She currently needs mod A x 2 for safety with transfers and mostly max A for ADLs due to cognition.  Will follow in acute with min level goals.    Follow Up Recommendations  SNF    Equipment Recommendations  3 in 1 bedside comode    Recommendations for Other Services       Precautions / Restrictions Precautions Precautions: Fall Precaution Comments: Hx of falls at home Restrictions Weight Bearing Restrictions: No      Mobility Bed Mobility   Bed Mobility: Supine to Sit     Supine to sit: Mod assist;+2 for physical assistance     General bed mobility comments: pt with slow processing  Transfers   Equipment used: Rolling walker (2 wheeled) Transfers: Sit to/from [facility] Sit to Stand: Min assist;+2 safety/equipment         General transfer comment: cues for safety:  pt sat unexpectedly    Balance     Sitting balance-Leahy Scale: Fair     Standing balance support: Bilateral upper extremity supported Standing balance-Leahy Scale: Poor Standing balance comment: pt leaned posteriorly                             ADL Overall ADL's : Needs assistance/impaired     Grooming: Wash/dry hands;Wash/dry face;Sitting;Supervision/safety (multimodal cues and extra time)   Upper Body Bathing: Maximal assistance;Sitting   Lower Body Bathing: Maximal assistance;Sit to/from stand   Upper Body Dressing : Minimal assistance;Sitting   Lower Body Dressing: Maximal assistance;Sit to/from stand   Toilet Transfer: Moderate assistance;+2 for  safety/equipment;Stand-pivot (to recliner)   Toileting- Clothing Manipulation and Hygiene: Maximal assistance;Sit to/from stand (based on bathing)         General ADL Comments: Pt very slow with bathing and very distracted:  see cognitive section above.  Multimodal cues and extra time for all activities.  Pt did not want assistance and daughter states that she is very private.  Kept pt as covered as possible during ADL     Vision     Perception     Praxis      Pertinent Vitals/Pain Pain Assessment: Faces Faces Pain Scale: Hurts a little bit Pain Location: L arm Pain Descriptors / Indicators: Sore Pain Intervention(s): Limited activity within patient's tolerance;Monitored during session     Hand Dominance     Extremity/Trunk Assessment Upper Extremity Assessment Upper Extremity Assessment: LUE deficits/detail;Generalized weakness LUE Deficits / Details: c/o pain in L shoulder after fall.  Tolerated AAROM to 30 for ADLs           Communication Communication Communication: No difficulties   Cognition Arousal/Alertness: Awake/alert Behavior During Therapy: Impulsive Overall Cognitive Status: Impaired/Different from baseline                 General Comments: daughter present and told MD pt is doing much better cognitively: slow processing and needed multimodal cues to follow commands.  Pt with focused attention--distracted herself talking while bathing and perseverted on one area at a time.  Decreased safety with transfer  General Comments       Exercises       Shoulder Instructions      Home Living Family/patient expects to be discharged to:: Private residence Living Arrangements: Alone   Type of Home: House Home Access: Stairs to enter           Bathroom Shower/Tub:  (sponge bathes at sink)   Biochemist, clinical: Standard     Home Equipment: Environmental consultant - 2 wheels;Cane - single point   Additional Comments: daughter checks on pt      Prior  Functioning/Environment Level of Independence: Independent with assistive device(s)             OT Diagnosis: Generalized weakness;Cognitive deficits;Acute pain   OT Problem List: Decreased strength;Decreased activity tolerance;Impaired balance (sitting and/or standing);Decreased cognition;Decreased knowledge of use of DME or AE;Decreased safety awareness;Pain;Impaired UE functional use   OT Treatment/Interventions: Self-care/ADL training;DME and/or AE instruction;Patient/family education;Balance training;Therapeutic activities;Cognitive remediation/compensation    OT Goals(Current goals can be found in the care plan section) Acute Rehab OT Goals Patient Stated Goal: No specific goals expressed by pt.  Pt's dtr is hopeful for return home OT Goal Formulation:  (discussed generally with pt) Time For Goal Achievement: 01/12/15 Potential to Achieve Goals: Fair ADL Goals Pt Will Transfer to Toilet: with min assist;bedside commode;stand pivot transfer Additional ADL Goal #1: pt will sustain attention to UB adl tasks with min cues and perform each task with supervision Additional ADL Goal #2: Pt will stand at sink for teeth with min guard  A and min cues for task Additional ADL Goal #3: Pt will complete LB adls with min A, sit to stand and min cues Additional ADL Goal #4: Pt will use LUE within painfree tolerance for adls with min cues  OT Frequency: Min 2X/week   Barriers to D/C:            Co-evaluation              End of Session    Activity Tolerance: Patient tolerated treatment well Patient left: in chair;with call bell/phone within reach;with chair alarm set;with family/visitor present   Time: 2774-1287 OT Time Calculation (min): 31 min Charges:  OT General Charges $OT Visit: 1 Procedure OT Evaluation $Initial OT Evaluation Tier I: 1 Procedure OT Treatments $Self Care/Home Management : 8-22 mins G-Codes:    Antavion Bartoszek January 27, 2015, 9:23 AM  Lesle Chris,  OTR/L [phone] 2015/01/27

## 2014-12-29 NOTE — Clinical Social Work Placement (Signed)
   CLINICAL SOCIAL WORK PLACEMENT  NOTE  Date:  12/29/2014  Patient Details  Name: ZAKIA SAINATO MRN: [id number] Date of Birth: 18-Feb-1935  Clinical Social Work is seeking post-discharge placement for this patient at the [facility] level of care (*CSW will initial, date and re-position this form in  chart as items are completed):  Yes   Patient/family provided with [facility] Work Department's list of facilities offering this level of care within the geographic area requested by the patient (or if unable, by the patient's family).  Yes   Patient/family informed of their freedom to choose among providers that offer the needed level of care, that participate in Medicare, Medicaid or managed care program needed by the patient, have an available bed and are willing to accept the patient.  Yes   Patient/family informed of [facility]'s ownership interest in [facility] and [facility], as well as of the fact that they are under no obligation to receive care at these facilities.  PASRR submitted to EDS on 12/29/14     PASRR number received on       Existing PASRR number confirmed on       FL2 transmitted to all facilities in geographic area requested by pt/family on 12/29/14     FL2 transmitted to all facilities within larger geographic area on       Patient informed that his/her managed care company has contracts with or will negotiate with certain facilities, including the following:            Patient/family informed of bed offers received.  Patient chooses bed at       Physician recommends and patient chooses bed at      Patient to be transferred to   on  .  Patient to be transferred to facility by       Patient family notified on   of transfer.  Name of family member notified:        PHYSICIAN       Additional Comment:    _______________________________________________ Boone Master, [place] 12/29/2014, 1:41 PM     12/29/14-PASARR sent for manual review but office is closed due to holiday. Clinicals will be submitted on 12/30/14.

## 2014-12-29 NOTE — Progress Notes (Signed)
[facility] Progress Note   [name] KWI:[id number] DOB: [birth year] DOA: 12/24/2014 PCP: Reginia Forts, MD  Brief narrative: [name] is a 80 y.o. female past medical history of coronary artery disease, hypertension, hyperlipidemia, hydrocephalus status post VP shunt last year, hypercalcemia, lung nodule which the patient does not want worked up and paranoia. The patient's daughter gives a history today. She states that the patient has been increasingly confused for the past 2 weeks. An MRI was performed by her PCP and no acute issues were seen on this MRI. The daughter has been checking on her mother regularly over this 2 wk period and is making sure that she is taking her medications appropriately. She brought her into the hospital when she was found half naked out in her yard. She was found to have acute renal failure and hypercalcemia.   Subjective: Confusion is much improved-according to the daughter, the patient is almost 2 the baseline that she was at greater than 2 weeks ago. The patient currently has no complaints of pain nausea vomiting diarrhea. She is in no good breakfast.  Assessment/Plan: Principal Problem:   AKI (acute kidney injury) -Improving daily with hydration- now back to baseline -UA checked and mild proteinuria noted- checked pro/cr ratio does not reveal significant proteinuria  -losartan discontinued on admission- no need to resume- BP not significantly elevated  - SPEP negative- UPEP pending  Active Problems: Hypercalcemia -Chronic hypercalcemia with acute exacerbation -calcium 12.5 on admission- reviewed labs from last year- calcium levels were 10.5-11.4- she was maintained on calcium and vitamin D supplements as outpatient which I have discontinued -SPEP negative - UPEP pending - intact PTH is normal  - PTH related peptide is also normal - check Vit D level -With IV hydration, sodium approved from 12.5-10 but did not improve  further -Have significantly given Lasix IV  and give a dose of pamidronate-calcium now normalized  Chest pain -7/2 AM--patient was quite restless tachycardic and hypertensive with this pain-Resolved with Dilaudid-first troponin mildly elevated-note: troponin was elevated on 6/29 as well but as she was not having any pain, it was not pursued or repeated- - EKG unrevealing- 2 more sets of troponin negative  Hypokalemia - replaced  Acute encephalopathy/ h/o paranoia with symptoms of seeing people in her house - He is been on Risperdal for paranoia (likely dementia)  -  Acute encephalopathy is likely related to acute renal failure and hypercalcemia and has resolved today -  B12 low normal- will replace - RPR  negative -now that acute encephalopathy has resolved, will need a neuropsych eval to determine the cause of dementa - daughter in agreement  Mild hypernatremia  -Probably secondary to normal saline infusion- changed to half-normal saline with improvement  Hyperthyroidism -This is progressive since 1 year ago when lab work revealed hyperthyroidism  Ref. Range 11/20/2013 22:25 11/21/2013 04:28 12/15/2014 17:04  TSH Latest Ref Range: 0.350-4.500 uIU/mL 0.033 (L)  0.019 (L)  Free T4 Latest Ref Range: 0.80-1.80 ng/dL  1.93 (H) 2.19 (H)  T3, Free Latest Ref Range: 2.3-4.2 pg/mL  3.4   - started Methimazole    Lung nodule -did not want this worked up in the past- If the patient is found to be mentally competent and does not want this worked up, no further scans will need to be done    Acquired obstructive hydrocephalus -has a VP shunt which appears to be functioning appropriately based on recent MRI and CT scans    Essential hypertension, benign -Continue Lopressor-hold  losartan due to renal failure  Code Status: Full code Family Communication: Daughter who is POA Disposition Plan:  SNF  DVT prophylaxis: Lovenox Consultants: Procedures:  Antibiotics: Anti-infectives    None       Objective: Filed Weights   12/24/14 2004  Weight: 55.339 kg (122 lb)   No intake or output data in the 24 hours ending 12/29/14 0959   Vitals Filed Vitals:   12/28/14 2104 12/29/14 0207 12/29/14 0509 12/29/14 0951  BP: 151/68 131/46 129/53 142/42  Pulse: 92 88 78 90  Temp: 98.5 F (36.9 C) 99.4 F (37.4 C) 98.2 F (36.8 C) 98.5 F (36.9 C)  TempSrc: Oral Oral Oral Oral  Resp: 16 16 16 18   Height:      Weight:      SpO2: 96% 94% 99% 97%    Exam:  General:  Pt is alert, not in acute distress- confused to time and place  HEENT: No icterus, No thrush, oral mucosa moist  Cardiovascular: regular rate and rhythm, S1/S2 No murmur  Respiratory: clear to auscultation bilaterally   Abdomen: Soft, +Bowel sounds, non tender, non distended, no guarding  MSK: No LE edema, cyanosis or clubbing  Data Reviewed: Basic Metabolic Panel:  Recent Labs Lab 12/25/14 0600 12/26/14 0533 12/27/14 0630 12/28/14 0558 12/29/14 0530  NA 146* 143 144 140 140  K 3.6 3.1* 3.8 3.7 3.8  CL 111 110 111 107 106  CO2 25 24 18* 23 25  GLUCOSE 103* 92 153* 122* 98  BUN 44* 34* 22* 23* 28*  CREATININE 2.81* 1.95* 1.40* 1.27* 1.20*  CALCIUM 11.5* 10.5* 10.9* 9.9 9.6  MG  --  1.8  --   --   --    Liver Function Tests:  Recent Labs Lab 12/24/14 2023  AST 43*  ALT 22  ALKPHOS 114  BILITOT 0.9  PROT 7.0  ALBUMIN 3.9   No results for input(s): LIPASE, AMYLASE in the last 168 hours. No results for input(s): AMMONIA in the last 168 hours. CBC:  Recent Labs Lab 12/24/14 2023 12/25/14 0600 12/28/14 0558 12/29/14 0530  WBC 13.1* 11.4* 8.3 7.6  HGB 14.7 14.0 13.5 13.2  HCT 45.3 43.2 42.0 41.1  MCV 89.5 91.3 89.6 90.5  PLT 236 228 212 225   Cardiac Enzymes:  Recent Labs Lab 12/24/14 2023 12/27/14 0630 12/27/14 1555 12/27/14 2150  TROPONINI 0.05* 0.04* 0.03 <0.03   BNP (last 3 results) No results for input(s): BNP in the last 8760 hours.  ProBNP (last 3 results) No  results for input(s): PROBNP in the last 8760 hours.  CBG: No results for input(s): GLUCAP in the last 168 hours.  Recent Results (from the past 240 hour(s))  Urine culture     Status: None   Collection Time: 12/24/14  8:37 PM  Result Value Ref Range Status   Specimen Description URINE, CATHETERIZED  Final   Special Requests NONE  Final   Culture   Final    NO GROWTH 2 DAYS Performed at [facility]    Report Status 12/26/2014 FINAL  Final     Studies: Ct Head Wo Contrast  12/28/2014   CLINICAL DATA:  Hydrocephalus, status post VP shunt last year. Hypercalcemia. Confusion.  EXAM: CT HEAD WITHOUT CONTRAST  TECHNIQUE: Contiguous axial images were obtained from the base of the skull through the vertex without intravenous contrast.  COMPARISON:  12/24/2014 head CT and 12/17/2014 brain MR.  FINDINGS: Sinuses/Soft tissues: Minimal mucosal thickening of the right sphenoid  sinus. Other paranasal sinuses and mastoid air cells clear.  Intracranial: VP shunt catheter from right frontal approach terminates in the left lateral ventricle, similar.  Moderate low density in the periventricular white matter likely related to small vessel disease. Similar mild ventriculomegaly, felt to be secondary to advanced cerebral and cerebellar atrophy. Remote lacunar infarct in the left side of the thalamus on image 15. A smaller remote lacune is suspected in the right thalamus.  Calcified lesion in the fourth ventricle is grossly similar at 1.6 cm. No acute infarct, hemorrhage, intra-axial, or extra-axial fluid collection.  IMPRESSION: 1. No significant change since 12/17/2014. 2. Fourth ventricular lesion, as detailed on 12/17/2014 MRI. 3. VP shunt catheter in place with similar mild ventriculomegaly, felt to be secondary to cerebral atrophy. 4. Moderate small vessel ischemic change.   Electronically Signed   By: Abigail Miyamoto M.D.   On: 12/28/2014 10:52   Dg Chest Port 1 View  12/28/2014   CLINICAL DATA:   Confusion.  Chest rales.  EXAM: PORTABLE CHEST - 1 VIEW  COMPARISON:  12/24/2014  FINDINGS: Right-sided VP shunt catheter. Surgical clips in the right upper quadrant. Remote left humeral head trauma suspected. Midline trachea. Mild cardiomegaly with a tortuous thoracic aorta. No pleural effusion or pneumothorax. No congestive failure. Mild atelectasis or scarring at both lung bases.  IMPRESSION: No acute cardiopulmonary disease.  Cardiomegaly without congestive failure.  Aortic atherosclerosis.   Electronically Signed   By: Abigail Miyamoto M.D.   On: 12/28/2014 11:12    Scheduled Meds:  Scheduled Meds: . amLODipine  5 mg Oral Daily  . enoxaparin (LOVENOX) injection  30 mg Subcutaneous Q24H  . lidocaine  2 patch Transdermal Daily  . methimazole  15 mg Oral TID  . metoprolol succinate  50 mg Oral Daily  . multivitamin with minerals  1 tablet Oral Daily  . pravastatin  40 mg Oral Daily  . risperiDONE  0.5 mg Oral QHS  . sodium chloride  3 mL Intravenous Q12H   Continuous Infusions:    Time spent on care of this patient: 35 min   [place], MD 12/29/2014, 9:59 AM  LOS: 5 days   [facility] Office  [phone] Pager - Text Page per [email or web address] If 7PM-7AM, please contact night-coverage [email or web address]

## 2014-12-29 NOTE — Clinical Social Work Note (Signed)
Clinical Social Work Assessment  Patient Details  Name: [name] MRN: [id number] Date of Birth: 25-Feb-1935  Date of referral:  12/29/14               Reason for consult:  Discharge Planning                Permission sought to share information with:  Family Supports Permission granted to share information::  Yes, Verbal Permission Granted  Name::     Arts administrator::     Relationship::  dtr  Contact Information:     Housing/Transportation Living arrangements for the past 2 months:  Apartment Source of Information:  Patient, Adult Children Patient Interpreter Needed:  None Criminal Activity/Legal Involvement Pertinent to Current Situation/Hospitalization:  No - Comment as needed Significant Relationships:  Adult Children Lives with:  Self, Pets Do you feel safe going back to the place where you live?  No Need for family participation in patient care:  Yes (Comment)  Care giving concerns:  Patient lives home alone and dtr is unable to provide 24/7 care.   Social Worker assessment / plan:  CSW received referral in order to assist with DC planning. CSW reviewed chart and met with patient and dtr at bedside. CSW introduced myself and explained role.  Patient lives home alone and reports she was doing well prior to admission but that dtr assists as needed. Patient reports that her dtr gave her a cat for her birthday because she had been lonely. CSW discussed PT/OT and MD recommendations for SNF placement. Patient has been to [facility] in the past and reports she would prefer to return home. After a long discussion, patient became tearful and concerned about her home and cat if she were to go to rehab. CSW validated feelings and encouraged patient and dtr to create a plan so that dtr could care for pets and house while patient focused on rehab. Patient likes to be social and agreeable to [facility] if available because she has friends that live there. CSW provided SNF  list and explained process. Dtr agreeable to research other options in case [facility] is unable to accept.  CSW completed FL2 and faxed out. CSW will follow up with bed offers.   Employment status:  Retired Nurse, adult PT Recommendations:  [place] / Referral to community resources:  [place]  Patient/Family's Response to care:  Patient and dtr have conflicting ideas re: what is best DC plan. Dtr agreeable to SNF but patient prefers to return home. Patient is unrealistic re: amount of help that would be needed to return home. Patient is fixated on cat's wellbeing and worried about being away from home.  Patient/Family's Understanding of and Emotional Response to Diagnosis, Current Treatment, and Prognosis:  Patient does not believe SNF is needed because she reports she is independent. Patient unwilling to talk about the amount of needs she has and the amount of assistance required to assist with ambulating. Patient believes she could return home safely without any needs. Patient ultimately agreeable to consider SNF.  Emotional Assessment Appearance:  Appears stated age Attitude/Demeanor/Rapport:  Irrational, Crying Affect (typically observed):  Depressed, In denial Orientation:  Oriented to Self Alcohol / Substance use:  Illicit Drugs Psych involvement (Current and /or in the community):  No (Comment)  Discharge Needs  Concerns to be addressed:  Discharge Planning Concerns Readmission within the last 30 days:  No Current discharge risk:  Lives  alone Barriers to Discharge:  [place] ([place])   Boone Master, [place] 12/29/2014, 1:43 PM [phone]

## 2014-12-29 NOTE — Progress Notes (Signed)
Speech Language Pathology Treatment: Dysphagia  Patient Details Name: [name] MRN: [id number] DOB: [birth year] Today's Date: 12/29/2014 Time: 6606-0045 SLP Time Calculation (min) (ACUTE ONLY): 20 min  Assessment / Plan / Recommendation Clinical Impression  Assisted pt/daughter with compensatory strategies to facilitate more efficient, safer eating.  Daughter reports it can take as long as two hours for her mother to finish a meal.  Prolonged mealtimes are due to pt's cognitive deficits, particularly inattention, poor initiation, and high distractibility.  Pt will be more successful when table is cleared of all extraneous items, one food item is provided at a time, television is turned off and door is closed.  Educated dtr re: the need to reduce distractions (both visual and auditory) from the environment.  Pt consumed thin liquids and solids with slow but functional mastication; no s/s of aspiration.  Recommend continuing current diet with recommendations as described.  Will follow briefly for further education.     HPI Other Pertinent Information: QUINNLYN Crane is a 80 y.o. female past medical history of coronary artery disease, hypertension, hyperlipidemia, hydrocephalus status post VP shunt last year, hypercalcemia, lung nodule which the patient does not want worked up, and paranoia, admitted with increasing confusion x 2 weeks. Dx with AKI, hypercalcemia, hyperkalemia, acute encephalopathy, mild hypernatremia. CXR and head CT negative. Dtr reports progressive decline in cognition last several months.  She voices particular concerns for dementia but states that her mother does not have a dementia dx.   Pertinent Vitals Pain Assessment: No/denies pain Faces Pain Scale: Hurts a little bit Pain Location: L arm Pain Descriptors / Indicators: Sore Pain Intervention(s): Limited activity within patient's tolerance;Monitored during session  SLP Plan  Continue with current plan of care     Recommendations Diet recommendations: Dysphagia 3 (mechanical soft);Thin liquid Liquids provided via: Cup;Straw Medication Administration: Whole meds with liquid Supervision:  (set up) Compensations: Slow rate;Small sips/bites;Check for pocketing Postural Changes and/or Swallow Maneuvers: Seated upright 90 degrees              Follow up Recommendations: Skilled Nursing facility Plan: Continue with current plan of care    Georgiana Spillane L. Tivis Ringer, [place] CCC/SLP Pager [phone]      Juan Quam Laurice 12/29/2014, 12:49 PM

## 2014-12-29 NOTE — Progress Notes (Signed)
Physical Therapy Treatment Patient Details Name: [name] MRN: [id number] DOB: 11/27/1934 Today's Date: 12/29/2014    History of Present Illness Pt admitted with increased confusion x 2 weeks, acute renal failure and hypercalcemia    PT Comments    Pt sleeping in recliner.  Slow to wake.  Assisted from recliner to [facility] as RN reports pt is incont urine.  Groggy and slow pt required repeat cueing to focus on task and nearly fell backward on commode due to incomplete turn and no use of hands to steady self.  Assisted with hygiene and then amb in hallway using a RW.  Very unsteady gait, drifting R and L.  Poor cognition requiring repeat cueing for direction. Assisted back to bed and applied alarm.   Very pleasant retired Pharmacist, hospital.  Will need ST Rehab at SNF prior to D/C back home.   Follow Up Recommendations  SNF     Equipment Recommendations       Recommendations for Other Services       Precautions / Restrictions Precautions Precautions: Fall Precaution Comments: Hx of falls at home Restrictions Weight Bearing Restrictions: No    Mobility  Bed Mobility Overal bed mobility: Needs Assistance Bed Mobility: Sit to Supine       Sit to supine: Min assist   General bed mobility comments: increased time  Transfers Overall transfer level: Needs assistance Equipment used: Rolling walker (2 wheeled) Transfers: Sit to/from Stand Sit to Stand: Min assist         General transfer comment: 75% VC's on safety and turn completion.  Near fall posteriorly while turning and backibg up to [facility].  Dleayed response and corrective reaction.  Controlled decend by Therapist.   Ambulation/Gait Ambulation/Gait assistance: Min assist;Mod assist Ambulation Distance (Feet): 85 Feet Assistive device: Rolling walker (2 wheeled) Gait Pattern/deviations: Step-to pattern;Step-through pattern;Drifts right/left Gait velocity: decreased   General Gait Details: Very unsteady gait with lateral  shift.  Poor attention to task at hand as pt stopped to look and read everything on the walls.  Poor self use of walker in reguards to advancement and negociating around obsticles.  Slow gait with poor self awareness of midline balance.  HIGH FALL RISK.    Stairs            Wheelchair Mobility    Modified Rankin (Stroke Patients Only)       Balance                                    Cognition Arousal/Alertness: Awake/alert Behavior During Therapy: Flat affect Overall Cognitive Status: Impaired/Different from baseline Area of Impairment: Following commands;Safety/judgement;Problem solving       Following Commands: Follows one step commands inconsistently;Follows one step commands with increased time Safety/Judgement: Decreased awareness of safety   Problem Solving: Slow processing;Requires verbal cues;Requires tactile cues;Decreased initiation General Comments: required repeat functional cueing to stay on task.     Exercises      General Comments        Pertinent Vitals/Pain Pain Assessment: No/denies pain    Home Living       Type of Home: House              Prior Function            PT Goals (current goals can now be found in the care plan section) Progress towards PT goals: Progressing toward goals  Frequency  Min 3X/week    PT Plan      Co-evaluation             End of Session Equipment Utilized During Treatment: Gait belt Activity Tolerance: Patient tolerated treatment well Patient left: in bed;with call bell/phone within reach;with bed alarm set     Time: 0940-7680 PT Time Calculation (min) (ACUTE ONLY): 26 min  Charges:  $Gait Training: 8-22 mins $Therapeutic Activity: 8-22 mins                    G Codes:      Rica Koyanagi  PTA [facility]  Acute  Rehab Pager      [phone]

## 2014-12-30 DIAGNOSIS — E0590 Thyrotoxicosis, unspecified without thyrotoxic crisis or storm: Secondary | ICD-10-CM

## 2014-12-30 LAB — CBC
HCT: 40 % (ref 36.0–46.0)
Hemoglobin: 12.8 g/dL (ref 12.0–15.0)
MCH: 29.4 pg (ref 26.0–34.0)
MCHC: 32 g/dL (ref 30.0–36.0)
MCV: 91.7 fL (ref 78.0–100.0)
Platelets: 223 10*3/uL (ref 150–400)
RBC: 4.36 MIL/uL (ref 3.87–5.11)
RDW: 14.1 % (ref 11.5–15.5)
WBC: 6.9 10*3/uL (ref 4.0–10.5)

## 2014-12-30 LAB — UIFE/LIGHT CHAINS/TP QN, 24-HR UR
% BETA, URINE: 13.7 %
ALPHA 1 URINE: 2.4 %
ALPHA 2 UR: 3.8 %
Albumin, U: 68.2 %
Free Kappa/Lambda Ratio: 9.14 (ref 2.04–10.37)
Free Lambda Lt Chains,Ur: 3.95 mg/L (ref 0.24–6.66)
Free Lt Chn Excr Rate: 36.1 mg/L — ABNORMAL HIGH (ref 1.35–24.19)
GAMMA GLOBULIN URINE: 11.8 %
TOTAL PROTEIN, URINE-UPE24: 15 mg/dL

## 2014-12-30 LAB — VITAMIN D 25 HYDROXY (VIT D DEFICIENCY, FRACTURES): VIT D 25 HYDROXY: 66.8 ng/mL (ref 30.0–100.0)

## 2014-12-30 LAB — BASIC METABOLIC PANEL
Anion gap: 7 (ref 5–15)
BUN: 29 mg/dL — ABNORMAL HIGH (ref 6–20)
CALCIUM: 9.3 mg/dL (ref 8.9–10.3)
CO2: 25 mmol/L (ref 22–32)
CREATININE: 1.21 mg/dL — AB (ref 0.44–1.00)
Chloride: 108 mmol/L (ref 101–111)
GFR calc Af Amer: 48 mL/min — ABNORMAL LOW (ref >60)
GFR calc non Af Amer: 42 mL/min — ABNORMAL LOW (ref >60)
GLUCOSE: 106 mg/dL — AB (ref 65–99)
Potassium: 3.5 mmol/L (ref 3.5–5.1)
Sodium: 140 mmol/L (ref 135–145)

## 2014-12-30 MED ORDER — METHIMAZOLE 5 MG PO TABS
15.0000 mg | ORAL_TABLET | Freq: Three times a day (TID) | ORAL | Status: DC
Start: 2014-12-30 — End: 2015-02-04

## 2014-12-30 MED ORDER — LORATADINE 10 MG PO TABS: 10.0000 mg | ORAL_TABLET | Freq: Every day | ORAL | Status: AC | PRN

## 2014-12-30 MED ORDER — CYANOCOBALAMIN 1000 MCG/ML IJ SOLN
1000.0000 ug | Freq: Once | INTRAMUSCULAR | Status: DC
  Filled 2014-12-30: qty 1

## 2014-12-30 MED ORDER — RISPERIDONE 0.5 MG PO TABS: 0.5000 mg | ORAL_TABLET | Freq: Every day | ORAL | Status: DC

## 2014-12-30 MED ORDER — ENOXAPARIN SODIUM 40 MG/0.4ML ~~LOC~~ SOLN
40.0000 mg | SUBCUTANEOUS | Status: DC
Start: 2014-12-30 — End: 2014-12-30
  Filled 2014-12-30: qty 0.4

## 2014-12-30 MED ORDER — VITAMIN B-12 1000 MCG PO TABS: 1000.0000 ug | ORAL_TABLET | Freq: Every day | ORAL | Status: AC

## 2014-12-30 NOTE — Clinical Social Work Placement (Signed)
   CLINICAL SOCIAL WORK PLACEMENT  NOTE  Date:  12/30/2014  Patient Details  Name: [name] MRN: [id number] Date of Birth: September 10, 1934  Clinical Social Work is seeking post-discharge placement for this patient at the [place] level of care (*CSW will initial, date and re-position this form in  chart as items are completed):  Yes   Patient/family provided with [place] Work Department's list of facilities offering this level of care within the geographic area requested by the patient (or if unable, by the patient's family).  Yes   Patient/family informed of their freedom to choose among providers that offer the needed level of care, that participate in Medicare, Medicaid or managed care program needed by the patient, have an available bed and are willing to accept the patient.  Yes   Patient/family informed of [facility]'s ownership interest in [facility] and [facility], as well as of the fact that they are under no obligation to receive care at these facilities.  PASRR submitted to EDS on 12/29/14     PASRR number received on 12/30/14     Existing PASRR number confirmed on       FL2 transmitted to all facilities in geographic area requested by pt/family on 12/29/14     FL2 transmitted to all facilities within larger geographic area on       Patient informed that his/her managed care company has contracts with or will negotiate with certain facilities, including the following:        Yes   Patient/family informed of bed offers received.  Patient chooses bed at [place], [place]     Physician recommends and patient chooses bed at      Patient to be transferred to [place], [place] on  .  Patient to be transferred to facility by       Patient family notified on   of transfer.  Name of family member notified:        PHYSICIAN       Additional Comment:  12/30/14-[facility] bed unavailable until 7/6. Dtr did not  want to choose alternative facility who had immediate opening so she plans to take patient home and will admit patient to [facility] on 7/6.  _______________________________________________ Boone Master, LCSW 12/30/2014, 4:51 PM

## 2014-12-30 NOTE — Discharge Summary (Addendum)
Physician Discharge Summary  [name] LJQ:[id number] DOB: 02-Feb-1935 DOA: 12/24/2014  PCP: Tracie Forts, MD  Admit date: 12/24/2014 Discharge date: 12/30/2014  Time spent: 50 minutes  Recommendations for Outpatient Follow-up:  1. I have stopped Vit D and Calcium, Started Methimazole, vit B12, stopped  Cozaar   2. UPEP still pending- please follow up 3. Vit D panel pending- please follow up 4. Needs neuropsych testing as outpt  Discharge Condition: stable Diet recommendation: low sodium, heart healthy  Discharge Diagnoses:  Principal Problem:   AKI (acute kidney injury) Active Problems:   Hypercalcemia   Dementia   Hyperthyroidism   CAD (coronary artery disease)   Hyperlipidemia   Lung nodule   Acquired obstructive hydrocephalus   Acute encephalopathy   Essential hypertension, benign   Narcotic induced mental alteration- increase confusion   History of present illness:  [name] is a 80 y.o. female past medical history of coronary artery disease, hypertension, hyperlipidemia, hydrocephalus status post VP shunt last year, hypercalcemia, lung nodule which the patient does not want worked up and paranoia. The patient's daughter gives a history today. She states that the patient has been increasingly confused for the past 2 weeks. An MRI was performed by her PCP and no acute issues were seen on this MRI. The daughter has been checking on her mother regularly over this 2 wk period and is making sure that she is taking her medications appropriately. She brought her into the hospital when she was found half naked out in her yard. She was found to have acute renal failure and hypercalcemia.   Hospital Course:  Principal Problem:  AKI (acute kidney injury) -Improving daily with hydration and holding Losartan- now back to baseline -UA checked and mild proteinuria noted- checked pro/cr ratio does not reveal significant proteinuria   - SPEP negative- UPEP  pending  Active Problems: Hypercalcemia -Chronic hypercalcemia with acute exacerbation -calcium 12.5 on admission- reviewed labs from last year- calcium levels were 10.5-11.4- she was maintained on calcium and vitamin D supplements as outpatient which I have discontinued -SPEP negative - UPEP pending - intact PTH is normal  - PTH related peptide is also normal - follow up on Vit D panel -With IV hydration, sodium approved from 12.5-10 but did not improve further -Have significantly given Lasix IV and give a dose of pamidronate-calcium now normalized  Acute encephalopathy - Acute encephalopathy is likely related to acute renal failure and acute exacerbation of chronic hypercalcemia and has resolved as of 7/4    h/o paranoia with symptoms of seeing people in her house - He is been on Risperdal for paranoia (likely dementia)  - other underlying causes may be untreated hyperthyroidism and possibe vit B12 deficiency (low normal levels- MMA not checked)  - B12 low normal at 326- will replace s/c with 1000 mcg and then daily with oral replacment  - RPR negative -now that acute encephalopathy has resolved, will need a neuropsych eval to determine the cause of dementia - daughter in agreement   Hyperthyroidism -This is progressive since 1 year ago when lab work revealed hyperthyroidism - started Methimazole- daughter has made her an appt with endocrine for August  Ref. Range 11/20/2013 22:25 11/21/2013 04:28 12/15/2014 17:04  TSH Latest Ref Range: 0.350-4.500 uIU/mL 0.033 (L)  0.019 (L)  Free T4 Latest Ref Range: 0.80-1.80 ng/dL  1.93 (H) 2.19 (H)  T3, Free Latest Ref Range: 2.3-4.2 pg/mL  3.4      Lung nodule -did not want this worked  up in the past-    Acquired obstructive hydrocephalus -has a VP shunt which appears to be functioning appropriately based on recent MRI and CT scans   Essential hypertension, benign -Continue Lopressor-hold losartan due to  renal failure  Chest pain -7/2 AM--patient was quite restless tachycardic and hypertensive with this pain-Resolved with Dilaudid-first troponin mildly elevated-note: troponin was elevated on 6/29 as well but as she was not having any pain, it was not pursued or repeated- - EKG unrevealing- 2 more sets of troponin negative  Hypokalemia - replaced  Mild hypernatremia  -Probably secondary to normal saline infusion- changed to half-normal saline with improvement        Discharge Exam: Filed Weights   12/24/14 2004  Weight: 55.339 kg (122 lb)   Filed Vitals:   12/30/14 1000  BP: 127/51  Pulse: 84  Temp: 97.3 F (36.3 C)  Resp: 18    General: [place]- often confused to time and place in the AM which improves by afternoon, no distress Cardiovascular: RRR, no murmurs  Respiratory: clear to auscultation bilaterally GI: soft, non-tender, non-distended, bowel sound positive  Discharge Instructions You were cared for by a hospitalist during your hospital stay. If you have any questions about your discharge medications or the care you received while you were in the hospital after you are discharged, you can call the unit and asked to speak with the hospitalist on call if the hospitalist that took care of you is not available. Once you are discharged, your primary care physician will handle any further medical issues. Please note that NO REFILLS for any discharge medications will be authorized once you are discharged, as it is imperative that you return to your primary care physician (or establish a relationship with a primary care physician if you do not have one) for your aftercare needs so that they can reassess your need for medications and monitor your lab values.  Discharge Instructions    Diet - low sodium heart healthy    Complete by:  As directed      Increase activity slowly    Complete by:  As directed             Medication List    STOP taking these medications         calcium citrate 950 MG tablet  Commonly known as:  CALCITRATE - dosed in mg elemental calcium     cholecalciferol 1000 UNITS tablet  Commonly known as:  VITAMIN D     ezetimibe 10 MG tablet- was not taking this at home  Commonly known as:  ZETIA     losartan 100 MG tablet  Commonly known as:  COZAAR      TAKE these medications        aspirin 81 MG tablet  Take 81 mg by mouth daily.     loratadine 10 MG tablet  Commonly known as:  CLARITIN  Take 1 tablet (10 mg total) by mouth daily as needed for allergies.     methimazole 5 MG tablet  Commonly known as:  TAPAZOLE  Take 3 tablets (15 mg total) by mouth 3 (three) times daily.     metoprolol succinate 50 MG 24 hr tablet  Commonly known as:  TOPROL-XL  Take 1 tablet (50 mg total) by mouth daily.     multivitamin with minerals Tabs tablet  Take 1 tablet by mouth daily.     pravastatin 40 MG tablet  Commonly known as:  PRAVACHOL  Take 1  tablet (40 mg total) by mouth daily.     risperiDONE 0.5 MG tablet  Commonly known as:  RISPERDAL  Take 1 tablet (0.5 mg total) by mouth at bedtime.     vitamin B-12 1000 MCG tablet  Commonly known as:  CYANOCOBALAMIN  Take 1 tablet (1,000 mcg total) by mouth daily.        Allergies  Allergen Reactions  . Sulfa Antibiotics Swelling  . Codeine Nausea And Vomiting  . Other Nausea And Vomiting    Some type of pain pill given for allergic reaction    Follow-up Information    Follow up On 01/06/2015.   Why:  Rounding physician at SNF will see patient       The results of significant diagnostics from this hospitalization (including imaging, microbiology, ancillary and laboratory) are listed below for reference.    Significant Diagnostic Studies: Dg Chest 2 View  12/24/2014   CLINICAL DATA:  Confusion for 2 weeks, worse today. Unsteady gait. Hypertension. Initial encounter.  EXAM: CHEST  2 VIEW  COMPARISON:  CT 11/21/2013.  Radiographs 11/20/2013.  FINDINGS: The heart size and  mediastinal contours are stable with mild aortic tortuosity. There is stable mild atelectasis or scarring at the left lung base. The lungs are otherwise clear. Ventriculoperitoneal shunt catheter overlies the right chest. Superior endplate compression deformity at L1 appears grossly stable.  IMPRESSION: Stable chest without evidence of acute cardiopulmonary process.   Electronically Signed   By: Richardean Sale M.D.   On: 12/24/2014 21:11   Ct Head Wo Contrast  12/28/2014   CLINICAL DATA:  Hydrocephalus, status post VP shunt last year. Hypercalcemia. Confusion.  EXAM: CT HEAD WITHOUT CONTRAST  TECHNIQUE: Contiguous axial images were obtained from the base of the skull through the vertex without intravenous contrast.  COMPARISON:  12/24/2014 head CT and 12/17/2014 brain MR.  FINDINGS: Sinuses/Soft tissues: Minimal mucosal thickening of the right sphenoid sinus. Other paranasal sinuses and mastoid air cells clear.  Intracranial: VP shunt catheter from right frontal approach terminates in the left lateral ventricle, similar.  Moderate low density in the periventricular white matter likely related to small vessel disease. Similar mild ventriculomegaly, felt to be secondary to advanced cerebral and cerebellar atrophy. Remote lacunar infarct in the left side of the thalamus on image 15. A smaller remote lacune is suspected in the right thalamus.  Calcified lesion in the fourth ventricle is grossly similar at 1.6 cm. No acute infarct, hemorrhage, intra-axial, or extra-axial fluid collection.  IMPRESSION: 1. No significant change since 12/17/2014. 2. Fourth ventricular lesion, as detailed on 12/17/2014 MRI. 3. VP shunt catheter in place with similar mild ventriculomegaly, felt to be secondary to cerebral atrophy. 4. Moderate small vessel ischemic change.   Electronically Signed   By: Abigail Miyamoto M.D.   On: 12/28/2014 10:52   Ct Head Wo Contrast  12/24/2014   CLINICAL DATA:  Altered mental status, hypertension  EXAM:  CT HEAD WITHOUT CONTRAST  TECHNIQUE: Contiguous axial images were obtained from the base of the skull through the vertex without intravenous contrast.  COMPARISON:  Six/ 22/16  FINDINGS: No skull fracture is noted. Again noted a right frontal ventriculostomy catheter with tip in left frontal horn. Stable cerebral atrophy. Again noted periventricular and patchy subcortical chronic white matter disease. Ventricular size is stable from prior exam. Stable small lacunar infarct bilateral thalamus. No acute cortical infarction. Stable vascular calcified lesion at the fourth ventricle.  No intracranial hemorrhage, mass effect or midline shift.  IMPRESSION:  No acute intracranial abnormality. Stable right frontal ventriculostomy catheter position with tip in left frontal horn. Stable atrophy and chronic white matter disease. No definite acute cortical infarction.   Electronically Signed   By: Lahoma Crocker M.D.   On: 12/24/2014 21:18   Mr Brain Wo Contrast  12/17/2014   CLINICAL DATA:  Multiple falls. Ventriculostomy placed 1 year ago for hydrocephalus.  EXAM: MRI HEAD WITHOUT CONTRAST  TECHNIQUE: Multiplanar, multiecho pulse sequences of the brain and surrounding structures were obtained without intravenous contrast.  COMPARISON:  MRI brain 11/21/2013.  FINDINGS: A right frontal ventriculostomy catheter crosses midline and enters the left frontal horn. Ventricular size is slightly decreased compared to the prior exam. There is no evidence for hydrocephalus.  A remote bilateral thalamic infarcts are again noted. Additional remote lacunar infarcts are again noted within the right basal ganglia. Is again noted. Confluent periventricular and subcortical T2 hyperintensities bilaterally are similar to the prior study.  Flow is present in the major intracranial arteries. Bilateral lens replacements are noted. Paranasal sinuses and mastoid air cells are clear.  Extensive white matter changes extend into the brainstem.  Multiple  punctate foci of susceptibility are present throughout the brain.  A focal soft tissue lesion is again noted within the fourth ventricle, measuring 18 mm in transverse diameter, not significantly changed. This is not obstructing.  Skullbase is within normal limits.  Midline structures are normal.  IMPRESSION: 1. Interval placement of ventriculostomy catheter as described above. There is no hydrocephalus. 2. Heterogeneous lesion within the fourth ventricle is stable. Spray represent take choroid plexus papilloma or less likely a vascular malformation or dermoid. 3. No acute intracranial abnormality. 4. Extensive white matter disease and remote lacunar infarcts. 5. Numerous foci of susceptibility. This is nonspecific, but most commonly seen in the setting of amyloid angiopathy.   Electronically Signed   By: San Morelle M.D.   On: 12/17/2014 18:47   Dg Chest Port 1 View  12/28/2014   CLINICAL DATA:  Confusion.  Chest rales.  EXAM: PORTABLE CHEST - 1 VIEW  COMPARISON:  12/24/2014  FINDINGS: Right-sided VP shunt catheter. Surgical clips in the right upper quadrant. Remote left humeral head trauma suspected. Midline trachea. Mild cardiomegaly with a tortuous thoracic aorta. No pleural effusion or pneumothorax. No congestive failure. Mild atelectasis or scarring at both lung bases.  IMPRESSION: No acute cardiopulmonary disease.  Cardiomegaly without congestive failure.  Aortic atherosclerosis.   Electronically Signed   By: Abigail Miyamoto M.D.   On: 12/28/2014 11:12    Microbiology: Recent Results (from the past 240 hour(s))  Urine culture     Status: None   Collection Time: 12/24/14  8:37 PM  Result Value Ref Range Status   Specimen Description URINE, CATHETERIZED  Final   Special Requests NONE  Final   Culture   Final    NO GROWTH 2 DAYS Performed at [facility]    Report Status 12/26/2014 FINAL  Final     Labs: Basic Metabolic Panel:  Recent Labs Lab 12/26/14 0533 12/27/14 0630  12/28/14 0558 12/29/14 0530 12/30/14 0414  NA 143 144 140 140 140  K 3.1* 3.8 3.7 3.8 3.5  CL 110 111 107 106 108  CO2 24 18* 23 25 25   GLUCOSE 92 153* 122* 98 106*  BUN 34* 22* 23* 28* 29*  CREATININE 1.95* 1.40* 1.27* 1.20* 1.21*  CALCIUM 10.5* 10.9* 9.9 9.6 9.3  MG 1.8  --   --   --   --  Liver Function Tests:  Recent Labs Lab 12/24/14 2023  AST 43*  ALT 22  ALKPHOS 114  BILITOT 0.9  PROT 7.0  ALBUMIN 3.9   No results for input(s): LIPASE, AMYLASE in the last 168 hours. No results for input(s): AMMONIA in the last 168 hours. CBC:  Recent Labs Lab 12/24/14 2023 12/25/14 0600 12/28/14 0558 12/29/14 0530 12/30/14 0414  WBC 13.1* 11.4* 8.3 7.6 6.9  HGB 14.7 14.0 13.5 13.2 12.8  HCT 45.3 43.2 42.0 41.1 40.0  MCV 89.5 91.3 89.6 90.5 91.7  PLT 236 228 212 225 223   Cardiac Enzymes:  Recent Labs Lab 12/24/14 2023 12/27/14 0630 12/27/14 1555 12/27/14 2150  TROPONINI 0.05* 0.04* 0.03 <0.03   BNP: BNP (last 3 results) No results for input(s): BNP in the last 8760 hours.  ProBNP (last 3 results) No results for input(s): PROBNP in the last 8760 hours.  CBG: No results for input(s): GLUCAP in the last 168 hours.     SignedDebbe Odea, MD [facility] 12/30/2014, 12:38 PM

## 2014-12-30 NOTE — Progress Notes (Signed)
Clinical Social Work  Dtr called and reported she spoke with [facility] who is agreeable to accept patient but no beds available until 7/6. CSW spoke with dtr re: DC orders being placed and patient needing to DC today. Patient's dtr plans to get assistance from friends and family and will take patient home tonight and then she will be admitted to [facility] tomorrow. Dtr reports she will come shortly to pick up patient. CSW updated RN.  [place], [place] [phone]

## 2014-12-30 NOTE — Progress Notes (Signed)
OT Cancellation Note  Patient Details Name: [name] MRN: [id number] DOB: [birth year]   Cancelled Treatment:    Reason Eval/Treat Not Completed: Patient declined, no reason specified  Jaber Dunlow 12/30/2014, 3:09 PM  Lesle Chris, OTR/L [phone] 12/30/2014

## 2014-12-30 NOTE — Progress Notes (Addendum)
Clinical Social Work  CSW left messages with dtr re: when she will come to transport patient home. DC packet prepared with DC summary, FL2, and hard scripts included. RN to provide dtr with DC packet. DC summary faxed to [facility] and dtr has completed paperwork with SNF for 7/6 admission. Dtr is aware that patient is ready to DC today and reports she will pick up patient as soon as arrangements are made at home for 24/7 care. CSW offered to get information for private duty caregivers but dtr reports friends and family will assist.  CSW encouraged RN to continue to call dtr and can contact ED SW Marye Round [phone]) if further assistance is needed after hours.  Sindy Messing,  [phone]  Addendum [phone] Dtr called and reports she is on her way to pick up patient. CSW informed RN. CSW is signing off.

## 2014-12-30 NOTE — Progress Notes (Signed)
Clinical Social Work  CSW met with patient and dtr at bedside to provide bed offers. Patient's dtr is unsatisfied with offers and reports that CSW is not doing enough to assist her. CSW explained bed search process to dtr again and that SNFs are privately owned and make offers as they please. Dtr is upset and reports she read reviews online and no offers would take care of patient. CSW encouraged dtr to go and tour facilities to get a better sense if she felt comfortable at a facility. CSW spoke with dtr about transferring to alternative facility if she did not like the one that was chosen from DC. Dtr reports that CSW is not helpful and that she would be better off taking patient home.   Dtr left the room crying and reported she did not want to upset patient. CSW met with dtr in the hallway. Dtr is concerned about patient being home alone even after rehab. CSW spoke with dtr re: LT care at ALF. Dtr reports she knows that patient would not qualify for Medicaid because she owns her house. CSW encouraged dtr to talk with Department of Social Services if she had further questions about Medicaid.  Dtr asked for MD to cancel DC and hold orders until a facility offered a bed that she accepted. CSW offered to expand search to other counties but dtr does not want to drive farther to visit patient. CSW explained that patient is medically stable and will need to DC today. Dtr asked for CSW to give a few hours to make plans and talk with family. CSW agreeable to call dtr in a couple of hours.  [place], [place] [phone]

## 2014-12-30 NOTE — Care Management (Signed)
Important Message  Patient Details  Name: KADIA ABAYA MRN: [id number] Date of Birth: 05/07/1935   Medicare Important Message Given:       Camillo Flaming, [place] 12/30/2014, 1:53 PM

## 2014-12-30 NOTE — Progress Notes (Signed)
[name] to be D/C'd Home per MD order.  Discussed prescriptions and follow up appointments with the patient. Prescriptions given to patient, medication list explained in detail. Pt verbalized understanding.    Medication List    STOP taking these medications        calcium citrate 950 MG tablet  Commonly known as:  CALCITRATE - dosed in mg elemental calcium     cholecalciferol 1000 UNITS tablet  Commonly known as:  VITAMIN D     ezetimibe 10 MG tablet  Commonly known as:  ZETIA     losartan 100 MG tablet  Commonly known as:  COZAAR      TAKE these medications        aspirin 81 MG tablet  Take 81 mg by mouth daily.     loratadine 10 MG tablet  Commonly known as:  CLARITIN  Take 1 tablet (10 mg total) by mouth daily as needed for allergies.     methimazole 5 MG tablet  Commonly known as:  TAPAZOLE  Take 3 tablets (15 mg total) by mouth 3 (three) times daily.     metoprolol succinate 50 MG 24 hr tablet  Commonly known as:  TOPROL-XL  Take 1 tablet (50 mg total) by mouth daily.     multivitamin with minerals Tabs tablet  Take 1 tablet by mouth daily.     pravastatin 40 MG tablet  Commonly known as:  PRAVACHOL  Take 1 tablet (40 mg total) by mouth daily.     risperiDONE 0.5 MG tablet  Commonly known as:  RISPERDAL  Take 1 tablet (0.5 mg total) by mouth at bedtime.     vitamin B-12 1000 MCG tablet  Commonly known as:  CYANOCOBALAMIN  Take 1 tablet (1,000 mcg total) by mouth daily.        Filed Vitals:   12/30/14 1300  BP: 132/64  Pulse: 75  Temp: 97.1 F (36.2 C)  Resp: 20    Skin clean, dry and intact without evidence of skin break down, no evidence of skin tears noted. IV catheter discontinued intact. Site without signs and symptoms of complications. Dressing and pressure applied. Pt denies pain at this time. No complaints noted.  An After Visit Summary was printed and given to the patient. Patient escorted via [place], and D/C home via private  auto.  [name] 12/30/2014 5:36 PM

## 2014-12-30 NOTE — Progress Notes (Signed)
Patient refused all of her 10 am medications, stating "Life is about choices, I choose not to take my medications because we (referring to her and her daughter) are trying to eliminate medications". Will try to give medications again once daughter returns.

## 2014-12-30 NOTE — Progress Notes (Signed)
SLP Cancellation Note  Patient Details Name: [name] MRN: [id number] DOB: 01-Feb-1935   Cancelled treatment:       Reason Eval/Treat Not Completed: Other (comment) (pt to dc today per orders, recommend follow up SLP)   Luanna Salk, [place] [facility] SLP [phone]

## 2014-12-31 ENCOUNTER — Telehealth: Payer: Self-pay

## 2014-12-31 DIAGNOSIS — R441 Visual hallucinations: Secondary | ICD-10-CM

## 2014-12-31 DIAGNOSIS — R410 Disorientation, unspecified: Secondary | ICD-10-CM

## 2014-12-31 DIAGNOSIS — F05 Delirium due to known physiological condition: Principal | ICD-10-CM

## 2014-12-31 NOTE — Telephone Encounter (Signed)
Patient's daughter [name] called to give Dr. Tamala Julian an update. She says the patient did get into [employer] and she had a much better day today. They said she is moving around very well, although her real assessment will be tomorrow. Daughter wants to thank Dr. Tamala Julian for her call earlier today. She says it really helped her to feel better about everything that's going on. Cb# [phone] (cell).

## 2015-01-01 NOTE — Telephone Encounter (Signed)
Noted.  Patient called on 12/31/14 at 7:30am through phone service. Spoke with daughter, [name].  Hospitalist recommended rehabilitation for patient due to deconditioning.  Pt was discharged home with daughter on 12/30/14.  Daughter is having difficulty getting pt placed due to concern of possible metastatic cancer that daughter is not aware of.  Daughter is also concerned of patient's delirium causing a problem with rehab stay.  Hospital records reviewed in detail prior to daughter's call.  Daughter wants to know if patient does have metastatic cancer so that Hospice can be consulted if necessary.  Hospitalist also recommended a neuropsychiatric evaluation.  A/P: recommend PET scan as recommended in 2015 to determine if patient suffering with malignancy; pt has refused work up of possible lung mass for the past year.  Recommend neuropsychiatric evaluation to address delirium.  Recommend Hospice or [facility] consultation if patient is denied admission for rehab.  Order for neuropsychology evaluation placed. Will wait on PET scan to allow pt to complete inpatient rehab.

## 2015-01-04 LAB — VITAMIN D 1,25 DIHYDROXY
VITAMIN D 1, 25 (OH) TOTAL: 135 pg/mL
VITAMIN D3 1, 25 (OH): 135 pg/mL

## 2015-01-07 ENCOUNTER — Emergency Department (HOSPITAL_COMMUNITY): Payer: Medicare Other

## 2015-01-07 ENCOUNTER — Encounter (HOSPITAL_COMMUNITY): Payer: Self-pay | Admitting: Emergency Medicine

## 2015-01-07 ENCOUNTER — Inpatient Hospital Stay (HOSPITAL_COMMUNITY): Payer: Medicare Other

## 2015-01-07 ENCOUNTER — Inpatient Hospital Stay (HOSPITAL_COMMUNITY)
Admission: EM | Admit: 2015-01-07 | Discharge: 2015-01-10 | DRG: 683 | Disposition: A | Discharge status: Skilled Nursing Facility | Payer: Medicare Other | Attending: Family Medicine | Admitting: Family Medicine

## 2015-01-07 DIAGNOSIS — R918 Other nonspecific abnormal finding of lung field: Secondary | ICD-10-CM | POA: Diagnosis not present

## 2015-01-07 DIAGNOSIS — R4182 Altered mental status, unspecified: Secondary | ICD-10-CM | POA: Diagnosis present

## 2015-01-07 DIAGNOSIS — Z7982 Long term (current) use of aspirin: Secondary | ICD-10-CM

## 2015-01-07 DIAGNOSIS — F22 Delusional disorders: Secondary | ICD-10-CM | POA: Diagnosis present

## 2015-01-07 DIAGNOSIS — R4189 Other symptoms and signs involving cognitive functions and awareness: Secondary | ICD-10-CM | POA: Insufficient documentation

## 2015-01-07 DIAGNOSIS — R911 Solitary pulmonary nodule: Secondary | ICD-10-CM | POA: Diagnosis present

## 2015-01-07 DIAGNOSIS — E0590 Thyrotoxicosis, unspecified without thyrotoxic crisis or storm: Secondary | ICD-10-CM | POA: Diagnosis present

## 2015-01-07 DIAGNOSIS — E785 Hyperlipidemia, unspecified: Secondary | ICD-10-CM | POA: Diagnosis present

## 2015-01-07 DIAGNOSIS — R739 Hyperglycemia, unspecified: Secondary | ICD-10-CM

## 2015-01-07 DIAGNOSIS — R404 Transient alteration of awareness: Secondary | ICD-10-CM | POA: Diagnosis not present

## 2015-01-07 DIAGNOSIS — N390 Urinary tract infection, site not specified: Secondary | ICD-10-CM | POA: Diagnosis present

## 2015-01-07 DIAGNOSIS — F0390 Unspecified dementia without behavioral disturbance: Secondary | ICD-10-CM | POA: Diagnosis not present

## 2015-01-07 DIAGNOSIS — I252 Old myocardial infarction: Secondary | ICD-10-CM | POA: Diagnosis not present

## 2015-01-07 DIAGNOSIS — N179 Acute kidney failure, unspecified: Principal | ICD-10-CM | POA: Diagnosis present

## 2015-01-07 DIAGNOSIS — G911 Obstructive hydrocephalus: Secondary | ICD-10-CM | POA: Diagnosis present

## 2015-01-07 DIAGNOSIS — F05 Delirium due to known physiological condition: Secondary | ICD-10-CM | POA: Diagnosis present

## 2015-01-07 DIAGNOSIS — B9620 Unspecified Escherichia coli [E. coli] as the cause of diseases classified elsewhere: Secondary | ICD-10-CM | POA: Diagnosis present

## 2015-01-07 DIAGNOSIS — R778 Other specified abnormalities of plasma proteins: Secondary | ICD-10-CM | POA: Insufficient documentation

## 2015-01-07 DIAGNOSIS — G9340 Encephalopathy, unspecified: Secondary | ICD-10-CM | POA: Diagnosis not present

## 2015-01-07 DIAGNOSIS — I2510 Atherosclerotic heart disease of native coronary artery without angina pectoris: Secondary | ICD-10-CM | POA: Diagnosis present

## 2015-01-07 DIAGNOSIS — R7989 Other specified abnormal findings of blood chemistry: Secondary | ICD-10-CM | POA: Insufficient documentation

## 2015-01-07 DIAGNOSIS — Z982 Presence of cerebrospinal fluid drainage device: Secondary | ICD-10-CM | POA: Diagnosis not present

## 2015-01-07 DIAGNOSIS — F17210 Nicotine dependence, cigarettes, uncomplicated: Secondary | ICD-10-CM | POA: Diagnosis present

## 2015-01-07 DIAGNOSIS — G919 Hydrocephalus, unspecified: Secondary | ICD-10-CM | POA: Diagnosis present

## 2015-01-07 DIAGNOSIS — I10 Essential (primary) hypertension: Secondary | ICD-10-CM | POA: Diagnosis present

## 2015-01-07 LAB — URINALYSIS, ROUTINE W REFLEX MICROSCOPIC
Glucose, UA: 100 mg/dL — AB
Ketones, ur: 15 mg/dL — AB
Leukocytes, UA: NEGATIVE
Nitrite: NEGATIVE
Protein, ur: >300 mg/dL — AB
Specific Gravity, Urine: 1.019 (ref 1.005–1.030)
Urobilinogen, UA: 1 mg/dL (ref 0.0–1.0)
pH: 5 (ref 5.0–8.0)

## 2015-01-07 LAB — COMPREHENSIVE METABOLIC PANEL
ALT: 16 U/L (ref 14–54)
AST: 21 U/L (ref 15–41)
Albumin: 3 g/dL — ABNORMAL LOW (ref 3.5–5.0)
Alkaline Phosphatase: 118 U/L (ref 38–126)
Anion gap: 14 (ref 5–15)
BUN: 14 mg/dL (ref 6–20)
CO2: 19 mmol/L — ABNORMAL LOW (ref 22–32)
Calcium: 9.5 mg/dL (ref 8.9–10.3)
Chloride: 104 mmol/L (ref 101–111)
Creatinine, Ser: 1.96 mg/dL — ABNORMAL HIGH (ref 0.44–1.00)
GFR calc Af Amer: 27 mL/min — ABNORMAL LOW (ref >60)
GFR calc non Af Amer: 23 mL/min — ABNORMAL LOW (ref >60)
Glucose, Bld: 325 mg/dL — ABNORMAL HIGH (ref 65–99)
Potassium: 4.2 mmol/L (ref 3.5–5.1)
Sodium: 137 mmol/L (ref 135–145)
Total Bilirubin: 0.6 mg/dL (ref 0.3–1.2)
Total Protein: 6.4 g/dL — ABNORMAL LOW (ref 6.5–8.1)

## 2015-01-07 LAB — TROPONIN I
Troponin I: 0.04 ng/mL — ABNORMAL HIGH (ref <0.031)
Troponin I: 0.05 ng/mL — ABNORMAL HIGH (ref <0.031)
Troponin I: 0.06 ng/mL — ABNORMAL HIGH (ref <0.031)

## 2015-01-07 LAB — URINE MICROSCOPIC-ADD ON

## 2015-01-07 LAB — MRSA PCR SCREENING: MRSA BY PCR: NEGATIVE

## 2015-01-07 LAB — CBG MONITORING, ED: Glucose-Capillary: 262 mg/dL — ABNORMAL HIGH (ref 65–99)

## 2015-01-07 LAB — CBC
HCT: 36.3 % (ref 36.0–46.0)
Hemoglobin: 12.1 g/dL (ref 12.0–15.0)
MCH: 29.6 pg (ref 26.0–34.0)
MCHC: 33.3 g/dL (ref 30.0–36.0)
MCV: 88.8 fL (ref 78.0–100.0)
Platelets: 359 10*3/uL (ref 150–400)
RBC: 4.09 MIL/uL (ref 3.87–5.11)
RDW: 13.5 % (ref 11.5–15.5)
WBC: 25.8 10*3/uL — ABNORMAL HIGH (ref 4.0–10.5)

## 2015-01-07 LAB — TSH: TSH: 0.086 u[IU]/mL — ABNORMAL LOW (ref 0.350–4.500)

## 2015-01-07 LAB — T4, FREE: Free T4: 2.27 ng/dL — ABNORMAL HIGH (ref 0.61–1.12)

## 2015-01-07 MED ORDER — SODIUM CHLORIDE 0.9 % IV BOLUS (SEPSIS)
1000.0000 mL | Freq: Once | INTRAVENOUS | Status: AC
  Administered 2015-01-07: 1000 mL via INTRAVENOUS

## 2015-01-07 MED ORDER — SODIUM CHLORIDE 0.9 % IJ SOLN
3.0000 mL | Freq: Two times a day (BID) | INTRAMUSCULAR | Status: DC
Start: 2015-01-07 — End: 2015-01-10
  Administered 2015-01-10: 3 mL via INTRAVENOUS

## 2015-01-07 MED ORDER — RISPERIDONE 0.5 MG PO TABS
0.5000 mg | ORAL_TABLET | Freq: Every day | ORAL | Status: DC
  Administered 2015-01-07 – 2015-01-09 (×3): 0.5 mg via ORAL
  Filled 2015-01-07 (×4): qty 1

## 2015-01-07 MED ORDER — METOPROLOL SUCCINATE ER 50 MG PO TB24
50.0000 mg | ORAL_TABLET | Freq: Every day | ORAL | Status: DC
  Administered 2015-01-07 – 2015-01-10 (×4): 50 mg via ORAL
  Filled 2015-01-07 (×4): qty 1

## 2015-01-07 MED ORDER — CEFTRIAXONE SODIUM IN DEXTROSE 20 MG/ML IV SOLN
1.0000 g | INTRAVENOUS | Status: DC
  Administered 2015-01-07 – 2015-01-08 (×2): 1 g via INTRAVENOUS
  Filled 2015-01-07 (×3): qty 50

## 2015-01-07 MED ORDER — SODIUM CHLORIDE 0.9 % IV SOLN
INTRAVENOUS | Status: DC
  Administered 2015-01-07 – 2015-01-10 (×5): via INTRAVENOUS

## 2015-01-07 MED ORDER — PRAVASTATIN SODIUM 40 MG PO TABS
40.0000 mg | ORAL_TABLET | Freq: Every day | ORAL | Status: DC
  Administered 2015-01-07 – 2015-01-10 (×4): 40 mg via ORAL
  Filled 2015-01-07 (×4): qty 1

## 2015-01-07 MED ORDER — ASPIRIN 81 MG PO CHEW
81.0000 mg | CHEWABLE_TABLET | Freq: Every day | ORAL | Status: DC
  Administered 2015-01-07 – 2015-01-10 (×4): 81 mg via ORAL
  Filled 2015-01-07 (×5): qty 1

## 2015-01-07 NOTE — ED Notes (Signed)
Pt daughter at bedside and states that [place] states that pt was found unresponsive in wheelchair this morning with head tilted back and reports that when pt became responsive with PT they tried to stand her up and she walked into the wall with her walker. Pt denies any complaints still at this time, Dr. Wilson Singer at bedside.

## 2015-01-07 NOTE — ED Notes (Signed)
Patient is resting comfortably. 

## 2015-01-07 NOTE — ED Notes (Signed)
Escorted patient to restroom for urine specimen.  Patient unable to void at this time but was able to have BM.  Helped patient back to bed. Xray tech waiting to transport patient to xray

## 2015-01-07 NOTE — ED Notes (Signed)
Patient transported to X-ray 

## 2015-01-07 NOTE — Progress Notes (Signed)
Rocephin per pharmacy for UTI  75 YOF from SNF presented with AMS, pharmacy is consulted to start rocephin for possible UTI. Tm 99.6, wbc 25.8. Urine culture is pending.  Plan: Rocephin 1g IV Q 24 hrs Pharmacy sign off.  Thanks!  Maryanna Shape, PharmD, BCPS  Clinical Pharmacist  Pager: [phone]

## 2015-01-07 NOTE — H&P (Signed)
[facility] Admission History and Physical Service Pager: [phone]  Patient name: [name] Medical record number: [id number] Date of birth: 02-19-1935 Age: 80 y.o. Gender: female  Primary Care Provider: Reginia Forts, MD Consultants: none Code Status: CNR (per her document)  Chief Complaint: AMS  Assessment and Plan: [name] is a 80 y.o. female presenting with AMS from SNF. PMH is significant for dementia, CAD, acquired obstructive hydrocephalus s/p VP shunt in 2015, paranoia, hyperthyroidism, hypercalcemia, hypertension, lung nodule and narcotic induced mental alteration.  AMS: Pt with hx of dementia and paranoia. Baseline unknown. UTI is likely given her UA with many bacteria although there are no nitrites and LE. UCx is pending. Patient is not septic. She doesn't appear to have respiratory symptoms. Shunt malfunction is possible. Head CT on 7/3 with mild ventriculomegally and moderate small vessel ischemic changes. Less likely to be CVA given no neurologic deficit. Medication SE is possible. She is on methimazole since last hospitalization which could cause drowsiness. Hypothyroidism is also a possibility. She is also on risperidone which could cause sedation and drowsiness. We can not rule out meningitis or encephalitis although there is no recorded fever. Vit B12 level WNL on 7/2. CMP WNL except for bicarb of 19 and creatinine of 1.96. Ca 9.5 on admission. Patient has normal levels of attention on our interview, making acute delirium less likely, though patient may have waxing/waning course. Will obtain head CT to rule out bleed given history of recent falls. Suspect underlying psychiatric etiology given delusions about "unclean blood," though no psychiatric diagnosis found on EMR.  - Admit to floor - CT head w/o contrast - consider LP if fever - TSH/ T4 level - Holding methimazole - f/u urine cx - f/u Bcx - continue home resperidone -  Will need collateral information from family members or SNF to determine patient's baseline mental status  Bacteruria. UA with many bacteria, few sqaums, negative nitrites, negarive leukocytes. May be potential source of patient's AMS. Has significant leukocytosis, though no fevers or CVA tenderness suggestive of pyelonephritis.  - CTX (7/13- ) - Urine culture pending.   Elevated Troponin: 0.04>0.05> . EKG with slight ST depression. Patient has significant risk factors for ACS. HEART score of 5. Patient has CAD listed on problem list, but no further information was able to be obtained from patient.  - Cycle troponin  - EKG in the morning - Card consult in AM - Continue home aspirin and statin  Pulmonary Nodule. Incidentally found on CXR. Has history of RUL nodule found on CT in 2015 - Consider repeat chest CT  AKI: creatinine 1.96. Recent b/l about 1.2. Also granular cast which makes ATN likely. ATN is also possible given her elevated creatinine (1.96) and granular cast.  - IVF - CMP in AM  Hypertension: BP wnl. On losartan and metoprolol at home per EMR. - hold losartan given AKI - Continue home metoprolol  Hyperthyroidism: TSH on 0.019 on 6/20. Started on methimazole. - Free T4 and T3 level - continue her home methimazole for now as above   FEN/GI:  F: IV NS 100 ml/hr E: replete as needed N: Heart healthy carb modified Prophylaxis: SCDs until intracranial bleed ruled out   Disposition: floor  History of Present Illness: [name] is a 80 y.o. female presenting with AMS via EMS from [facility] SNF. PMH is significant for dementia, CAD, acquired obstructive hydrocephalus s/p VP shunt in 2015, paranoia, hyperthyroidism, hypercalcemia, hypertension, lung nodule and narcotic induced mental  alteration. History is limited due to patient's mental status.   Patient is awake but not able to give appropriate hx. We tried to call her daughter but she doesn't answer her phone.    Patient oriented to self, person and year but not to day, month and place. She thinks she lives "here". States that she lives in the hospital and she was in the hospital this morning. States that she has a "blood problem".  She says, "Someboy's got a problem," "got the wrong blood," "been dealing with people who have unclean blood," and  "that somebody is not like the rest of [place]."  Also reports that "family members got some problem with their blood". Says that she has been falling "enough to get someones attention" . She "doesn't know" if someone is out to hurt her. Able to spell world forwards, not backwards  She denies pain, fever, chills, dysuria, chest pain, SOB, abdominal pain, numbness, weakness.    Of note, patient was hospitalized from 12/24/2014 to 12/30/2014 at [facility] IM service with AMS secondary to hypercalcemia and ARF.  ED course: WBC of 25.8, sCr 1.96, glucose 325, troponin 0.04>0.05>  UA: turbid with many bacteria, granular cast, 15 ketones, > 300 proteins. CXR w/o acute process.  Review Of Systems: Per HPI, Otherwise 12 point review of systems was performed and was unremarkable.  Patient Active Problem List   Diagnosis Date Noted  . Hyperthyroidism 12/30/2014  . Narcotic induced mental alteration- increase confusion 12/29/2014  . Acute kidney injury 12/24/2014  . AKI (acute kidney injury) 12/24/2014  . Acute encephalopathy 12/24/2014  . Dementia 12/24/2014  . Essential hypertension, benign 12/24/2014  . Hydrocephalus 12/20/2013  . Auditory hallucinations 12/16/2013  . Visual hallucinations 12/16/2013  . Acquired obstructive hydrocephalus 12/16/2013  . Posterior fossa tumor 12/16/2013  . Delirium 11/21/2013  . Syncope 11/21/2013  . Lung nodule 11/21/2013  . Hypercalcemia 01/22/2012  . Hyperlipidemia 01/22/2012  . Tobacco user 01/22/2012  . HTN (hypertension), benign 01/13/2012  . Vitamin D deficiency 01/13/2012  . Osteoporosis 01/13/2012  . CAD (coronary artery  disease) 01/13/2012   Past Medical History: Past Medical History  Diagnosis Date  . Hyperlipidemia   . Essential hypertension, benign   . CAD (coronary artery disease)   . Osteoporosis   . Hypercalcemia   . Vitamin D deficiency   . Ovarian tumor   . Breast tumor   . MI (myocardial infarction) July 1995    Treated with LAD PCI- Dr Adora Fridge  . Cataract   . Complication of anesthesia   . PONV (postoperative nausea and vomiting)   . Lung tumor     recently found approx 25 days ago-Kristen Tamala Julian, MD   Past Surgical History: Past Surgical History  Procedure Laterality Date  . Angioplasty    . Hip pinning    . Breast lumpectomy    . Abdominal hysterectomy    . Cholecystectomy    . Fracture surgery  Nov 2003    L distal radius fx- Dr. Frederik Pear  . Eye surgery      Cataract removal  . Cardiac catheterization      1990's  . Ventriculoperitoneal shunt Right 12/20/2013    Procedure: SHUNT INSERTION VENTRICULAR-PERITONEAL;  Surgeon: Winfield Cunas, MD;  Location: [place] [facility] OR[facility];  Service: Neurosurgery;  Laterality: Right;  VP shunt insertion  . Doppler echocardiography  2010   Social History: History  Substance Use Topics  . Smoking status: Current Some Day Smoker -- 1.00 packs/day for  60 years    Types: Cigarettes  . Smokeless tobacco: Not on file  . Alcohol Use: No   Additional social history: Please also refer to relevant sections of EMR.  Family History: Family History  Problem Relation Age of Onset  . Stroke Mother   . Heart disease Father   . Cancer Sister    Allergies and Medications: Allergies  Allergen Reactions  . Sulfa Antibiotics Swelling  . Codeine Nausea And Vomiting  . Other Nausea And Vomiting    Some type of pain pill given for allergic reaction    No current facility-administered medications on file prior to encounter.   Current Outpatient Prescriptions on File Prior to Encounter  Medication Sig Dispense Refill  . aspirin 81 MG tablet Take 81  mg by mouth daily.    Marland Kitchen loratadine (CLARITIN) 10 MG tablet Take 1 tablet (10 mg total) by mouth daily as needed for allergies.    . methimazole (TAPAZOLE) 5 MG tablet Take 3 tablets (15 mg total) by mouth 3 (three) times daily.    . metoprolol succinate (TOPROL-XL) 50 MG 24 hr tablet Take 1 tablet (50 mg total) by mouth daily. 30 tablet 9  . Multiple Vitamin (MULTIVITAMIN WITH MINERALS) TABS tablet Take 1 tablet by mouth daily. 30 tablet 0  . pravastatin (PRAVACHOL) 40 MG tablet Take 1 tablet (40 mg total) by mouth daily. 30 tablet 10  . risperiDONE (RISPERDAL) 0.5 MG tablet Take 1 tablet (0.5 mg total) by mouth at bedtime. 30 tablet 5  . vitamin B-12 (CYANOCOBALAMIN) 1000 MCG tablet Take 1 tablet (1,000 mcg total) by mouth daily. 30 tablet 0    Objective: BP 142/59 mmHg  Pulse 90  Temp(Src) 99.6 F (37.6 C) (Rectal)  Resp 21  Ht 5\' 4"  (1.626 m)  Wt 119 lb 9.6 oz (54.25 kg)  BMI 20.52 kg/m2  SpO2 99% Exam: Gen: No acute distress. Alert, cooperative with exam.  HEENT: Atraumatic, EOMI, PERRLA, Oropharynx clear. MMM CV: RRR. Several ectopic beats noted. No murmurs, rubs, or gallops noted. 2+ radial and DP pulses bilaterally. Resp: CTAB. No wheezing, crackles, or rhonchi noted. Abd: +BS. Soft, non-distended, non-tender. No rebound or guarding. No CVA tenderness Ext: No edema. No gross deformities. Neuro: Alert, oriented to self, year and person, CN2-12 intact. Strength 5/5 in upper and lower extremities. Sensation to light touch grossly intact bilaterally.  PSYCH: Blunted affected. Perseverative on "unclean blood." No AVH. No obvious paranoia.   Labs and Imaging: CBC BMET   Recent Labs Lab 01/07/15 1153  WBC 25.8*  HGB 12.1  HCT 36.3  PLT 359    Recent Labs Lab 01/07/15 1153  NA 137  K 4.2  CL 104  CO2 19*  BUN 14  CREATININE 1.96*  GLUCOSE 325*  CALCIUM 9.5     Urinalysis    Component Value Date/Time   COLORURINE AMBER* 01/07/2015 1352   APPEARANCEUR TURBID*  01/07/2015 1352   LABSPEC 1.019 01/07/2015 1352   PHURINE 5.0 01/07/2015 1352   GLUCOSEU 100* 01/07/2015 1352   HGBUR MODERATE* 01/07/2015 1352   BILIRUBINUR SMALL* 01/07/2015 1352   BILIRUBINUR Negative 12/15/2014 1734   KETONESUR 15* 01/07/2015 1352   PROTEINUR >300* 01/07/2015 1352   PROTEINUR 30 12/15/2014 1734   UROBILINOGEN 1.0 01/07/2015 1352   UROBILINOGEN 0.2 12/15/2014 1734   NITRITE NEGATIVE 01/07/2015 1352   NITRITE Negative 12/15/2014 1734   LEUKOCYTESUR NEGATIVE 01/07/2015 1352      Recent Labs Lab 01/07/15 1158 01/07/15 1549  TROPONINI  0.04* 0.05*    Dg Chest 2 View  01/07/2015   IMPRESSION: 1. 1 cm peripheral left mid lung opacity may be a new pulmonary nodule. The patient also had a previous pulmonary nodule in the right upper lobe on a CT scan from may 2015 which warrants follow up at this time in order to assess for any change. Accordingly, I do recommend chest CT both to evaluate this new finding and to reassess the old finding. 2. Atherosclerotic aortic arch. 3. Subsegmental atelectasis or scar peripherally at the left lung base. 4. Chronic deformity of the left proximal humerus from old healed fracture.   Electronically Signed   By: Van Clines M.D.   On: 01/07/2015 13:29   EKG: NSR, 87mm ST depression in V4-V6, no PVCs or PACs noted  Wendee Beavers, MD 01/07/2015, 3:32 PM PGY-1, [place] Intern pager: [phone], text pages welcome  I have read the above note and made revisions highlighted in blue.  Algis Greenhouse. Jerline Pain, [place] Resident PGY-2 01/07/2015 6:59 PM

## 2015-01-07 NOTE — ED Notes (Signed)
daughter requests to be called upon pt receiving room assignment [phone] [facility]

## 2015-01-07 NOTE — Progress Notes (Signed)
Patient admitted from ED to 6e16. Transported via stretcher and EMT. Skin assessed. No problems except some bruising to left hand. Alert and oriented to self (name only). NSL to R hand intact. Telemetry monitor placed on patient. No family at bedside. VSS. MD aware of patient room assignment. Will conitnue to monitor. Bartholomew Crews, RN

## 2015-01-07 NOTE — ED Notes (Signed)
Per EMS: pt from [facility] for eval of "lethargy" this morning, upon arrival EMS noted pt to be alert and oriented, denies any complaints. Pt states she was up all night last night and wanted to sleep in today, staff at the facility tried to get the patient up for PT and stated pt was more tired than normal. Pt pleasantly confused upon arrival, nad noted.

## 2015-01-07 NOTE — ED Notes (Signed)
Family at bedside. 

## 2015-01-07 NOTE — ED Notes (Signed)
Kizzie Fantasia RN notified of CBG 262

## 2015-01-08 ENCOUNTER — Inpatient Hospital Stay (HOSPITAL_COMMUNITY): Payer: Medicare Other

## 2015-01-08 DIAGNOSIS — R778 Other specified abnormalities of plasma proteins: Secondary | ICD-10-CM | POA: Insufficient documentation

## 2015-01-08 DIAGNOSIS — R918 Other nonspecific abnormal finding of lung field: Secondary | ICD-10-CM | POA: Insufficient documentation

## 2015-01-08 DIAGNOSIS — R4182 Altered mental status, unspecified: Secondary | ICD-10-CM

## 2015-01-08 DIAGNOSIS — R7989 Other specified abnormal findings of blood chemistry: Secondary | ICD-10-CM

## 2015-01-08 DIAGNOSIS — G9340 Encephalopathy, unspecified: Secondary | ICD-10-CM

## 2015-01-08 DIAGNOSIS — R911 Solitary pulmonary nodule: Secondary | ICD-10-CM | POA: Insufficient documentation

## 2015-01-08 DIAGNOSIS — N179 Acute kidney failure, unspecified: Principal | ICD-10-CM

## 2015-01-08 DIAGNOSIS — R404 Transient alteration of awareness: Secondary | ICD-10-CM | POA: Insufficient documentation

## 2015-01-08 LAB — COMPREHENSIVE METABOLIC PANEL
ALBUMIN: 2.3 g/dL — AB (ref 3.5–5.0)
ALT: 12 U/L — AB (ref 14–54)
AST: 16 U/L (ref 15–41)
Alkaline Phosphatase: 95 U/L (ref 38–126)
Anion gap: 8 (ref 5–15)
BILIRUBIN TOTAL: 0.3 mg/dL (ref 0.3–1.2)
BUN: 21 mg/dL — AB (ref 6–20)
CALCIUM: 8.2 mg/dL — AB (ref 8.9–10.3)
CHLORIDE: 107 mmol/L (ref 101–111)
CO2: 24 mmol/L (ref 22–32)
Creatinine, Ser: 1.75 mg/dL — ABNORMAL HIGH (ref 0.44–1.00)
GFR calc Af Amer: 31 mL/min — ABNORMAL LOW (ref >60)
GFR calc non Af Amer: 27 mL/min — ABNORMAL LOW (ref >60)
Glucose, Bld: 125 mg/dL — ABNORMAL HIGH (ref 65–99)
Potassium: 3.9 mmol/L (ref 3.5–5.1)
SODIUM: 139 mmol/L (ref 135–145)
TOTAL PROTEIN: 5.4 g/dL — AB (ref 6.5–8.1)

## 2015-01-08 LAB — CBC
HEMATOCRIT: 27.2 % — AB (ref 36.0–46.0)
Hemoglobin: 8.8 g/dL — ABNORMAL LOW (ref 12.0–15.0)
MCH: 28.7 pg (ref 26.0–34.0)
MCHC: 32.4 g/dL (ref 30.0–36.0)
MCV: 88.6 fL (ref 78.0–100.0)
Platelets: 276 10*3/uL (ref 150–400)
RBC: 3.07 MIL/uL — ABNORMAL LOW (ref 3.87–5.11)
RDW: 13.9 % (ref 11.5–15.5)
WBC: 13.5 10*3/uL — ABNORMAL HIGH (ref 4.0–10.5)

## 2015-01-08 LAB — TROPONIN I
Troponin I: 0.05 ng/mL — ABNORMAL HIGH (ref <0.031)
Troponin I: 0.05 ng/mL — ABNORMAL HIGH (ref <0.031)

## 2015-01-08 LAB — GLUCOSE, CAPILLARY: Glucose-Capillary: 125 mg/dL — ABNORMAL HIGH (ref 65–99)

## 2015-01-08 MED ORDER — ENSURE ENLIVE PO LIQD
237.0000 mL | Freq: Two times a day (BID) | ORAL | Status: DC
  Administered 2015-01-08 – 2015-01-10 (×4): 237 mL via ORAL

## 2015-01-08 MED ORDER — CEFTRIAXONE SODIUM IN DEXTROSE 20 MG/ML IV SOLN: 1.0000 g | INTRAVENOUS | Status: DC

## 2015-01-08 MED ORDER — HEPARIN SODIUM (PORCINE) 5000 UNIT/ML IJ SOLN
5000.0000 [IU] | Freq: Three times a day (TID) | INTRAMUSCULAR | Status: DC
Start: 2015-01-08 — End: 2015-01-10
  Administered 2015-01-08 – 2015-01-10 (×7): 5000 [IU] via SUBCUTANEOUS
  Filled 2015-01-08 (×8): qty 1

## 2015-01-08 MED ORDER — INSULIN ASPART 100 UNIT/ML ~~LOC~~ SOLN
0.0000 [IU] | Freq: Three times a day (TID) | SUBCUTANEOUS | Status: DC
  Administered 2015-01-08 – 2015-01-10 (×3): 1 [IU] via SUBCUTANEOUS

## 2015-01-08 MED ORDER — INSULIN ASPART 100 UNIT/ML ~~LOC~~ SOLN: 0.0000 [IU] | Freq: Every day | SUBCUTANEOUS | Status: DC

## 2015-01-08 NOTE — Progress Notes (Signed)
I have seen and examined this patient with Dr Juanito Doom. I have reviewed labs and imaging results.  I have discussed with Dr Juanito Doom.  I agree with their findings and plans as documented in their progress note.  Information for this note obtained from Patient, Dgt [name]) and Review of EMR  Acute Issues 1. Episodes of transient decreased Level of Consciousness followed by transient confusion, recurrent problem over last 2 to three weeks.  - Episodes of anomic dysphasia that Dgt believes precedes the episodes of transient decreased LOC then confusion.  - Dgt reports patient has been complaining of brief episodes of bilateral vision loss - Falls associated with episodes of confusion - Episodes have occurred while seated.   - Dr Quay Burow is her cardiologist. Hs of LAD angioplasty in 1995. Myoview normal in 2010. Pre-op (ventricular shunt placement)  TTE was unremarkable. Pt was cleared for surgery without further work-up for active IHD.  - Dgt reports that Dr Reginia Forts, patient's PCP, performed a MMSE 12/15/14 with patient scoring 28/30.  No MMSE found in EMR flowsheet. Patient continues to manage her own finances, household, medications, and ADLs.    Patient had Masters Degree. Dgt is a Management consultant.  - Ventriculostomy shunt placed 12/21/13 by Dr Raliegh Ip. Cabbell for hydrocephalus. Patient with known posterior fossa tumor diagnosed in 2010.  Patient's hydrocephalus manifested in wandering behavior with amnesia.  - No witnessed convulsive events.  No history of seizures. - No new medications started nor significant medications stopped in transfer from hospital to [place] home where patient was admitted for PT for falls.  - Recent MRI (12/17/14) with (+) bi-thalamic lacunar infarcts, Right Basal Ganglia infarct, Ventriculostomy catheter into left lateral ventricle, Lesion within the fourth ventricle, extensive white matter changes  - Neuro exam today was none focal except for impaired  standing balance.  No tremor, normal muscle tone.  Negative upper tract signs.  Transfer from Bed to chair with standing and short steps did not require assistance.  Pt was alert, cooperative, able to follow requests.  - Head CT for confusion without acute changes.    2. Visual and Auditory Hallucinations - Organized hallucinations of seeing and hearing others in her home.  - associated paranoid delusions of theft and intruders in home - No reported history of major psychiatric disorder prior to the onset of these psychotic features about 4 years ago according to the dgt.  - Pt has seen psychiatry in past for visual and auditory hallucinations.  - Pt on Risperidone for about a year for symptoms.  There was an increase in Risperidone from 0.25 mg to 0.5 mg about a month ago.   3. Pulmonary Nodules - Found RUL spiculated nodule on CXR in May of 2015, confirmed with  CTA.  In several consultations with her PCP, Ms Greenley was declined further workup of lesion. - CXR 01/07/15 showed new possible nodule in left mid-lung field.  - recent episode of hypercalcemia 12/24/14 thru 12/30/14 hospitalization at [facility] for falls and confusion. Calcium levels normalizaed with pamidronate IV x 1.  Confusion resolved per the dgt prior to discharge home.  Current corrected calcium is 9.6 mg/dL.   4. Falls - Multifactorial origen including the recurrent episodes of decr LOC with subsequent transient confusion - Patient was in SNF at [facility] for PT for gait and strength therapy per dgt.  This is where patient was after discharge starting ~ 01/01/15.   5. Hyperthyroidism - Working diagnosis is an autoimmune thyroiditis, like  Graves D.  - recent hypercalcemia - Methimazole 15 mg daily started during recent hospitalization 6/29 - 12/30/14. Pt is on metoprolol ER 50 mg daily.  Heart rates mostly in 90s, below 110 BPM.  6. Acute Kidney Injury - Suspect pre-renal origin  Recommend -  Consultation with Neurology:  Recurrent episodes of decreased LOC with subsequent confusion in patient with hx of lacunar and BG strokes, hydrocephalus s/p VP shunt, and posterior fossa tumor. - CT chest without contrast to risk stratify pulmonary nodules for malignancy.  - Continue Risperidone at home dose.  If patient does not have a significant decline in cognitive function on MMSE testing per dgt and is not dependent in iADLs or ADLs, I am not comfortable concurring that the patient has dementia.  We should repeat the MMSE or MoCA before patient leave hospital to see if we get the same results as reported by the dgt.  - Continue PT inhouse for gait training, balance training and proximal muscle strengthening.  Anticipate return to NH to complete PT.

## 2015-01-08 NOTE — Progress Notes (Signed)
Initial Nutrition Assessment  DOCUMENTATION CODES:   Not applicable  INTERVENTION:   Provide Ensure Enlive po BID, each supplement provides 350 kcal and 20 grams of protein.  Encourage adequate PO intake.  NUTRITION DIAGNOSIS:   Inadequate oral intake related to lethargy/confusion as evidenced by meal completion < 25%.  GOAL:   Patient will meet greater than or equal to 90% of their needs  MONITOR:   PO intake, Supplement acceptance, Weight trends, Labs, I & O's  REASON FOR ASSESSMENT:   Consult Poor PO  ASSESSMENT:   80 y.o. female presenting with AMS from SNF. PMH is significant for dementia, CAD, acquired obstructive hydrocephalus s/p VP shunt in 2015, paranoia, hyperthyroidism, hypercalcemia, hypertension, lung nodule and narcotic induced mental alteration.  Pt reports her appetite is fine currently and PTA with consumption of 2-3 meals a day with an ensure on occasion. Weight has been stable. Current meal completion has been 0-25%. Pt is agreeable to Ensure to aid in caloric and protein needs. RD to order. Pt was encouraged to eat her food at meals and to drink her supplements.   NFPE completed. Pt with no significant fat or muscle mass loss, except for mild muscle mass loss in the clavicle region which is most likely associated with the natural aging process.   Labs: Low calcium, ALT, and GFR. High BUN and creatinine.   Diet Order:  Diet heart healthy/carb modified Room service appropriate?: Yes; Fluid consistency:: Thin  Skin:  Reviewed, no issues  Last BM:  7/13  Height:   Ht Readings from Last 1 Encounters:  01/07/15 5\' 4"  (1.626 m)    Weight:   Wt Readings from Last 1 Encounters:  01/08/15 131 lb 6.4 oz (59.603 kg)    Ideal Body Weight:  54.5 kg  Wt Readings from Last 10 Encounters:  01/08/15 131 lb 6.4 oz (59.603 kg)  12/24/14 122 lb (55.339 kg)  12/15/14 122 lb 12.8 oz (55.702 kg)  10/08/14 125 lb 3.2 oz (56.79 kg)  09/25/14 127 lb 1.6 oz  (57.652 kg)  07/02/14 124 lb (56.246 kg)  04/02/14 123 lb 9.6 oz (56.065 kg)  01/13/14 116 lb 12.8 oz (52.98 kg)  12/30/13 117 lb 3.2 oz (53.162 kg)  12/21/13 129 lb 11.2 oz (58.832 kg)    BMI:  Body mass index is 22.54 kg/(m^2).  Estimated Nutritional Needs:   Kcal:  1650-1850  Protein:  70-85 grams  Fluid:  1.6 - 1.8 L/day  EDUCATION NEEDS:   No education needs identified at this time  Corrin Parker, MS, RD, LDN Pager # [phone] After hours/ weekend pager # [phone]

## 2015-01-08 NOTE — Care Management Note (Signed)
Case Management Note  Patient Details  Name: [name] MRN: [id number] Date of Birth: 1935/05/29  Subjective/Objective:                 CM following for progression and d/c planning.   Action/Plan: Pt is resident of [facility], CSW, V Crawford aware of pt and will work with plan to return to SNF.   Expected Discharge Date:       01/10/2015           Expected Discharge Plan:  Skilled Nursing Facility  In-House Referral:  Clinical Social Work  Discharge planning Services  CM Consult  Post Acute Care Choice:  NA Choice offered to:  NA  DME Arranged:    DME Agency:     HH Arranged:    [place] Agency:     Status of Service:  Completed, signed off  Medicare Important Message Given:    Date Medicare IM Given:    Medicare IM give by:    Date Additional Medicare IM Given:    Additional Medicare Important Message give by:     If discussed at [place] of Stay Meetings, dates discussed:    Additional Comments:  Adron Bene, RN 01/08/2015, 10:45 AM

## 2015-01-08 NOTE — Progress Notes (Signed)
Patient was showing vent bigeminy, paged the TS and made aware of the situation, VS normal.  No signs or symptoms noted.

## 2015-01-08 NOTE — Progress Notes (Signed)
Inpatient Diabetes Program Recommendations  AACE/ADA: New Consensus Statement on Inpatient Glycemic Control (2013)  Target Ranges:  Prepandial:   less than 140 mg/dL      Peak postprandial:   less than 180 mg/dL (1-2 hours)      Critically ill patients:  140 - 180 mg/dL   Results for JAYCELYNN, KNICKERBOCKER (MRN [id number]) as of 01/08/2015 12:02  Ref. Range 01/07/2015 11:53 01/08/2015 03:00  Glucose Latest Ref Range: 65-99 mg/dL 325 (H) 125 (H)    Diabetes history: No Outpatient Diabetes medications: NA Current orders for Inpatient glycemic control: None  Inpatient Diabetes Program Recommendations Correction (SSI): Initial random glucose 325 mg/dl on 7/13 at 11:53 and fasting glucose 125 mg/dl this morning. While inpatient, may want to consider ordering CBGs with Novolog sensitive correction scale if needed. HgbA1C: Please consider ordering an A1C to evaluate glycemic control over the past 2-3 months.  Thanks, Barnie Alderman, RN, MSN, CCRN, CDE Diabetes Coordinator Inpatient Diabetes Program [phone] (Team Pager from [place] to [place]) [phone] ([place] office) [phone] [facility] office) [phone] [facility] office)

## 2015-01-08 NOTE — Consult Note (Signed)
NEURO HOSPITALIST CONSULT NOTE   Referring physician: Nori Riis   Reason for Consult: AMS  HPI:                                                                                                                                          [name] is an 80 y.o. female who was recently seen in the hospital on 12/24/2014 for increased confusion. Per note at that time she has had a decline in her memory over the past year.  During that visit she had a CT scan which was negative fo acute findings, HIV, RPR, B12 and VIt D which were all normal. Patient has known paranoia and in other notes mention of dementia.  Patient was brought to hospital from SNF after they noted increased AMS.  On arrival UA showed many bacteria but no Nitrites and leukocytes. Repeat head CT showed no new intracranial abnormality.  Currently she is unable to give me significant amount of history.  She does admit her memory has been declining.  She does not feel it has significantly declined over the past few days.  She is aware she is in the hospital and tells me she lives at home with her cats and daughter (when asked if she was at a nursing home she denies this).  Neurology was asked to see patient for her confusion.   On arrival: Cr 1.96 Glucose 325 WBC 25 Urine culture 50,000 E. Coli TSH 0.086 T4 2.27   Past Medical History  Diagnosis Date  . Hyperlipidemia   . Essential hypertension, benign   . CAD (coronary artery disease)   . Osteoporosis   . Hypercalcemia   . Vitamin D deficiency   . Ovarian tumor   . Breast tumor   . MI (myocardial infarction) July 1995    Treated with LAD PCI- Dr Adora Fridge  . Cataract   . Complication of anesthesia   . PONV (postoperative nausea and vomiting)   . Lung tumor     recently found approx 25 days Simone Curia, MD    Past Surgical History  Procedure Laterality Date  . Angioplasty    . Hip pinning    . Breast lumpectomy    . Abdominal hysterectomy     . Cholecystectomy    . Fracture surgery  Nov 2003    L distal radius fx- Dr. Frederik Pear  . Eye surgery      Cataract removal  . Cardiac catheterization      1990's  . Ventriculoperitoneal shunt Right 12/20/2013    Procedure: SHUNT INSERTION VENTRICULAR-PERITONEAL;  Surgeon: Winfield Cunas, MD;  Location: [facility];  Service: Neurosurgery;  Laterality: Right;  VP shunt insertion  . Doppler echocardiography  2010    Family History  Problem Relation Age of Onset  . Stroke Mother   . Heart disease Father   . Cancer Sister       Social History:  reports that she has been smoking Cigarettes.  She has a 60 pack-year smoking history. She does not have any smokeless tobacco history on file. She reports that she does not drink alcohol or use illicit drugs.  Allergies  Allergen Reactions  . Sulfa Antibiotics Swelling  . Codeine Nausea And Vomiting  . Hydrocodone Other (See Comments)    Confusion  . Other Nausea And Vomiting    Some type of pain pill given for allergic reaction     MEDICATIONS:                                                                                                                     Prior to Admission:  Prescriptions prior to admission  Medication Sig Dispense Refill Last Dose  . aspirin 81 MG tablet Take 81 mg by mouth daily.   01/07/2015 at Unknown time  . loratadine (CLARITIN) 10 MG tablet Take 1 tablet (10 mg total) by mouth daily as needed for allergies.   01/07/2015 at Unknown time  . methimazole (TAPAZOLE) 5 MG tablet Take 3 tablets (15 mg total) by mouth 3 (three) times daily.   01/07/2015 at Unknown time  . metoprolol succinate (TOPROL-XL) 50 MG 24 hr tablet Take 1 tablet (50 mg total) by mouth daily. 30 tablet 9 01/07/2015 at 0900  . Multiple Vitamin (MULTIVITAMIN WITH MINERALS) TABS tablet Take 1 tablet by mouth daily. 30 tablet 0 01/07/2015 at Unknown time  . pravastatin (PRAVACHOL) 40 MG tablet Take 1 tablet (40 mg total) by mouth daily. 30  tablet 10 01/06/2015  . risperiDONE (RISPERDAL) 0.5 MG tablet Take 1 tablet (0.5 mg total) by mouth at bedtime. 30 tablet 5 01/06/2015  . vitamin B-12 (CYANOCOBALAMIN) 1000 MCG tablet Take 1 tablet (1,000 mcg total) by mouth daily. 30 tablet 0 01/07/2015 at Unknown time   Scheduled: . aspirin  81 mg Oral Daily  . cefTRIAXone (ROCEPHIN)  IV  1 g Intravenous Q24H  . feeding supplement (ENSURE ENLIVE)  237 mL Oral BID BM  . heparin subcutaneous  5,000 Units Subcutaneous 3 times per day  . insulin aspart  0-5 Units Subcutaneous QHS  . insulin aspart  0-9 Units Subcutaneous TID WC  . metoprolol succinate  50 mg Oral Daily  . pravastatin  40 mg Oral Daily  . risperiDONE  0.5 mg Oral QHS  . sodium chloride  3 mL Intravenous Q12H     ROS:  History obtained from the patient  General ROS: negative for - chills, fatigue, fever, night sweats, weight gain or weight loss Psychological ROS: negative for - behavioral disorder, hallucinations, memory difficulties, mood swings or suicidal ideation Ophthalmic ROS: negative for - blurry vision, double vision, eye pain or loss of vision ENT ROS: negative for - epistaxis, nasal discharge, oral lesions, sore throat, tinnitus or vertigo Allergy and Immunology ROS: negative for - hives or itchy/watery eyes Hematological and Lymphatic ROS: negative for - bleeding problems, bruising or swollen lymph nodes Endocrine ROS: negative for - galactorrhea, hair pattern changes, polydipsia/polyuria or temperature intolerance Respiratory ROS: negative for - cough, hemoptysis, shortness of breath or wheezing Cardiovascular ROS: negative for - chest pain, dyspnea on exertion, edema or irregular heartbeat Gastrointestinal ROS: negative for - abdominal pain, diarrhea, hematemesis, nausea/vomiting or stool incontinence Genito-Urinary ROS: negative  for - dysuria, hematuria, incontinence or urinary frequency/urgency Musculoskeletal ROS: negative for - joint swelling or muscular weakness Neurological ROS: as noted in HPI Dermatological ROS: negative for rash and skin lesion changes   Blood pressure 125/45, pulse 93, temperature 99.3 F (37.4 C), temperature source Oral, resp. rate 18, height 5\' 4"  (1.626 m), weight 59.603 kg (131 lb 6.4 oz), SpO2 93 %.   Neurologic Examination:                                                                                                      HEENT-  Normocephalic, no lesions, without obvious abnormality.  Normal external eye and conjunctiva.  Normal TM's bilaterally.  Normal auditory canals and external ears. Normal external nose, mucus membranes and septum.  Normal pharynx. Cardiovascular- S1, S2 normal, pulses palpable throughout   Lungs- chest clear, no wheezing, rales, normal symmetric air entry, Heart exam - Abdomen- normal findings: bowel sounds normal Extremities- no edema Lymph-no adenopathy palpable Musculoskeletal-no joint tenderness, deformity or swelling Skin-warm and dry, no hyperpigmentation, vitiligo, or suspicious lesions  Neurological Examination Mental Status: Alert, oriented to [place], July 2016. She is able to tell me there are 4 quarters in 2 dollars, able to name 7 animals in one minute and able to recall 1/3 objects. Speech fluent without evidence of aphasia.  Able to follow 3 step commands without difficulty. Cranial Nerves: II: Discs flat bilaterally; Visual fields grossly normal, pupils equal, round, reactive to light and accommodation III,IV, VI: ptosis not present, extra-ocular motions intact bilaterally V,VII: smile symmetric, facial light touch sensation normal bilaterally VIII: hearing normal bilaterally IX,X: uvula rises symmetrically XI: bilateral shoulder shrug XII: midline tongue extension Motor: Right : Upper extremity   5/5    Left:     Upper extremity    5/5  Lower extremity   5/5     Lower extremity   5/5 Tone and bulk:normal tone throughout; no atrophy noted Sensory: Pinprick and light touch intact throughout, bilaterally Deep Tendon Reflexes: 2+ and symmetric throughout UE, 1+ bilateral KJ and no AJ Plantars: Right: downgoing   Left: downgoing Cerebellar: normal finger-to-nose, and normal heel-to-shin test Gait: not able to assess due to safety  Lab Results: Basic Metabolic Panel:  Recent Labs Lab 01/07/15 1153 01/08/15 0300  NA 137 139  K 4.2 3.9  CL 104 107  CO2 19* 24  GLUCOSE 325* 125*  BUN 14 21*  CREATININE 1.96* 1.75*  CALCIUM 9.5 8.2*    Liver Function Tests:  Recent Labs Lab 01/07/15 1153 01/08/15 0300  AST 21 16  ALT 16 12*  ALKPHOS 118 95  BILITOT 0.6 0.3  PROT 6.4* 5.4*  ALBUMIN 3.0* 2.3*   No results for input(s): LIPASE, AMYLASE in the last 168 hours. No results for input(s): AMMONIA in the last 168 hours.  CBC:  Recent Labs Lab 01/07/15 1153 01/08/15 0300  WBC 25.8* 13.5*  HGB 12.1 8.8*  HCT 36.3 27.2*  MCV 88.8 88.6  PLT 359 276    Cardiac Enzymes:  Recent Labs Lab 01/07/15 1158 01/07/15 1549 01/07/15 2020 01/08/15 0300 01/08/15 0906  TROPONINI 0.04* 0.05* 0.06* 0.05* 0.05*    Lipid Panel: No results for input(s): CHOL, TRIG, HDL, CHOLHDL, VLDL, LDLCALC in the last 168 hours.  CBG:  Recent Labs Lab 01/07/15 1217  GLUCAP 262*    Microbiology: Results for orders placed or performed during the hospital encounter of 01/07/15  Urine culture     Status: None (Preliminary result)   Collection Time: 01/07/15  2:33 PM  Result Value Ref Range Status   Specimen Description URINE, CLEAN CATCH  Final   Special Requests NONE  Final   Culture 50,000 COLONIES/mL ESCHERICHIA COLI  Final   Report Status PENDING  Incomplete  MRSA PCR Screening     Status: None   Collection Time: 01/07/15  5:31 PM  Result Value Ref Range Status   MRSA by PCR NEGATIVE NEGATIVE Final     Comment:        The GeneXpert MRSA Assay (FDA approved for NASAL specimens only), is one component of a comprehensive MRSA colonization surveillance program. It is not intended to diagnose MRSA infection nor to guide or monitor treatment for MRSA infections.   Culture, blood (routine x 2)     Status: None (Preliminary result)   Collection Time: 01/07/15  8:10 PM  Result Value Ref Range Status   Specimen Description BLOOD LEFT ANTECUBITAL  Final   Special Requests BOTTLES DRAWN AEROBIC AND ANAEROBIC 5CC EACH  Final   Culture PENDING  Incomplete   Report Status PENDING  Incomplete    Coagulation Studies: No results for input(s): LABPROT, INR in the last 72 hours.  Imaging: Dg Chest 2 View  01/07/2015   CLINICAL DATA:  Altered awareness.  Lethargy.  EXAM: CHEST  2 VIEW  COMPARISON:  12/28/2014  FINDINGS: Shunt tubing projects over the right chest without visible discontinuity.  Atherosclerotic aortic arch with mild aortic tortuosity. Heart size currently normal.  Chronic deformity left proximal humerus from old healed fracture.  Dextroconvex upper lumbar scoliosis.  Suspected emphysema.  Mild mid thoracic wedging, similar to prior.  1.0 by 0.9 cm density peripherally in the left mid lung, admittedly projecting over rib and scapula.  Linear subsegmental atelectasis or scarring along the left hemidiaphragm.  IMPRESSION: 1. 1 cm peripheral left mid lung opacity may be a new pulmonary nodule. The patient also had a previous pulmonary nodule in the right upper lobe on a CT scan from may 2015 which warrants follow up at this time in order to assess for any change. Accordingly, I do recommend chest CT both to evaluate this new finding and to reassess the old finding.  2. Atherosclerotic aortic arch. 3. Subsegmental atelectasis or scar peripherally at the left lung base. 4. Chronic deformity of the left proximal humerus from old healed fracture.   Electronically Signed   By: Van Clines M.D.    On: 01/07/2015 13:29   Ct Head Wo Contrast  01/07/2015   CLINICAL DATA:  Altered mental status. Hydrocephalus with VP shunt catheter. Intraventricular mass.  EXAM: CT HEAD WITHOUT CONTRAST  TECHNIQUE: Contiguous axial images were obtained from the base of the skull through the vertex without intravenous contrast.  COMPARISON:  CT on 12/28/2014 and MRI on 12/17/2014  FINDINGS: Right frontal ventriculostomy remains in place with tip in the left frontal horn. Hydrocephalus is stable since previous study.  Small calcified mass in the inferior aspect of the fourth ventricle is stable.  Mild cerebral atrophy and moderate to severe chronic small vessel disease is stable in appearance. Old bilateral thalamic lacunar infarcts are unchanged.  There is no evidence of intracranial hemorrhage, brain edema, or other signs of acute infarction. There is no evidence of mass effect. No abnormal extraaxial fluid collections are identified. No skull abnormality identified.  IMPRESSION: Stable small calcified mass in the inferior aspect of the fourth ventricle. Stable hydrocephalus with ventriculostomy.  No new intracranial abnormality identified.  Stable cerebral atrophy, chronic small vessel disease, and old bilateral thalamic lacunar infarcts.   Electronically Signed   By: Earle Gell M.D.   On: 01/07/2015 18:57    Etta Quill PA-C [facility] [phone]  01/08/2015, 2:32 PM  Patient seen and examined.  Clinical course and management discussed.  Necessary edits performed.  I agree with the above.  Assessment and plan of care developed and discussed below.    Assessment/Plan: 80 year old female with a progressive decline in cognition over the past year.  Imaging has been unremarkable and includes a MRI of the brain in June of this year and a head CT performed on this admission.  Shunt is noted to be in place with no development of hydrocephalus and no acute abnormalities noted.  It should be noted though  that the patient currently has a urinary tract infection (white blood cell count elevated and culture positive).  Patient also with an elevated creatinine above baseline.  Thyroid function testing from June of this year in addition was abnormal.  All of  These factors may affect cognition individually and collectively.  She therefore may have an underlying dementia that is being worsened by these other confounding factors.  Etiology likely multifactorial.  Would address those concerns that can be improved upon.  It should be kept in mind that her cognitive function may lag behind her improvement in lab data.   Recommendations: 1.  Agree with addressing UTI, renal function and thyroid abnormalities. 2.  Patient may continue follow up with neurology on an outpatient basis.      Alexis Goodell, MD [facility] [phone]  01/08/2015  4:56 PM

## 2015-01-08 NOTE — Progress Notes (Signed)
Family Medicine Teaching Service Daily Progress Note Intern Pager: [phone]  Patient name: [name] Medical record number: [id number] Date of birth: 04-21-1935 Age: 80 y.o. Gender: female  Primary Care Provider: Reginia Forts, MD Consultants: none Code Status: CNR (per her document)   Assessment and Plan: [name] is a 80 y.o. female presenting with AMS from SNF. PMH is significant for dementia, CAD, acquired obstructive hydrocephalus s/p VP shunt in 2015, paranoia, hyperthyroidism, hypercalcemia, hypertension, lung nodule and narcotic induced mental alteration.  AMS: Pt with hx of dementia and paranoia. Baseline unknown. UTI is likely given her UA with many bacteria although there are no nitrites and LE. UCx is pending. Patient is not septic. She doesn't appear to have respiratory symptoms. Shunt malfunction is possible. Head CT on 7/3 with mild ventriculomegally and moderate small vessel ischemic changes. Less likely to be CVA given no neurologic deficit. Medication SE is possible. She is on methimazole since last hospitalization which could cause drowsiness. Hypothyroidism is also a possibility. She is also on risperidone which could cause sedation and drowsiness. We can not rule out meningitis or encephalitis although there is no recorded fever. Vit B12 level WNL on 7/2. CMP WNL except for bicarb of 19 and creatinine of 1.96. Ca 9.5 on admission. Patient has normal levels of attention on our interview, making acute delirium less likely, though patient may have waxing/waning course. Will obtain head CT to rule out bleed given history of recent falls. Suspect underlying psychiatric etiology given delusions about "unclean blood," though no psychiatric diagnosis found on EMR.  - Admit to floor - CT head w/o contrast: no acute changes - consider LP if fever - TSH/ T4 level - Holding methimazole - urine cx: 50,000 colonies of E. Coli - Bcx pending - continue home resperidone -  Patient's daughter, [name], provides history of patient [phone]) - f/u with Neurology consult   Bacteruria. UA with many bacteria, few sqaums, negative nitrites, negarive leukocytes. May be potential source of patient's AMS. Has significant leukocytosis, though no fevers or CVA tenderness suggestive of pyelonephritis.  - Urine culture: 50,000 colonies of E. Coli   Elevated Troponin: 0.06>0.05>0.05 . EKG with slight ST depression. Patient has significant risk factors for ACS. HEART score of 5. Patient has CAD listed on problem list, but no further information was able to be obtained from patient.  - Troponin mildly elevated but remains stable. - EKG unchanged - Card consult  - Continue home aspirin and statin  Pulmonary Nodule. Incidentally found on CXR. Has history of RUL nodule found on CT in 2015 - Consider repeat CXR  AKI: creatinine 1.96. Recent b/l about 1.2. Also granular cast which makes ATN likely. ATN is also possible given her elevated creatinine (1.96) and granular cast.  - IVF - CMP -Cr 1.96>>1.75  Hypertension: BP wnl. On losartan and metoprolol at home per EMR. - hold losartan given AKI - Continue home metoprolol  Hyperthyroidism: TSH on 0.019 on 6/20. Started on methimazole. - Free T4 and T3 level - continue her home methimazole for now as above   FEN/GI:  F: IV NS 100 ml/hr E: replete as needed N: Heart healthy carb modified Prophylaxis: Heparin   Disposition: stable  Subjective:  Patient was seen earlier this morning by myself. Patient was in NAD, awake, and alert laying comfortably in bed. She was not responding to any of my questions and did not look at me when calling her name. Her daughter [name]) was in the room with her  and stated her mother hasn't responded to her all morning. I believed she was still in [place].  Later in the morning Dr. McDiarmid and I went to see the patient again. She was then responsive to questions and interacted with  everyone in the room. Her affect and mood had vastly improved from seeing her earlier this morning. We had a thorough conversation with her daughter who revealed that this change in behavior is something that is new that all started while in the SNF the last couple days. Typically the patient is very alert and oriented and even scored 28 on her MMSE 2 weeks prior. The daughter also comments that her mother sees imaginary "people" in her house and thinks they live there. This delusion has been occuring for the last 4 years and apparently manifested from a family drama that occurred decades ago. Additionally, the patient has been calling objects by the wrong names. For example: the patient was given an umbrella and then said "thank you for the chair". Daughter states the patient has been able to maintain her bills and ADL but her signature has been looking slightly different on checks. The daughter said she is more than willing to move in with her mom in the future to help her.   Objective: Temp:  [98 F (36.7 C)-99.6 F (37.6 C)] 99.3 F (37.4 C) (07/14 0406) Pulse Rate:  [86-116] 94 (07/14 0406) Resp:  [16-25] 19 (07/14 0406) BP: (115-152)/(52-77) 129/62 mmHg (07/14 0406) SpO2:  [96 %-99 %] 97 % (07/14 0406) Weight:  [119 lb 8 oz (54.205 kg)-131 lb 6.4 oz (59.603 kg)] 131 lb 6.4 oz (59.603 kg) (07/14 0406) Physical Exam: Gen: No acute distress. Alert, cooperative with exam.  HEENT: Atraumatic, EOMI, PERRLA, Oropharynx clear. MMM CV: RRR. Several ectopic beats noted. No murmurs, rubs, or gallops noted. 2+ radial and DP pulses bilaterally. Resp: CTAB. No wheezing, crackles, or rhonchi noted. Abd: +BS. Soft, non-distended, non-tender. No rebound or guarding. No CVA tenderness Ext: No edema. No gross deformities. Neuro: Alert, oriented to self, year and person, CN2-12 intact. Strength 5/5 in upper and lower extremities. Sensation to light touch grossly intact bilaterally. Rapid alternating movements  and fine finger movements are intact. There is no dysmetria on finger-to-nose and heel-knee-shin. There are no abnormal or extraneous movements. Romberg is absent. Mild antalgic gait most likely due to weakness PSYCH: normal mood and slightly blunted affect  Laboratory:  Recent Labs Lab 01/07/15 1153 01/08/15 0300  WBC 25.8* 13.5*  HGB 12.1 8.8*  HCT 36.3 27.2*  PLT 359 276    Recent Labs Lab 01/07/15 1153 01/08/15 0300  NA 137 139  K 4.2 3.9  CL 104 107  CO2 19* 24  BUN 14 21*  CREATININE 1.96* 1.75*  CALCIUM 9.5 8.2*  PROT 6.4* 5.4*  BILITOT 0.6 0.3  ALKPHOS 118 95  ALT 16 12*  AST 21 16  GLUCOSE 325* 125*   Urinalysis    Component Value Date/Time   COLORURINE AMBER* 01/07/2015 1352   APPEARANCEUR TURBID* 01/07/2015 1352   LABSPEC 1.019 01/07/2015 1352   PHURINE 5.0 01/07/2015 1352   GLUCOSEU 100* 01/07/2015 1352   HGBUR MODERATE* 01/07/2015 1352   BILIRUBINUR SMALL* 01/07/2015 1352   BILIRUBINUR Negative 12/15/2014 1734   KETONESUR 15* 01/07/2015 1352   PROTEINUR >300* 01/07/2015 1352   PROTEINUR 30 12/15/2014 1734   UROBILINOGEN 1.0 01/07/2015 1352   UROBILINOGEN 0.2 12/15/2014 1734   NITRITE NEGATIVE 01/07/2015 1352   NITRITE Negative 12/15/2014 1734  LEUKOCYTESUR NEGATIVE 01/07/2015 1352    Results for orders placed or performed during the hospital encounter of 01/07/15  Urine culture     Status: None (Preliminary result)   Collection Time: 01/07/15  2:33 PM  Result Value Ref Range Status   Specimen Description URINE, CLEAN CATCH  Final   Special Requests NONE  Final   Culture 50,000 COLONIES/mL ESCHERICHIA COLI  Final   Report Status PENDING  Incomplete  MRSA PCR Screening     Status: None   Collection Time: 01/07/15  5:31 PM  Result Value Ref Range Status   MRSA by PCR NEGATIVE NEGATIVE Final    Comment:        The GeneXpert MRSA Assay (FDA approved for NASAL specimens only), is one component of a comprehensive MRSA  colonization surveillance program. It is not intended to diagnose MRSA infection nor to guide or monitor treatment for MRSA infections.   Culture, blood (routine x 2)     Status: None (Preliminary result)   Collection Time: 01/07/15  8:10 PM  Result Value Ref Range Status   Specimen Description BLOOD LEFT ANTECUBITAL  Final   Special Requests BOTTLES DRAWN AEROBIC AND ANAEROBIC 5CC EACH  Final   Culture PENDING  Incomplete   Report Status PENDING  Incomplete     Imaging/Diagnostic Tests: Ct Head Wo Contrast  01/07/2015   CLINICAL DATA:  Altered mental status. Hydrocephalus with VP shunt catheter. Intraventricular mass.  EXAM: CT HEAD WITHOUT CONTRAST  TECHNIQUE: Contiguous axial images were obtained from the base of the skull through the vertex without intravenous contrast.  COMPARISON:  CT on 12/28/2014 and MRI on 12/17/2014  FINDINGS: Right frontal ventriculostomy remains in place with tip in the left frontal horn. Hydrocephalus is stable since previous study.  Small calcified mass in the inferior aspect of the fourth ventricle is stable.  Mild cerebral atrophy and moderate to severe chronic small vessel disease is stable in appearance. Old bilateral thalamic lacunar infarcts are unchanged.  There is no evidence of intracranial hemorrhage, brain edema, or other signs of acute infarction. There is no evidence of mass effect. No abnormal extraaxial fluid collections are identified. No skull abnormality identified.  IMPRESSION: Stable small calcified mass in the inferior aspect of the fourth ventricle. Stable hydrocephalus with ventriculostomy.  No new intracranial abnormality identified.  Stable cerebral atrophy, chronic small vessel disease, and old bilateral thalamic lacunar infarcts.   Electronically Signed   By: Earle Gell M.D.   On: 01/07/2015 18:57     Carlyle Dolly, MD 01/08/2015, 7:35 AM PGY-1, [place] Intern pager: [phone], text pages welcome

## 2015-01-09 ENCOUNTER — Telehealth: Payer: Self-pay | Admitting: Family Medicine

## 2015-01-09 ENCOUNTER — Telehealth: Payer: Self-pay | Admitting: Physician Assistant

## 2015-01-09 DIAGNOSIS — R918 Other nonspecific abnormal finding of lung field: Secondary | ICD-10-CM

## 2015-01-09 DIAGNOSIS — R4189 Other symptoms and signs involving cognitive functions and awareness: Secondary | ICD-10-CM | POA: Insufficient documentation

## 2015-01-09 LAB — RENAL FUNCTION PANEL
Albumin: 2.3 g/dL — ABNORMAL LOW (ref 3.5–5.0)
Anion gap: 5 (ref 5–15)
BUN: 17 mg/dL (ref 6–20)
CALCIUM: 8.6 mg/dL — AB (ref 8.9–10.3)
CO2: 22 mmol/L (ref 22–32)
CREATININE: 1.09 mg/dL — AB (ref 0.44–1.00)
Chloride: 115 mmol/L — ABNORMAL HIGH (ref 101–111)
GFR calc non Af Amer: 47 mL/min — ABNORMAL LOW (ref >60)
GFR, EST AFRICAN AMERICAN: 55 mL/min — AB (ref >60)
GLUCOSE: 101 mg/dL — AB (ref 65–99)
Phosphorus: 1.5 mg/dL — ABNORMAL LOW (ref 2.5–4.6)
Potassium: 3.8 mmol/L (ref 3.5–5.1)
Sodium: 142 mmol/L (ref 135–145)

## 2015-01-09 LAB — GLUCOSE, CAPILLARY
GLUCOSE-CAPILLARY: 145 mg/dL — AB (ref 65–99)
Glucose-Capillary: 103 mg/dL — ABNORMAL HIGH (ref 65–99)
Glucose-Capillary: 106 mg/dL — ABNORMAL HIGH (ref 65–99)
Glucose-Capillary: 111 mg/dL — ABNORMAL HIGH (ref 65–99)

## 2015-01-09 LAB — HEMOGLOBIN A1C
HEMOGLOBIN A1C: 6 % — AB (ref 4.8–5.6)
Mean Plasma Glucose: 126 mg/dL

## 2015-01-09 LAB — URINE CULTURE: Culture: 50000

## 2015-01-09 LAB — T3, FREE: T3 FREE: 2.7 pg/mL (ref 2.0–4.4)

## 2015-01-09 MED ORDER — CEPHALEXIN 500 MG PO CAPS
500.0000 mg | ORAL_CAPSULE | Freq: Four times a day (QID) | ORAL | Status: DC
  Administered 2015-01-09 – 2015-01-10 (×4): 500 mg via ORAL
  Filled 2015-01-09 (×7): qty 1

## 2015-01-09 NOTE — Clinical Social Work Note (Signed)
Clinical Social Work Assessment  Patient Details  Name: ZAKKIYYA BARNO MRN: [id number] Date of Birth: 07/11/1934  Date of referral:  01/07/15               Reason for consult:  Facility Placement                Permission sought to share information with:  Other (Patient only oriented to self. CSW talked with daughter Kynleigh Artz ) Permission granted to share information::  No  Name::     Darriel Sinquefield  Agency::     Relationship::  Daughter  Contact Information:  [phone] (mobile)  Housing/Transportation Living arrangements for the past 2 months:  [place], [place] (Patient was at [facility] for about a week before latest admission to hospital) Source of Information:  Adult Children Wellsite geologist) Patient Interpreter Needed:  None Criminal Activity/Legal Involvement Pertinent to Current Situation/Hospitalization:  No - Comment as needed Significant Relationships:  Adult Children Lives with:    Do you feel safe going back to the place where you live?  Yes Need for family participation in patient care:  Yes (Comment)  Care giving concerns:  None expressed by daughter   Facilities manager / plan:  CSW talked by phone with patient's daughter, Ling Flesch, and confirmed that patient will return to [facility] to continue her rehab. Per daughter, patient had been there about a week before coming to hospital.  Employment status:  Retired Insurance underwriter information:  Programmer, applications ([employer]) PT Recommendations:  Not assessed at this time Information / Referral to community resources:  Other (Comment Required) (None needed or requested at this time)  Patient/Family's Response to care: No concerns expressed by daughter   Patient/Family's Understanding of and Emotional Response to Diagnosis, Current Treatment, and Prognosis: Not discussed.  Emotional Assessment Appearance:  Other (Comment  Required (Not assessed) Attitude/Demeanor/Rapport:  Unable to Assess (Talked with daughter by phone.) Affect (typically observed):  Unable to Assess Orientation:  Oriented to Self Alcohol / Substance use:   (No information regarding smoking or alcohol consumption) Psych involvement (Current and /or in the community):     Discharge Needs  Concerns to be addressed:  Discharge Planning Concerns (Patient will d/c back to [facility] to continue her rehab.) Readmission within the last 30 days:  Yes Current discharge risk:  None Barriers to Discharge:  No Barriers Identified   Sable Feil, LCSW 01/09/2015, 6:44 PM

## 2015-01-09 NOTE — Care Management Important Message (Signed)
Important Message  Patient Details  Name: [name] MRN: [id number] Date of Birth: 29-Dec-1934   Medicare Important Message Given:  Yes-second notification given    Delorse Lek 01/09/2015, 12:09 PM

## 2015-01-09 NOTE — Telephone Encounter (Signed)
I spoke with [name] patient's daughter.  She remembers a MMSE being done on her mom at her 6/20 visit.  I cannot find record of this.  Dr Tamala Julian do you remember the results?

## 2015-01-09 NOTE — Progress Notes (Signed)
Family Medicine Teaching Service Daily Progress Note Intern Pager: [phone]  Patient name: [name] Medical record number: [id number] Date of birth: 02/23/1935 Age: 80 y.o. Gender: female  Primary Care Provider: Reginia Forts, MD Consultants: none Code Status: CNR (per her document)   Assessment and Plan: [name] is a 80 y.o. female presenting with AMS from SNF. PMH is significant for dementia, CAD, acquired obstructive hydrocephalus s/p VP shunt in 2015, paranoia, hyperthyroidism, hypercalcemia, hypertension, lung nodule and narcotic induced mental alteration.  AMS: Pt with hx of dementia and paranoia. According to daughter this is not her baseline. UTI is likely given her UA with many bacteria although there are no nitrites and LE. UCx grew E. Coli 50,000 colonies. Patient is not septic. She doesn't appear to have respiratory symptoms. Shunt malfunction is possible. Head CT on 7/3 with mild ventriculomegally and moderate small vessel ischemic changes. Less likely to be CVA given no neurologic deficit. Medication SE is possible. She is on methimazole since last hospitalization which could cause drowsiness. Hypothyroidism is also a possibility. She is also on risperidone which could cause sedation and drowsiness. We can not rule out meningitis or encephalitis although there is no recorded fever. Vit B12 level WNL on 7/2. CMP WNL except for bicarb of 19 and creatinine of 1.96. Ca 9.5 on admission. Patient has normal levels of attention on our interview, making acute delirium less likely, though patient may have waxing/waning course. Will obtain head CT to rule out bleed given history of recent falls. Suspect underlying psychiatric etiology given delusions about "unclean blood," though no psychiatric diagnosis found on EMR.  - CT head w/o contrast: no acute changes - consider LP if fever - Holding methimazole - Bcx pending - continue home resperidone - Patient's daughter, [name],  provides history of patient [phone]) - Neuro consult was placed; stated AMS was likely multifactorial - MMSE 12/30 on 7/15, do another tomorrow -Contact [facility] and get a hold on MMSE that was done 2 weeks ago according to daughter -Update daughter -Consult social work  Therapist, art. UA with many bacteria, few sqaums, negative nitrites, negarive leukocytes. May be potential source of patient's AMS. Has significant leukocytosis, though no fevers or CVA tenderness suggestive of pyelonephritis.  - Urine culture: 50,000 colonies of E. Coli -Received dose of Ceftriaxone in ED, given another dose on 7/14  -Monitor WBC, trending down    Elevated Troponin: 0.06>0.05>0.05 . EKG with slight ST depression. Patient has significant risk factors for ACS. HEART score of 5. Patient has CAD listed on problem list, but no further information was able to be obtained from patient.  - Troponin mildly elevated but remains stable. - EKG unchanged - Continue home aspirin and statin   Pulmonary Nodule. Incidentally found on CXR. Has history of RUL nodule found on CT in 2015 - Consider repeat CXR  AKI: creatinine 1.96. Recent b/l about 1.2. Also granular cast which makes ATN likely. ATN is also possible given her elevated creatinine (1.96) and granular cast.  - IVF - CMP -Cr 1.96>>1.75>>1.09  Hypertension: BP wnl. On losartan and metoprolol at home per EMR. - hold losartan given AKI - Continue home metoprolol  Hyperthyroidism: TSH on 0.019 on 6/20. Started on methimazole. - TSH (0.086) T4 (2.27)  - continue her home methimazole for now as above   FEN/GI:  F: IV NS 100 ml/hr E: replete as needed N: Heart healthy carb modified Prophylaxis: Heparin   Disposition: stable  Subjective:  Patient was seen earlier this morning.  Patient was in NAD, awake, and alert sitting comfortably in chair eating breakfast. She was very pleasant this morning which is very different from yesterday. Patient only  remembers certain details from yesterday  and was was oriented to person, time, but not place. MMSE was performed and she scored 12/30 which is indicative of moderate cognitive impairment. Patient will need help with ADLs and is not safe to go home alone at the present state. Social work will need to be consulted. Daughter did say she was willing to move in with her mother. Will need to discuss this with daughter.   Objective: Temp:  [98.9 F (37.2 C)-99.7 F (37.6 C)] 99.7 F (37.6 C) (07/15 0416) Pulse Rate:  [93-113] 101 (07/15 0416) Resp:  [16-18] 18 (07/15 0416) BP: (121-141)/(45-73) 121/53 mmHg (07/15 0416) SpO2:  [93 %-97 %] 95 % (07/15 0416) Physical Exam: Gen: No acute distress. Alert, cooperative with exam.  HEENT: Atraumatic, EOMI, PERRLA, Oropharynx clear. MMM CV: Irregular rate and rhythm. No murmurs, rubs, or gallops noted. 2+ radial and DP pulses bilaterally. Resp: CTAB. No wheezing, crackles, or rhonchi noted. Abd: +BS. Soft, non-distended, non-tender. No rebound or guarding. No CVA tenderness Ext: No edema. No gross deformities. Neuro: Alert, oriented to self, year and person, CN2-12 intact. Strength 5/5 in upper and lower extremities. Sensation to light touch grossly intact bilaterally. Rapid alternating movements and fine finger movements are intact. There is no dysmetria on finger-to-nose and heel-knee-shin. There are no abnormal or extraneous movements. Romberg is absent. Mild antalgic gait most likely due to weakness PSYCH: normal mood and affect. Poor insight  Laboratory:  Recent Labs Lab 01/07/15 1153 01/08/15 0300  WBC 25.8* 13.5*  HGB 12.1 8.8*  HCT 36.3 27.2*  PLT 359 276    Recent Labs Lab 01/07/15 1153 01/08/15 0300 01/09/15 0820  NA 137 139 142  K 4.2 3.9 3.8  CL 104 107 115*  CO2 19* 24 22  BUN 14 21* 17  CREATININE 1.96* 1.75* 1.09*  CALCIUM 9.5 8.2* 8.6*  PROT 6.4* 5.4*  --   BILITOT 0.6 0.3  --   ALKPHOS 118 95  --   ALT 16 12*  --    AST 21 16  --   GLUCOSE 325* 125* 101*   Urinalysis    Component Value Date/Time   COLORURINE AMBER* 01/07/2015 1352   APPEARANCEUR TURBID* 01/07/2015 1352   LABSPEC 1.019 01/07/2015 1352   PHURINE 5.0 01/07/2015 1352   GLUCOSEU 100* 01/07/2015 1352   HGBUR MODERATE* 01/07/2015 1352   BILIRUBINUR SMALL* 01/07/2015 1352   BILIRUBINUR Negative 12/15/2014 1734   KETONESUR 15* 01/07/2015 1352   PROTEINUR >300* 01/07/2015 1352   PROTEINUR 30 12/15/2014 1734   UROBILINOGEN 1.0 01/07/2015 1352   UROBILINOGEN 0.2 12/15/2014 1734   NITRITE NEGATIVE 01/07/2015 1352   NITRITE Negative 12/15/2014 1734   LEUKOCYTESUR NEGATIVE 01/07/2015 1352    Results for orders placed or performed during the hospital encounter of 01/07/15  Urine culture     Status: None (Preliminary result)   Collection Time: 01/07/15  2:33 PM  Result Value Ref Range Status   Specimen Description URINE, CLEAN CATCH  Final   Special Requests NONE  Final   Culture 50,000 COLONIES/mL ESCHERICHIA COLI  Final   Report Status PENDING  Incomplete  MRSA PCR Screening     Status: None   Collection Time: 01/07/15  5:31 PM  Result Value Ref Range Status   MRSA by PCR NEGATIVE NEGATIVE Final  Comment:        The GeneXpert MRSA Assay (FDA approved for NASAL specimens only), is one component of a comprehensive MRSA colonization surveillance program. It is not intended to diagnose MRSA infection nor to guide or monitor treatment for MRSA infections.   Culture, blood (routine x 2)     Status: None (Preliminary result)   Collection Time: 01/07/15  8:10 PM  Result Value Ref Range Status   Specimen Description BLOOD LEFT ANTECUBITAL  Final   Special Requests BOTTLES DRAWN AEROBIC AND ANAEROBIC 5CC EACH  Final   Culture NO GROWTH < 24 HOURS  Final   Report Status PENDING  Incomplete  Culture, blood (routine x 2)     Status: None (Preliminary result)   Collection Time: 01/07/15  8:20 PM  Result Value Ref Range Status    Specimen Description BLOOD LEFT HAND  Final   Special Requests BOTTLES DRAWN AEROBIC ONLY 5CC  Final   Culture NO GROWTH < 24 HOURS  Final   Report Status PENDING  Incomplete     Imaging/Diagnostic Tests: Ct Chest Wo Contrast  01/08/2015   CLINICAL DATA:  History of pneumonia. Clinical question of resolution.  Acute Kidney Injury, recent hypercalcemia with normal PTH level. Prior CT in 2015 showed SPN in RUL. CXR this admission shows possible new nodule left mid lung field. Patient has declined work-up of nodules with PET or biopsy.  EXAM: CT CHEST WITHOUT CONTRAST  TECHNIQUE: Multidetector CT imaging of the chest was performed following the standard protocol without IV contrast.  COMPARISON:  Chest radiograph, 01/07/2015.  Chest CT, 11/21/2013.  FINDINGS: Thoracic inlet:  No mass or adenopathy.  Normal thyroid.  Mediastinum and hila: Heart normal in size. There are dense coronary artery calcifications. Great vessels are normal in caliber. Calcified plaque is noted along the aortic arch and descending thoracic aorta. Mildly enlarged subcarinal lymph node measuring 11 mm in short axis. No other adenopathy. No mediastinal masses. No hilar masses or adenopathy.  Lungs and pleura: There is an area of linear/ discoid opacity in the inferior, lateral left upper lobe that accounts for the chest radiographic opacity seen on the current study. This is most likely atelectasis. There is no discrete lung nodule or mass in this location. However, there are small bilateral posterior upper lobe nodules. The largest lies in the posterior right upper lobe, centered on image 20, series 3, measuring 12 mm, stable from the prior CT. This has a tubular appearance. Other small adjacent nodules are noted, more numerous on the prior exam. Nodules in the posterior left upper lobe are also more numerous than on the prior exam. Largest nodule on the left measures 6.5 mm, image 18, series 3. There is dependent opacity in both lower  lobes most consistent with atelectasis. There is also minimal left pleural effusion. There is mild reticular opacity dependently in the upper lobes likely subsegmental atelectasis. There is no convincing pneumonia or pulmonary edema. No pneumothorax.  Limited upper abdomen: 1 cm low-density lesion in the anterior lateral segment of the left liver lobe consistent with a cyst, stable. Status post cholecystectomy. There is significant stranding in the visible perinephric space on the left. This was not present previously. The etiology of this is unclear.  Musculoskeletal: Mild compression fracture of T7 stable from the prior study. No new fractures. No osteoblastic or osteolytic lesions.  IMPRESSION: 1. Multiple small posterior upper lobe pulmonary nodules, increased in number from the prior exam. Largest nodule in the posterior right  upper lobe is stable in size from prior study. These are most likely inflammatory in origin. The dominant right upper lobe nodule has been stable for 1 year. Given that the number of nodules have crease, follow-up chest CT to reassess in 6 months is recommended. 2. No lung consolidation is seen to suggest pneumonia. Dependent lung opacity is noted mostly in the lower lobes consistent with atelectasis. There is a minimal left pleural effusion but no evidence of pulmonary edema. 3. Small nodular opacity noted in the left peripheral mid lung on the recent prior chest radiograph is due to a linear area of presumed atelectasis the left upper lobe. There is no nodule in this location. 4. Dense coronary artery calcifications. Mildly enlarged subcarinal lymph node without substantial change from the prior study and therefore presumed reactive. 5. There is inflammatory type stranding in the visible superior aspect of the left perinephric space. The etiology of this is unclear, and it was not present on the prior chest CT. It may reflect hemorrhage. Consider followup abdomen and pelvis CT for  further assessment.   Electronically Signed   By: Lajean Manes M.D.   On: 01/08/2015 18:00     Carlyle Dolly, MD 01/09/2015, 7:20 AM PGY-1, [place] Intern pager: [phone], text pages welcome

## 2015-01-09 NOTE — Evaluation (Signed)
Physical Therapy Evaluation Patient Details Name: [name] MRN: [id number] DOB: [birth year] Today's Date: 01/09/2015   History of Present Illness  Patient is a 80 yo female admitted 01/07/15 from [facility] with AMS, UTI, AKI, hyperthyroid.  PMH:  Dementia, CAD, obstructive hydrocephalus, VP shunt, HTN  Clinical Impression  Patient presents with problems listed below.  Patient will benefit from acute PT to maximize mobility prior to discharge.  Patient with decreased cognition and safety awareness.  Patient would need 24 hour assist at home for safety and basic tasks.  Per patient, her daughter works.  Patient would be at home alone during day.  Recommend SNF at discharge for 24 hour assist and continued therapy.    Follow Up Recommendations SNF;Supervision/Assistance - 24 hour    Equipment Recommendations  None recommended by PT    Recommendations for Other Services       Precautions / Restrictions Precautions Precautions: Fall Precaution Comments: Hx of falls at home Restrictions Weight Bearing Restrictions: No      Mobility  Bed Mobility Overal bed mobility: Needs Assistance Bed Mobility: Supine to Sit;Sit to Supine     Supine to sit: Min guard Sit to supine: Min guard   General bed mobility comments: Assist for safety.  Transfers Overall transfer level: Needs assistance Equipment used: Rolling walker (2 wheeled) Transfers: Sit to/from Stand Sit to Stand: Min guard         General transfer comment: Repeated verbal cues for safe hand placement.  Assist for safety.  Ambulation/Gait Ambulation/Gait assistance: Min assist Ambulation Distance (Feet): 62 Feet Assistive device: Rolling walker (2 wheeled) Gait Pattern/deviations: Step-through pattern;Decreased stride length;Trunk flexed Gait velocity: decreased Gait velocity interpretation: Below normal speed for age/gender General Gait Details: Assist to maneuver RW around obstacles.  Patient with unsteady  gait, assist for balance/safety.  Stairs            Wheelchair Mobility    Modified Rankin (Stroke Patients Only)       Balance           Standing balance support: Bilateral upper extremity supported;During functional activity Standing balance-Leahy Scale: Poor                               Pertinent Vitals/Pain Pain Assessment: No/denies pain    Home Living Family/patient expects to be discharged to:: Skilled nursing facility Living Arrangements: Alone Available Help at Discharge: Family;Available PRN/intermittently (Daughter checks on patient.  Daughter works) Type of Home: [employer] Access: Stairs to enter Entrance Stairs-Rails: Psychiatric nurse of Steps: [place]: Able to live on main level with bedroom/bathroom Home Equipment: Environmental consultant - 2 wheels;Cane - single point;Bedside commode Additional Comments: Information partially from patient.  Need to verify.    Prior Function Level of Independence: Independent with assistive device(s) (Prior to going to [facility])         Comments: Daughter assists with housekeeping and errands per patient.     Hand Dominance   Dominant Hand: Right    Extremity/Trunk Assessment   Upper Extremity Assessment: Overall WFL for tasks assessed           Lower Extremity Assessment: Generalized weakness         Communication   Communication: No difficulties  Cognition Arousal/Alertness: Awake/alert Behavior During Therapy: Anxious Overall Cognitive Status: Impaired/Different from baseline Area of Impairment: Orientation;Memory;Following commands;Safety/judgement;Awareness;Problem solving Orientation Level: Disoriented to;Situation   Memory: Decreased short-term memory Following  Commands: Follows one step commands inconsistently;Follows one step commands with increased time Safety/Judgement: Decreased awareness of safety   Problem Solving: Slow processing;Decreased  initiation;Difficulty sequencing;Requires verbal cues General Comments: Patient repeating questions to PT regarding "what she has to do" for returning to SNF.  Then patient states it will be hard for her daughter to take her to SNF in the mornings because she works.  When asked why she is in hospital, she states "We didn't have to come.  The rules changed and so we came to answer questions"  She repeated the same thing about going to [facility] initially.  Patient wanted peanut butter crackers.  Patient unable to figure out how to put the peanut butter on the crackers, even after demonstration by PT.    General Comments      Exercises        Assessment/Plan    PT Assessment Patient needs continued PT services  PT Diagnosis Difficulty walking;Generalized weakness;Altered mental status   PT Problem List Decreased strength;Decreased balance;Decreased mobility;Decreased cognition;Decreased knowledge of use of DME;Decreased safety awareness  PT Treatment Interventions DME instruction;Gait training;Functional mobility training;Therapeutic activities;Cognitive remediation;Patient/family education   PT Goals (Current goals can be found in the Care Plan section) Acute Rehab PT Goals Patient Stated Goal: None stated PT Goal Formulation: With patient Time For Goal Achievement: 01/16/15 Potential to Achieve Goals: Fair    Frequency Min 2X/week   Barriers to discharge Decreased caregiver support Patient lives alone.  Daughter works during day.  Does not have 24 hour assist.    Co-evaluation               End of Session Equipment Utilized During Treatment: Gait belt Activity Tolerance: Patient tolerated treatment well Patient left: in bed;with call bell/phone within reach;with bed alarm set Nurse Communication: Mobility status (Decreased cognition/safety)         Time: 1245-8099 PT Time Calculation (min) (ACUTE ONLY): 28 min   Charges:   PT Evaluation $Initial PT Evaluation Tier I:  1 Procedure PT Treatments $Gait Training: 8-22 mins   PT G CodesDespina Pole Jan 16, 2015, 6:58 PM Carita Pian. Sanjuana Kava, [place] Pager [phone]

## 2015-01-09 NOTE — Telephone Encounter (Signed)
Please see Sarah's previous message about this patient. Patient's daughter called to update Dr.Smith on patient's current status. She is concerned about the MMSE results. States that her mom had it done here however the physician at the hospital and [facility] did not see any results from our office. Please advise.  [phone] [name] daughter)

## 2015-01-09 NOTE — Telephone Encounter (Signed)
MMSE on 04/02/14 - 27/30. No MMSE more recent. [name] is calling to discuss with pt's nurse. Please let daughter know we found MMSE results.

## 2015-01-09 NOTE — Telephone Encounter (Signed)
Elena from [facility] called regarding the patient's Altered mental status - admittance  12/20 MMSE currently  I reviewed the chart and could not locate a MMSE results from her visit here.

## 2015-01-10 LAB — GLUCOSE, CAPILLARY
GLUCOSE-CAPILLARY: 136 mg/dL — AB (ref 65–99)
Glucose-Capillary: 95 mg/dL (ref 65–99)
Glucose-Capillary: 131 mg/dL — ABNORMAL HIGH (ref 65–99)

## 2015-01-10 MED ORDER — CEPHALEXIN 500 MG PO CAPS: 500.0000 mg | ORAL_CAPSULE | Freq: Four times a day (QID) | ORAL | Status: AC

## 2015-01-10 NOTE — Discharge Instructions (Signed)
You were admitted to the hospital with confusion. This was likely due to the urinary tract infection. You will be going back to your nursing facility. Please continue to take the full course of your antibiotics, even if you are feeling better. It is very important that you follow up with your primary care doctor.

## 2015-01-10 NOTE — Progress Notes (Signed)
Pt discharged to snf per MD. Report called to [facility]. [facility]emerg[facility] EMS arrived for transport. NSL out with catheter intact. Bartholomew Crews, RN

## 2015-01-10 NOTE — Progress Notes (Signed)
Family Medicine Teaching Service Daily Progress Note Intern Pager: [phone]  Patient name: [name] Medical record number: [id number] Date of birth: [birth year] Age: 80 y.o. Gender: female  Primary Care Provider: Reginia Forts, MD Consultants: none Code Status: Full (per daughter)  Assessment and Plan: PRESTYN MAHN is a 80 y.o. female presenting with AMS from SNF. PMH is significant for dementia, CAD, acquired obstructive hydrocephalus s/p VP shunt in 2015, paranoia, hyperthyroidism, hypercalcemia, hypertension, lung nodule and narcotic induced mental alteration.  AMS, improved: Likely secondary to delirium with multifactorial etiologies in setting of UTI, hyperthyroidism, and AKI. Head CT stable. Likely underlying cognitive impairment (MMSE 12/30 while here) and possible psychiatric illness (has history of paranoia and AVH). Blood culture negative. Patient's daughter, [name], provides history of patient [phone]) - continue home resperidone - Neurology consulted, appreciate assistance. Feel like AMS likely mutifactorial as stated above.   E coli UTI.  Urine culture: 50,000 colonies of E. Coli May be potential source of patient's AMS. Had significant leukocytosis on admission, though no fevers or CVA tenderness suggestive of pyelonephritis.   - CTX (7/13-7/14) - Keflex (7/15- )   CAD. Patient's troponin elevated during this admission, but stable. EKG unchanged.  - Continue home aspirin and statin   Pulmonary Nodule. Incidentally found on CXR. Has history of RUL nodule found on CT in 2015 - Will need outpatient PET scan.   AKI, resolved: creatinine 1.96. Recent b/l about 1.2. Likely pre-renal in setting of poor PO intake.  - Avoid nephrotoxic medications.   Hypertension: BP wnl. On losartan and metoprolol at home per EMR. - hold losartan given AKI - Continue home metoprolol  Hyperthyroidism: TSH on 0.019 on 6/20. Started on methimazole. - TSH (0.086) T4 (2.27)  -  continue her home methimazole for now as above  FEN/GI:  F: SLIV E: replete as needed N: Heart healthy carb modified  Prophylaxis: Heparin   Disposition: Admitted to med-surg floor. Medically clear for discharge back to SNF when bed available.   Subjective:  Doing well this morning. Patient's daughter in the room is comfortable with patient going back to SNF. No acute complaints this morning. Looking forward to possible discharge later today.   Objective: Temp:  [98 F (36.7 C)-98.8 F (37.1 C)] 98.8 F (37.1 C) (07/16 0834) Pulse Rate:  [55-106] 62 (07/16 1012) Resp:  [17-18] 18 (07/16 0834) BP: (118-152)/(52-68) 152/52 mmHg (07/16 0834) SpO2:  [98 %] 98 % (07/16 0834) Weight:  [128 lb (58.06 kg)] 128 lb (58.06 kg) (07/15 2100) Physical Exam: Gen: No acute distress. Alert, cooperative with exam.  CV: RRR with several ectopic beats noted. No murmurs Resp: NWOB, CTAB Abd: +BS. Soft, non-distended, non-tender. No rebound or guarding. No CVA tenderness Ext: No edema. No gross deformities. Neuro: alert and conversational. No focal neurologic deficits.  PSYCH: normal mood and affect. Poor insight  Laboratory: None New.    Vivi Barrack, MD 01/10/2015, 11:57 AM PGY-2, [place] Intern pager: [phone], text pages welcome

## 2015-01-10 NOTE — Discharge Summary (Signed)
[facility] Discharge Summary  Patient name: [name] Medical record number: [id number] Date of birth: Jan 03, 1935 Age: 80 y.o. Gender: female Date of Admission: 01/07/2015  Date of Discharge: 01/09/2102 Admitting Physician: Dickie La, MD  Primary Care Provider: Reginia Forts, MD Consultants: Neurology  Indication for Hospitalization: AMS  Discharge Diagnoses/Problem List:  AMS, UTI, Pulmonary nodule, CAD, AKI, Hypertension, Hyperthyroidism  Disposition: SNF  Discharge Condition: Improved  Discharge Exam: Blood pressure 152/52, pulse 62, temperature 98.8 F (37.1 C), temperature source Oral, resp. rate 18, height 5\' 4"  (1.626 m), weight 128 lb (58.06 kg), SpO2 98 %. Gen: No acute distress. Alert, cooperative with exam.  CV: RRR with several ectopic beats noted. No murmurs Resp: NWOB, CTAB Abd: +BS. Soft, non-distended, non-tender. No rebound or guarding. No CVA tenderness Ext: No edema. No gross deformities. Neuro: alert and conversational. No focal neurologic deficits.  PSYCH: normal mood and affect. Poor insight  Brief Hospital Course:  [name] is a 80 year old female who presented from her SNF with AMS. Her PMH is significant for dementia, CAD, acquired obstructive hydrocephalus s/p VP shunt in 2015, paranoia, hyperthyroidism, hypercalcemia, hypertension, lung nodule and narcotic induced mental alteration. Her hospital course by problem is outlined below:  Delirium secondary to UTI / AKI. Patient was found to be fully alert and attentive on admission, however was noted to make bizarre statements about "unclean blood." Initial work up included a head CT which showed stable hydrocephalus in the setting of a VP shunt. TSH was found to be low at 0.086 with an elevated Free T4 of 2.27. Patient was also found to have many bacteria on her UA with negative leukocytes and negative nitrites. Differential at the time of admission included  delirium in the setting o UTI, hyperthyroidism, and AKI (see problem below). Patient was also found to have underlying cognitive impairment (MMSE 12/30 while here). There was also some evidence of underlying psychiatric illness given her history of paranoia and AVH. Neurology was consulted and also felt like her AMS was multifactorial in the setting of the above mentioned problems. Patient's mental status gradually improved back to baseline with the treatment of her UTI and AKI and the patient was discharged back to SNF in stable condition.  UTI. Patient's UA on admission was significant for many bacteria. Urine culture grew 50k colonies of ecoli. Blood cultures showed no growth. Patient received 2 days of IV Rocephin and was transitioned to PO keflex piror to discharge. Patient will be discharged to SNF to complete her 7 day course of antibiotics.   Pulmonary Nodule. 1cm lef mid lung opacity was incidentally found on initally CXR. CT chest was performed and revealed multiple small posterior upper lobe nodules, increased in number from previous CT. There was no further investigation here, however daughter expressed desire for follow up imaging.   AKI. Patient's Cr was 1.96 on admission, up from a baseline of 1.2. This was likely prerenal in the setting of decreased PO intake. Patient was given IVF and her Cr returned to baseline levels at the time of discharge.   CAD.  Patient was noted to have slight troponin elevation here, which remained stable throughout her course. Patient was continued on her home aspirin and statin.   The patient's other chronic conditions were stable during this admission.   Issues for Follow Up:  1. Patient will need to be seen by her PCP Dr Reginia Forts early next week for referral for PET scan to  follow up her pulmonology nodules.  2. Patient discharged to finish a total 7 day course of antibiotics for her UTI (End date 7/20) 3. Follow up TSH and Free T4 in four weeks -  patient was recently started on methimazole for hyperthyroidism. 4. Consider monitoring calcium level to detect possible return of hypercalcemia 5. Consider neurology referral as outpatient for re-assessment of cognitive impairment.   Significant Procedures: None  Significant Labs and Imaging:   Recent Labs Lab 01/07/15 1153 01/08/15 0300  WBC 25.8* 13.5*  HGB 12.1 8.8*  HCT 36.3 27.2*  PLT 359 276    Recent Labs Lab 01/07/15 1153 01/08/15 0300 01/09/15 0820  NA 137 139 142  K 4.2 3.9 3.8  CL 104 107 115*  CO2 19* 24 22  GLUCOSE 325* 125* 101*  BUN 14 21* 17  CREATININE 1.96* 1.75* 1.09*  CALCIUM 9.5 8.2* 8.6*  PHOS  --   --  1.5*  ALKPHOS 118 95  --   AST 21 16  --   ALT 16 12*  --   ALBUMIN 3.0* 2.3* 2.3*   Urinalysis    Component Value Date/Time   COLORURINE AMBER* 01/07/2015 1352   APPEARANCEUR TURBID* 01/07/2015 1352   LABSPEC 1.019 01/07/2015 1352   PHURINE 5.0 01/07/2015 1352   GLUCOSEU 100* 01/07/2015 1352   HGBUR MODERATE* 01/07/2015 1352   BILIRUBINUR SMALL* 01/07/2015 1352   BILIRUBINUR Negative 12/15/2014 1734   KETONESUR 15* 01/07/2015 1352   PROTEINUR >300* 01/07/2015 1352   PROTEINUR 30 12/15/2014 1734   UROBILINOGEN 1.0 01/07/2015 1352   UROBILINOGEN 0.2 12/15/2014 1734   NITRITE NEGATIVE 01/07/2015 1352   NITRITE Negative 12/15/2014 1734   LEUKOCYTESUR NEGATIVE 01/07/2015 1352    Urine Culture: 50K ecoli     Recent Labs Lab 01/07/15 1158 01/07/15 1549 01/07/15 2020 01/08/15 0300 01/08/15 0906  TROPONINI 0.04* 0.05* 0.06* 0.05* 0.05*   TSH 0.086 Free T4 2.27 Free T3 2.7  EKG: NSR, no acute ischemic changes.  Dg Chest 2 View  01/07/2015   CLINICAL DATA:  Altered awareness.  Lethargy.  EXAM: CHEST  2 VIEW  COMPARISON:  12/28/2014  FINDINGS: Shunt tubing projects over the right chest without visible discontinuity.  Atherosclerotic aortic arch with mild aortic tortuosity. Heart size currently normal.  Chronic deformity left  proximal humerus from old healed fracture.  Dextroconvex upper lumbar scoliosis.  Suspected emphysema.  Mild mid thoracic wedging, similar to prior.  1.0 by 0.9 cm density peripherally in the left mid lung, admittedly projecting over rib and scapula.  Linear subsegmental atelectasis or scarring along the left hemidiaphragm.  IMPRESSION: 1. 1 cm peripheral left mid lung opacity may be a new pulmonary nodule. The patient also had a previous pulmonary nodule in the right upper lobe on a CT scan from may 2015 which warrants follow up at this time in order to assess for any change. Accordingly, I do recommend chest CT both to evaluate this new finding and to reassess the old finding. 2. Atherosclerotic aortic arch. 3. Subsegmental atelectasis or scar peripherally at the left lung base. 4. Chronic deformity of the left proximal humerus from old healed fracture.   Electronically Signed   By: Van Clines M.D.   On: 01/07/2015 13:29   Ct Head Wo Contrast  01/07/2015   CLINICAL DATA:  Altered mental status. Hydrocephalus with VP shunt catheter. Intraventricular mass.  EXAM: CT HEAD WITHOUT CONTRAST  TECHNIQUE: Contiguous axial images were obtained from the base of the skull through  the vertex without intravenous contrast.  COMPARISON:  CT on 12/28/2014 and MRI on 12/17/2014  FINDINGS: Right frontal ventriculostomy remains in place with tip in the left frontal horn. Hydrocephalus is stable since previous study.  Small calcified mass in the inferior aspect of the fourth ventricle is stable.  Mild cerebral atrophy and moderate to severe chronic small vessel disease is stable in appearance. Old bilateral thalamic lacunar infarcts are unchanged.  There is no evidence of intracranial hemorrhage, brain edema, or other signs of acute infarction. There is no evidence of mass effect. No abnormal extraaxial fluid collections are identified. No skull abnormality identified.  IMPRESSION: Stable small calcified mass in the  inferior aspect of the fourth ventricle. Stable hydrocephalus with ventriculostomy.  No new intracranial abnormality identified.  Stable cerebral atrophy, chronic small vessel disease, and old bilateral thalamic lacunar infarcts.   Electronically Signed   By: Earle Gell M.D.   On: 01/07/2015 18:57   Ct Chest Wo Contrast  01/08/2015   CLINICAL DATA:  History of pneumonia. Clinical question of resolution.  Acute Kidney Injury, recent hypercalcemia with normal PTH level. Prior CT in 2015 showed SPN in RUL. CXR this admission shows possible new nodule left mid lung field. Patient has declined work-up of nodules with PET or biopsy.  EXAM: CT CHEST WITHOUT CONTRAST  TECHNIQUE: Multidetector CT imaging of the chest was performed following the standard protocol without IV contrast.  COMPARISON:  Chest radiograph, 01/07/2015.  Chest CT, 11/21/2013.  FINDINGS: Thoracic inlet:  No mass or adenopathy.  Normal thyroid.  Mediastinum and hila: Heart normal in size. There are dense coronary artery calcifications. Great vessels are normal in caliber. Calcified plaque is noted along the aortic arch and descending thoracic aorta. Mildly enlarged subcarinal lymph node measuring 11 mm in short axis. No other adenopathy. No mediastinal masses. No hilar masses or adenopathy.  Lungs and pleura: There is an area of linear/ discoid opacity in the inferior, lateral left upper lobe that accounts for the chest radiographic opacity seen on the current study. This is most likely atelectasis. There is no discrete lung nodule or mass in this location. However, there are small bilateral posterior upper lobe nodules. The largest lies in the posterior right upper lobe, centered on image 20, series 3, measuring 12 mm, stable from the prior CT. This has a tubular appearance. Other small adjacent nodules are noted, more numerous on the prior exam. Nodules in the posterior left upper lobe are also more numerous than on the prior exam. Largest nodule  on the left measures 6.5 mm, image 18, series 3. There is dependent opacity in both lower lobes most consistent with atelectasis. There is also minimal left pleural effusion. There is mild reticular opacity dependently in the upper lobes likely subsegmental atelectasis. There is no convincing pneumonia or pulmonary edema. No pneumothorax.  Limited upper abdomen: 1 cm low-density lesion in the anterior lateral segment of the left liver lobe consistent with a cyst, stable. Status post cholecystectomy. There is significant stranding in the visible perinephric space on the left. This was not present previously. The etiology of this is unclear.  Musculoskeletal: Mild compression fracture of T7 stable from the prior study. No new fractures. No osteoblastic or osteolytic lesions.  IMPRESSION: 1. Multiple small posterior upper lobe pulmonary nodules, increased in number from the prior exam. Largest nodule in the posterior right upper lobe is stable in size from prior study. These are most likely inflammatory in origin. The dominant right upper lobe  nodule has been stable for 1 year. Given that the number of nodules have crease, follow-up chest CT to reassess in 6 months is recommended. 2. No lung consolidation is seen to suggest pneumonia. Dependent lung opacity is noted mostly in the lower lobes consistent with atelectasis. There is a minimal left pleural effusion but no evidence of pulmonary edema. 3. Small nodular opacity noted in the left peripheral mid lung on the recent prior chest radiograph is due to a linear area of presumed atelectasis the left upper lobe. There is no nodule in this location. 4. Dense coronary artery calcifications. Mildly enlarged subcarinal lymph node without substantial change from the prior study and therefore presumed reactive. 5. There is inflammatory type stranding in the visible superior aspect of the left perinephric space. The etiology of this is unclear, and it was not present on the  prior chest CT. It may reflect hemorrhage. Consider followup abdomen and pelvis CT for further assessment.   Electronically Signed   By: Lajean Manes M.D.   On: 01/08/2015 18:00    Results/Tests Pending at Time of Discharge: None  Discharge Medications:    Medication List    TAKE these medications        aspirin 81 MG tablet  Take 81 mg by mouth daily.     cephALEXin 500 MG capsule  Commonly known as:  KEFLEX  Take 1 capsule (500 mg total) by mouth every 6 (six) hours.     loratadine 10 MG tablet  Commonly known as:  CLARITIN  Take 1 tablet (10 mg total) by mouth daily as needed for allergies.     methimazole 5 MG tablet  Commonly known as:  TAPAZOLE  Take 3 tablets (15 mg total) by mouth 3 (three) times daily.     metoprolol succinate 50 MG 24 hr tablet  Commonly known as:  TOPROL-XL  Take 1 tablet (50 mg total) by mouth daily.     multivitamin with minerals Tabs tablet  Take 1 tablet by mouth daily.     pravastatin 40 MG tablet  Commonly known as:  PRAVACHOL  Take 1 tablet (40 mg total) by mouth daily.     risperiDONE 0.5 MG tablet  Commonly known as:  RISPERDAL  Take 1 tablet (0.5 mg total) by mouth at bedtime.     vitamin B-12 1000 MCG tablet  Commonly known as:  CYANOCOBALAMIN  Take 1 tablet (1,000 mcg total) by mouth daily.        Discharge Instructions: Please refer to Patient Instructions section of EMR for full details.  Patient was counseled important signs and symptoms that should prompt return to medical care, changes in medications, dietary instructions, activity restrictions, and follow up appointments.   Follow-Up Appointments: Discharged to SNF Follow-up Information    Schedule an appointment as soon as possible for a visit with Reginia Forts, MD.   Specialty:  Family Medicine   Contact information:   [place] [address] [phone]       Vivi Barrack, MD 01/10/2015, 1:57 PM PGY-2, [place]

## 2015-01-10 NOTE — Progress Notes (Signed)
CSW (Clinical Social Worker) prepared pt dc packet and placed with shadow chart. CSW arranged non-emergent ambulance transport. Pt, pt family, pt nurse, and facility informed. CSW signing off.  [place]okes Craine, LCSWA Weekend CSW [phone]   

## 2015-01-10 NOTE — Progress Notes (Signed)
CSW Armed forces technical officer) spoke with facility and confirmed pt can return today. CSW paged MD to notify and request dc summary be completed as soon as possible.   [place], LCSWA Weekend CSW [phone]

## 2015-01-12 ENCOUNTER — Ambulatory Visit: Payer: Medicare Other | Admitting: Family Medicine

## 2015-01-12 ENCOUNTER — Telehealth: Payer: Self-pay

## 2015-01-12 DIAGNOSIS — R918 Other nonspecific abnormal finding of lung field: Secondary | ICD-10-CM

## 2015-01-12 LAB — CULTURE, BLOOD (ROUTINE X 2)
CULTURE: NO GROWTH
CULTURE: NO GROWTH

## 2015-01-12 NOTE — Telephone Encounter (Signed)
[name] called back to say she is having trouble getting a referral for her mom to have a pet scan done and she really would like to explain some things to Dr Tamala Julian, she doesn't want her to think She doesn't understand because she do. Please call [phone]

## 2015-01-12 NOTE — Telephone Encounter (Signed)
Pt daughter called back and asked that dr Tamala Julian disregard the previous message because rehab has started the process of the referral

## 2015-01-12 NOTE — Telephone Encounter (Addendum)
[name] would like to speak with Dr.Smith regarding her mom. They have cancelled her appt for today because her mom was able to get into the facility they had spoken about but wanted to give her An update on the hospital situation and also need her mom to have a PET Scan but they isn't able to authorize her to have it done, need to know the next step Please call [phone]

## 2015-01-13 NOTE — Telephone Encounter (Signed)
[name] called to see if Dr. Tamala Julian can order the PET scan for pt because their primary care physician is not there. She has a lung nodule that was found on Xray. Dr. Tamala Julian can we order this for her? Please call [phone]

## 2015-01-15 NOTE — ED Provider Notes (Signed)
CSN: [id number]     Arrival date & time 01/07/15  1118 History   First MD Initiated Contact with Patient 01/07/15 1132     Chief Complaint  Patient presents with  . Fatigue     (Consider location/radiation/quality/duration/timing/severity/associated sxs/prior Treatment) HPI   80 y.o. female presenting with AMS from SNF. PMH is significant for dementia, CAD, acquired obstructive hydrocephalus s/p VP shunt in 2015, paranoia, hyperthyroidism, hypercalcemia, hypertension, lung nodule and narcotic induced mental alteration. Additional history from daughter. Pt with increased weakness/drowsiness since this morning. Seemed to be sleeping in chair. Difficult to arouse to point that staff was concerned. Eventually able to wake her up. Pt with no specific complaints. She is not sure why she is so tired. No reported trauma. No fever. No acute pain complaints. No n/v.   Past Medical History  Diagnosis Date  . Hyperlipidemia   . Essential hypertension, benign   . CAD (coronary artery disease)   . Osteoporosis   . Hypercalcemia   . Vitamin D deficiency   . Ovarian tumor   . Breast tumor   . MI (myocardial infarction) July 1995    Treated with LAD PCI- Dr Adora Fridge  . Cataract   . Complication of anesthesia   . PONV (postoperative nausea and vomiting)   . Lung tumor     recently found approx 25 days Simone Curia, MD   Past Surgical History  Procedure Laterality Date  . Angioplasty    . Hip pinning    . Breast lumpectomy    . Abdominal hysterectomy    . Cholecystectomy    . Fracture surgery  Nov 2003    L distal radius fx- Dr. Frederik Pear  . Eye surgery      Cataract removal  . Cardiac catheterization      1990's  . Ventriculoperitoneal shunt Right 12/20/2013    Procedure: SHUNT INSERTION VENTRICULAR-PERITONEAL;  Surgeon: Winfield Cunas, MD;  Location: [facility];  Service: Neurosurgery;  Laterality: Right;  VP shunt insertion  . Doppler echocardiography  2010   Family History   Problem Relation Age of Onset  . Stroke Mother   . Heart disease Father   . Cancer Sister    History  Substance Use Topics  . Smoking status: Current Some Day Smoker -- 1.00 packs/day for 60 years    Types: Cigarettes  . Smokeless tobacco: Not on file  . Alcohol Use: No   OB History    No data available     Review of Systems  All systems reviewed and negative, other than as noted in HPI.   Allergies  Sulfa antibiotics; Codeine; Hydrocodone; and Other  Home Medications   Prior to Admission medications   Medication Sig Start Date End Date Taking? Authorizing Provider  aspirin 81 MG tablet Take 81 mg by mouth daily.   Yes Historical Provider, MD  loratadine (CLARITIN) 10 MG tablet Take 1 tablet (10 mg total) by mouth daily as needed for allergies. 12/30/14  Yes Debbe Odea, MD  methimazole (TAPAZOLE) 5 MG tablet Take 3 tablets (15 mg total) by mouth 3 (three) times daily. 12/30/14  Yes Debbe Odea, MD  metoprolol succinate (TOPROL-XL) 50 MG 24 hr tablet Take 1 tablet (50 mg total) by mouth daily. 12/22/14  Yes Lorretta Harp, MD  Multiple Vitamin (MULTIVITAMIN WITH MINERALS) TABS tablet Take 1 tablet by mouth daily. 11/25/13  Yes Robbie Lis, MD  pravastatin (PRAVACHOL) 40 MG tablet Take 1 tablet (40 mg  total) by mouth daily. 10/20/14  Yes Lorretta Harp, MD  risperiDONE (RISPERDAL) 0.5 MG tablet Take 1 tablet (0.5 mg total) by mouth at bedtime. 12/30/14  Yes Debbe Odea, MD  vitamin B-12 (CYANOCOBALAMIN) 1000 MCG tablet Take 1 tablet (1,000 mcg total) by mouth daily. 12/30/14  Yes Saima Rizwan, MD   BP 152/52 mmHg  Pulse 62  Temp(Src) 98.8 F (37.1 C) (Oral)  Resp 18  Ht 5\' 4"  (1.626 m)  Wt 128 lb (58.06 kg)  BMI 21.96 kg/m2  SpO2 98% Physical Exam  Constitutional: She appears well-developed and well-nourished. No distress.  HENT:  Head: Normocephalic and atraumatic.  Eyes: Conjunctivae are normal. Right eye exhibits no discharge. Left eye exhibits no discharge.  Neck:  Neck supple.  Cardiovascular: Normal rate, regular rhythm and normal heart sounds.  Exam reveals no gallop and no friction rub.   No murmur heard. Pulmonary/Chest: Effort normal and breath sounds normal. No respiratory distress.  Abdominal: Soft. She exhibits no distension. There is no tenderness.  Musculoskeletal: She exhibits no edema or tenderness.  Neurological: No cranial nerve deficit. She exhibits normal muscle tone. Coordination normal.  Drowsy. Opens eyes to voice. Follows commands. No focal motor deficit.   Skin: Skin is warm and dry.  Psychiatric: She has a normal mood and affect. Her behavior is normal. Thought content normal.  Nursing note and vitals reviewed.   ED Course  Procedures (including critical care time) Labs Review Labs Reviewed  COMPREHENSIVE METABOLIC PANEL - Abnormal; Notable for the following:    CO2 19 (*)    Glucose, Bld 325 (*)    Creatinine, Ser 1.96 (*)    Total Protein 6.4 (*)    Albumin 3.0 (*)    GFR calc non Af Amer 23 (*)    GFR calc Af Amer 27 (*)    All other components within normal limits  CBC - Abnormal; Notable for the following:    WBC 25.8 (*)    All other components within normal limits  TROPONIN I - Abnormal; Notable for the following:    Troponin I 0.04 (*)    All other components within normal limits  URINALYSIS, ROUTINE W REFLEX MICROSCOPIC (NOT AT [facility]) - Abnormal; Notable for the following:    Color, Urine AMBER (*)    APPearance TURBID (*)    Glucose, UA 100 (*)    Hgb urine dipstick MODERATE (*)    Bilirubin Urine SMALL (*)    Ketones, ur 15 (*)    Protein, ur >300 (*)    All other components within normal limits  URINE MICROSCOPIC-ADD ON - Abnormal; Notable for the following:    Squamous Epithelial / LPF FEW (*)    Bacteria, UA MANY (*)    Casts GRANULAR CAST (*)    All other components within normal limits  TROPONIN I - Abnormal; Notable for the following:    Troponin I 0.05 (*)    All other components within  normal limits  TSH - Abnormal; Notable for the following:    TSH 0.086 (*)    All other components within normal limits  CBC - Abnormal; Notable for the following:    WBC 13.5 (*)    RBC 3.07 (*)    Hemoglobin 8.8 (*)    HCT 27.2 (*)    All other components within normal limits  COMPREHENSIVE METABOLIC PANEL - Abnormal; Notable for the following:    Glucose, Bld 125 (*)    BUN 21 (*)  Creatinine, Ser 1.75 (*)    Calcium 8.2 (*)    Total Protein 5.4 (*)    Albumin 2.3 (*)    ALT 12 (*)    GFR calc non Af Amer 27 (*)    GFR calc Af Amer 31 (*)    All other components within normal limits  TROPONIN I - Abnormal; Notable for the following:    Troponin I 0.06 (*)    All other components within normal limits  TROPONIN I - Abnormal; Notable for the following:    Troponin I 0.05 (*)    All other components within normal limits  TROPONIN I - Abnormal; Notable for the following:    Troponin I 0.05 (*)    All other components within normal limits  T4, FREE - Abnormal; Notable for the following:    Free T4 2.27 (*)    All other components within normal limits  HEMOGLOBIN A1C - Abnormal; Notable for the following:    Hgb A1c MFr Bld 6.0 (*)    All other components within normal limits  GLUCOSE, CAPILLARY - Abnormal; Notable for the following:    Glucose-Capillary 125 (*)    All other components within normal limits  RENAL FUNCTION PANEL - Abnormal; Notable for the following:    Chloride 115 (*)    Glucose, Bld 101 (*)    Creatinine, Ser 1.09 (*)    Calcium 8.6 (*)    Phosphorus 1.5 (*)    Albumin 2.3 (*)    GFR calc non Af Amer 47 (*)    GFR calc Af Amer 55 (*)    All other components within normal limits  GLUCOSE, CAPILLARY - Abnormal; Notable for the following:    Glucose-Capillary 103 (*)    All other components within normal limits  GLUCOSE, CAPILLARY - Abnormal; Notable for the following:    Glucose-Capillary 106 (*)    All other components within normal limits   GLUCOSE, CAPILLARY - Abnormal; Notable for the following:    Glucose-Capillary 111 (*)    All other components within normal limits  GLUCOSE, CAPILLARY - Abnormal; Notable for the following:    Glucose-Capillary 145 (*)    All other components within normal limits  GLUCOSE, CAPILLARY - Abnormal; Notable for the following:    Glucose-Capillary 136 (*)    All other components within normal limits  GLUCOSE, CAPILLARY - Abnormal; Notable for the following:    Glucose-Capillary 131 (*)    All other components within normal limits  CBG MONITORING, ED - Abnormal; Notable for the following:    Glucose-Capillary 262 (*)    All other components within normal limits  URINE CULTURE  MRSA PCR SCREENING  CULTURE, BLOOD (ROUTINE X 2)  CULTURE, BLOOD (ROUTINE X 2)  T3, FREE  GLUCOSE, CAPILLARY    Imaging Review No results found.   EKG Interpretation   Date/Time:  Wednesday January 07 2015 14:41:36 EDT Ventricular Rate:  87 PR Interval:  104 QRS Duration: 67 QT Interval:  371 QTC Calculation: 446 R Axis:   48 Text Interpretation:  Sinus rhythm Short PR interval Minimal ST  depression, lateral leads ED PHYSICIAN INTERPRETATION AVAILABLE IN [place]  [facility] Confirmed by TEST, Record ([id number]) on 01/08/2015 7:37:14 AM      MDM   Final diagnoses:  Altered awareness, transient  AKI (acute kidney injury)  Acute encephalopathy  Hyperglycemia        Virgel Manifold, MD 01/15/15 [phone]

## 2015-01-17 NOTE — Telephone Encounter (Signed)
Please call daughter/Sireen Halk --- I have ordered PET scan.

## 2015-01-19 NOTE — Telephone Encounter (Signed)
Spoke to [place] at [facility], I called that number because it was given in conversation.  [name] thought they were going to cancel the PET scan. She was going to call pt's daughter and get clarification on the situation. If they still need the PET scan; it is scheduled for 01/27/15 at 1pm at [facility].

## 2015-01-20 ENCOUNTER — Institutional Professional Consult (permissible substitution): Payer: Self-pay | Admitting: Internal Medicine

## 2015-01-27 ENCOUNTER — Encounter (HOSPITAL_COMMUNITY)
Admission: AD | Disposition: A | Discharge status: Home Health | Payer: Self-pay | Source: Ambulatory Visit | Attending: Vascular Surgery

## 2015-01-27 ENCOUNTER — Encounter (HOSPITAL_COMMUNITY): Payer: Medicare Other | Admitting: Certified Registered Nurse Anesthetist

## 2015-01-27 ENCOUNTER — Encounter (HOSPITAL_COMMUNITY): Payer: Self-pay | Admitting: Certified Registered Nurse Anesthetist

## 2015-01-27 ENCOUNTER — Other Ambulatory Visit: Payer: Self-pay

## 2015-01-27 ENCOUNTER — Inpatient Hospital Stay (HOSPITAL_COMMUNITY)
Admission: AD | Admit: 2015-01-27 | Discharge: 2015-02-03 | DRG: 272 | Disposition: A | Discharge status: Home Health | Payer: Medicare Other | Source: Ambulatory Visit | Attending: Vascular Surgery | Admitting: Vascular Surgery

## 2015-01-27 ENCOUNTER — Inpatient Hospital Stay (HOSPITAL_COMMUNITY): Payer: Medicare Other

## 2015-01-27 ENCOUNTER — Encounter (HOSPITAL_COMMUNITY)
Admission: RE | Admit: 2015-01-27 | Discharge: 2015-01-27 | Disposition: A | Discharge status: Home / Self Care | Payer: Medicare Other | Source: Ambulatory Visit | Attending: Family Medicine | Admitting: Family Medicine

## 2015-01-27 DIAGNOSIS — Z9861 Coronary angioplasty status: Secondary | ICD-10-CM

## 2015-01-27 DIAGNOSIS — I713 Abdominal aortic aneurysm, ruptured, unspecified: Secondary | ICD-10-CM | POA: Diagnosis present

## 2015-01-27 DIAGNOSIS — I2510 Atherosclerotic heart disease of native coronary artery without angina pectoris: Secondary | ICD-10-CM | POA: Diagnosis present

## 2015-01-27 DIAGNOSIS — F17210 Nicotine dependence, cigarettes, uncomplicated: Secondary | ICD-10-CM | POA: Diagnosis present

## 2015-01-27 DIAGNOSIS — Z79899 Other long term (current) drug therapy: Secondary | ICD-10-CM

## 2015-01-27 DIAGNOSIS — I252 Old myocardial infarction: Secondary | ICD-10-CM

## 2015-01-27 DIAGNOSIS — Z9889 Other specified postprocedural states: Secondary | ICD-10-CM | POA: Insufficient documentation

## 2015-01-27 DIAGNOSIS — E785 Hyperlipidemia, unspecified: Secondary | ICD-10-CM | POA: Diagnosis present

## 2015-01-27 DIAGNOSIS — R918 Other nonspecific abnormal finding of lung field: Secondary | ICD-10-CM

## 2015-01-27 DIAGNOSIS — D491 Neoplasm of unspecified behavior of respiratory system: Secondary | ICD-10-CM | POA: Diagnosis present

## 2015-01-27 DIAGNOSIS — Z888 Allergy status to other drugs, medicaments and biological substances status: Secondary | ICD-10-CM | POA: Diagnosis not present

## 2015-01-27 DIAGNOSIS — I10 Essential (primary) hypertension: Secondary | ICD-10-CM | POA: Diagnosis present

## 2015-01-27 DIAGNOSIS — M810 Age-related osteoporosis without current pathological fracture: Secondary | ICD-10-CM | POA: Diagnosis present

## 2015-01-27 DIAGNOSIS — Z7982 Long term (current) use of aspirin: Secondary | ICD-10-CM

## 2015-01-27 DIAGNOSIS — Z982 Presence of cerebrospinal fluid drainage device: Secondary | ICD-10-CM

## 2015-01-27 DIAGNOSIS — Z885 Allergy status to narcotic agent status: Secondary | ICD-10-CM

## 2015-01-27 DIAGNOSIS — Z881 Allergy status to other antibiotic agents status: Secondary | ICD-10-CM

## 2015-01-27 DIAGNOSIS — F0390 Unspecified dementia without behavioral disturbance: Secondary | ICD-10-CM | POA: Diagnosis present

## 2015-01-27 HISTORY — PX: LYSIS OF ADHESION: SHX5961

## 2015-01-27 HISTORY — PX: ABDOMINAL AORTIC ANEURYSM REPAIR: SHX42

## 2015-01-27 HISTORY — PX: FEMORAL-FEMORAL BYPASS GRAFT: SHX936

## 2015-01-27 LAB — APTT: APTT: 30 s (ref 24–37)

## 2015-01-27 LAB — COMPREHENSIVE METABOLIC PANEL
ALK PHOS: 91 U/L (ref 38–126)
ALT: 12 U/L — AB (ref 14–54)
AST: 20 U/L (ref 15–41)
Albumin: 3.1 g/dL — ABNORMAL LOW (ref 3.5–5.0)
Anion gap: 9 (ref 5–15)
BUN: 18 mg/dL (ref 6–20)
CHLORIDE: 104 mmol/L (ref 101–111)
CO2: 26 mmol/L (ref 22–32)
Calcium: 10.1 mg/dL (ref 8.9–10.3)
Creatinine, Ser: 1.66 mg/dL — ABNORMAL HIGH (ref 0.44–1.00)
GFR calc Af Amer: 33 mL/min — ABNORMAL LOW (ref >60)
GFR, EST NON AFRICAN AMERICAN: 28 mL/min — AB (ref >60)
GLUCOSE: 131 mg/dL — AB (ref 65–99)
POTASSIUM: 4.3 mmol/L (ref 3.5–5.1)
Sodium: 139 mmol/L (ref 135–145)
TOTAL PROTEIN: 6.9 g/dL (ref 6.5–8.1)
Total Bilirubin: 0.6 mg/dL (ref 0.3–1.2)

## 2015-01-27 LAB — CBC
HCT: 38.5 % (ref 36.0–46.0)
Hemoglobin: 11.9 g/dL — ABNORMAL LOW (ref 12.0–15.0)
MCH: 28.6 pg (ref 26.0–34.0)
MCHC: 30.9 g/dL (ref 30.0–36.0)
MCV: 92.5 fL (ref 78.0–100.0)
PLATELETS: 435 10*3/uL — AB (ref 150–400)
RBC: 4.16 MIL/uL (ref 3.87–5.11)
RDW: 15.5 % (ref 11.5–15.5)
WBC: 7.1 10*3/uL (ref 4.0–10.5)

## 2015-01-27 LAB — POCT I-STAT 7, (LYTES, BLD GAS, ICA,H+H)
Acid-base deficit: 1 mmol/L (ref 0.0–2.0)
Acid-base deficit: 7 mmol/L — ABNORMAL HIGH (ref 0.0–2.0)
Bicarbonate: 22.9 mEq/L (ref 20.0–24.0)
Bicarbonate: 18.6 mEq/L — ABNORMAL LOW (ref 20.0–24.0)
Calcium, Ion: 1.26 mmol/L (ref 1.13–1.30)
Calcium, Ion: 1.14 mmol/L (ref 1.13–1.30)
HCT: 23 % — ABNORMAL LOW (ref 36.0–46.0)
HCT: 26 % — ABNORMAL LOW (ref 36.0–46.0)
HEMOGLOBIN: 8.8 g/dL — AB (ref 12.0–15.0)
Hemoglobin: 7.8 g/dL — ABNORMAL LOW (ref 12.0–15.0)
O2 SAT: 99 %
O2 Saturation: 100 %
POTASSIUM: 4 mmol/L (ref 3.5–5.1)
POTASSIUM: 4.2 mmol/L (ref 3.5–5.1)
Patient temperature: 36.3
Patient temperature: 36.6
SODIUM: 140 mmol/L (ref 135–145)
Sodium: 134 mmol/L — ABNORMAL LOW (ref 135–145)
TCO2: 24 mmol/L (ref 0–100)
TCO2: 20 mmol/L (ref 0–100)
pCO2 arterial: 33.9 mmHg — ABNORMAL LOW (ref 35.0–45.0)
pCO2 arterial: 37.1 mmHg (ref 35.0–45.0)
pH, Arterial: 7.434 (ref 7.350–7.450)
pH, Arterial: 7.307 — ABNORMAL LOW (ref 7.350–7.450)
pO2, Arterial: 144 mmHg — ABNORMAL HIGH (ref 80.0–100.0)
pO2, Arterial: 264 mmHg — ABNORMAL HIGH (ref 80.0–100.0)

## 2015-01-27 LAB — PROTIME-INR
INR: 1.1 (ref 0.00–1.49)
PROTHROMBIN TIME: 14.4 s (ref 11.6–15.2)

## 2015-01-27 LAB — PREPARE RBC (CROSSMATCH)

## 2015-01-27 LAB — GLUCOSE, CAPILLARY: Glucose-Capillary: 93 mg/dL (ref 65–99)

## 2015-01-27 SURGERY — ANEURYSM ABDOMINAL AORTIC REPAIR
Anesthesia: General | Site: Groin

## 2015-01-27 MED ORDER — DEXTROSE 5 % IV SOLN
10.0000 mg | INTRAVENOUS | Status: DC | PRN
  Administered 2015-01-27: 10 ug/min via INTRAVENOUS

## 2015-01-27 MED ORDER — LIDOCAINE HCL (CARDIAC) 20 MG/ML IV SOLN
INTRAVENOUS | Status: DC | PRN
  Administered 2015-01-27: 100 mg via INTRAVENOUS

## 2015-01-27 MED ORDER — ONDANSETRON HCL 4 MG/2ML IJ SOLN
INTRAMUSCULAR | Status: DC | PRN
  Administered 2015-01-27: 4 mg via INTRAVENOUS

## 2015-01-27 MED ORDER — GLYCOPYRROLATE 0.2 MG/ML IJ SOLN
INTRAMUSCULAR | Status: DC | PRN
  Administered 2015-01-27: 0.4 mg via INTRAVENOUS

## 2015-01-27 MED ORDER — PROPOFOL 10 MG/ML IV BOLUS
INTRAVENOUS | Status: DC | PRN
  Administered 2015-01-27: 120 mg via INTRAVENOUS

## 2015-01-27 MED ORDER — FLUDEOXYGLUCOSE F - 18 (FDG) INJECTION
6.4000 | Freq: Once | INTRAVENOUS | Status: AC | PRN
  Administered 2015-01-27: 6.4 via INTRAVENOUS

## 2015-01-27 MED ORDER — LABETALOL HCL 5 MG/ML IV SOLN
INTRAVENOUS | Status: DC | PRN
  Administered 2015-01-27: 20 mg via INTRAVENOUS
  Administered 2015-01-27 (×2): 10 mg via INTRAVENOUS

## 2015-01-27 MED ORDER — HEPARIN SODIUM (PORCINE) 1000 UNIT/ML IJ SOLN
INTRAMUSCULAR | Status: DC | PRN
  Administered 2015-01-27: 3000 [IU] via INTRAVENOUS
  Administered 2015-01-27: 5000 [IU] via INTRAVENOUS
  Administered 2015-01-27: 2000 [IU] via INTRAVENOUS

## 2015-01-27 MED ORDER — HYDROMORPHONE HCL 1 MG/ML IJ SOLN: 0.2500 mg | INTRAMUSCULAR | Status: DC | PRN

## 2015-01-27 MED ORDER — NEOSTIGMINE METHYLSULFATE 10 MG/10ML IV SOLN
INTRAVENOUS | Status: DC | PRN
  Administered 2015-01-27: 3.5 mg via INTRAVENOUS

## 2015-01-27 MED ORDER — LACTATED RINGERS IV SOLN
INTRAVENOUS | Status: DC | PRN
  Administered 2015-01-27 (×2): via INTRAVENOUS

## 2015-01-27 MED ORDER — FENTANYL CITRATE (PF) 100 MCG/2ML IJ SOLN
INTRAMUSCULAR | Status: DC | PRN
  Administered 2015-01-27: 50 ug via INTRAVENOUS
  Administered 2015-01-27: 100 ug via INTRAVENOUS
  Administered 2015-01-27 (×2): 50 ug via INTRAVENOUS
  Administered 2015-01-27 (×2): 100 ug via INTRAVENOUS
  Administered 2015-01-27: 150 ug via INTRAVENOUS
  Administered 2015-01-27: 50 ug via INTRAVENOUS
  Administered 2015-01-27: 100 ug via INTRAVENOUS

## 2015-01-27 MED ORDER — DEXTROSE 5 % IV SOLN
1.5000 g | INTRAVENOUS | Status: DC
  Filled 2015-01-27: qty 1.5

## 2015-01-27 MED ORDER — THROMBIN 20000 UNITS EX SOLR
CUTANEOUS | Status: AC
  Filled 2015-01-27: qty 20000

## 2015-01-27 MED ORDER — LACTATED RINGERS IV SOLN
INTRAVENOUS | Status: DC | PRN
  Administered 2015-01-27 (×2): via INTRAVENOUS

## 2015-01-27 MED ORDER — SODIUM CHLORIDE 0.9 % IV SOLN
INTRAVENOUS | Status: DC | PRN
  Administered 2015-01-27 (×3): via INTRAVENOUS

## 2015-01-27 MED ORDER — MANNITOL 25 % IV SOLN
INTRAVENOUS | Status: DC | PRN
  Administered 2015-01-27: 25 g via INTRAVENOUS

## 2015-01-27 MED ORDER — 0.9 % SODIUM CHLORIDE (POUR BTL) OPTIME
TOPICAL | Status: DC | PRN
Start: 2015-01-27 — End: 2015-01-27
  Administered 2015-01-27: 1000 mL

## 2015-01-27 MED ORDER — PROMETHAZINE HCL 25 MG/ML IJ SOLN: 6.2500 mg | INTRAMUSCULAR | Status: DC | PRN

## 2015-01-27 MED ORDER — SUCCINYLCHOLINE CHLORIDE 20 MG/ML IJ SOLN
INTRAMUSCULAR | Status: DC | PRN
  Administered 2015-01-27: 120 mg via INTRAVENOUS

## 2015-01-27 MED ORDER — PROTAMINE SULFATE 10 MG/ML IV SOLN
INTRAVENOUS | Status: DC | PRN
  Administered 2015-01-27: 15 mg via INTRAVENOUS
  Administered 2015-01-27: 50 mg via INTRAVENOUS

## 2015-01-27 MED ORDER — SODIUM CHLORIDE 0.9 % IR SOLN
Status: DC | PRN
  Administered 2015-01-27: 500 mL

## 2015-01-27 MED ORDER — MEPERIDINE HCL 25 MG/ML IJ SOLN: 6.2500 mg | INTRAMUSCULAR | Status: DC | PRN

## 2015-01-27 MED ORDER — ROCURONIUM BROMIDE 100 MG/10ML IV SOLN
INTRAVENOUS | Status: DC | PRN
  Administered 2015-01-27 (×3): 10 mg via INTRAVENOUS
  Administered 2015-01-27: 20 mg via INTRAVENOUS
  Administered 2015-01-27: 50 mg via INTRAVENOUS

## 2015-01-27 MED ORDER — HYDRALAZINE HCL 20 MG/ML IJ SOLN
INTRAMUSCULAR | Status: DC | PRN
  Administered 2015-01-27 (×2): 10 mg via INTRAVENOUS

## 2015-01-27 MED ORDER — NITROGLYCERIN IN D5W 200-5 MCG/ML-% IV SOLN
INTRAVENOUS | Status: DC | PRN
  Administered 2015-01-27: 5 ug/min via INTRAVENOUS

## 2015-01-27 MED ORDER — PHENYLEPHRINE HCL 10 MG/ML IJ SOLN
INTRAMUSCULAR | Status: DC | PRN
  Administered 2015-01-27: 80 ug via INTRAVENOUS

## 2015-01-27 MED ORDER — DEXTROSE 5 % IV SOLN
1.5000 g | INTRAVENOUS | Status: DC | PRN
  Administered 2015-01-27 (×2): 1.5 g via INTRAVENOUS

## 2015-01-27 SURGICAL SUPPLY — 69 items
ATTRACTOMAT 16X20 MAGNETIC DRP (DRAPES) ×4 IMPLANT
CANISTER SUCTION 2500CC (MISCELLANEOUS) ×4 IMPLANT
CANNULA VESSEL 3MM 2 BLNT TIP (CANNULA) ×8 IMPLANT
CATH EMB 3FR 80CM (CATHETERS) ×4 IMPLANT
CLIP TI MEDIUM 24 (CLIP) ×4 IMPLANT
CLIP TI WIDE RED SMALL 24 (CLIP) ×4 IMPLANT
DERMABOND ADVANCED (GAUZE/BANDAGES/DRESSINGS) ×4
DERMABOND ADVANCED .7 DNX12 (GAUZE/BANDAGES/DRESSINGS) ×4 IMPLANT
DRSG COVADERM 4X14 (GAUZE/BANDAGES/DRESSINGS) ×4 IMPLANT
DRSG COVADERM 4X6 (GAUZE/BANDAGES/DRESSINGS) ×8 IMPLANT
ELECT BLADE 4.0 EZ CLEAN MEGAD (MISCELLANEOUS) ×8
ELECT BLADE 6.5 EXT (BLADE) IMPLANT
ELECT REM PT RETURN 9FT ADLT (ELECTROSURGICAL) ×8
ELECTRODE BLDE 4.0 EZ CLN MEGD (MISCELLANEOUS) ×4 IMPLANT
ELECTRODE REM PT RTRN 9FT ADLT (ELECTROSURGICAL) ×4 IMPLANT
GLOVE BIO SURGEON STRL SZ7.5 (GLOVE) IMPLANT
GLOVE BIOGEL PI IND STRL 6.5 (GLOVE) ×2 IMPLANT
GLOVE BIOGEL PI IND STRL 7.5 (GLOVE) ×2 IMPLANT
GLOVE BIOGEL PI IND STRL 8 (GLOVE) ×2 IMPLANT
GLOVE BIOGEL PI INDICATOR 6.5 (GLOVE) ×2
GLOVE BIOGEL PI INDICATOR 7.5 (GLOVE) ×2
GLOVE BIOGEL PI INDICATOR 8 (GLOVE) ×2
GLOVE SURG SS PI 6.5 STRL IVOR (GLOVE) ×4 IMPLANT
GLOVE SURG SS PI 7.0 STRL IVOR (GLOVE) ×4 IMPLANT
GLOVE SURG SS PI 7.5 STRL IVOR (GLOVE) ×4 IMPLANT
GOWN STRL REUS W/ TWL LRG LVL3 (GOWN DISPOSABLE) ×8 IMPLANT
GOWN STRL REUS W/TWL LRG LVL3 (GOWN DISPOSABLE) ×8
GRAFT HEMASHIELD 18X9MM (Vascular Products) ×4 IMPLANT
INSERT FOGARTY 61MM (MISCELLANEOUS) ×8 IMPLANT
INSERT FOGARTY SM (MISCELLANEOUS) ×16 IMPLANT
KIT BASIN OR (CUSTOM PROCEDURE TRAY) ×4 IMPLANT
KIT ROOM TURNOVER OR (KITS) ×4 IMPLANT
LOOP VESSEL MAXI BLUE (MISCELLANEOUS) ×4 IMPLANT
LOOP VESSEL MINI RED (MISCELLANEOUS) ×4 IMPLANT
NS IRRIG 1000ML POUR BTL (IV SOLUTION) ×8 IMPLANT
PACK AORTA (CUSTOM PROCEDURE TRAY) ×4 IMPLANT
PAD ARMBOARD 7.5X6 YLW CONV (MISCELLANEOUS) ×8 IMPLANT
RETAINER VISCERA MED (MISCELLANEOUS) ×4 IMPLANT
SPONGE SURGIFOAM ABS GEL 100 (HEMOSTASIS) IMPLANT
STAPLER VISISTAT (STAPLE) IMPLANT
SUT ETHIBOND 5 LR DA (SUTURE) ×8 IMPLANT
SUT PDS AB 1 TP1 54 (SUTURE) ×16 IMPLANT
SUT PROLENE 2 0 MH 48 (SUTURE) IMPLANT
SUT PROLENE 3 0 SH 48 (SUTURE) ×12 IMPLANT
SUT PROLENE 3 0 SH DA (SUTURE) ×8 IMPLANT
SUT PROLENE 3 0 SH1 36 (SUTURE) IMPLANT
SUT PROLENE 4 0 RB 1 (SUTURE) ×4
SUT PROLENE 4-0 RB1 18X2 ARM (SUTURE) ×4 IMPLANT
SUT PROLENE 5 0 C 1 24 (SUTURE) ×28 IMPLANT
SUT PROLENE 5 0 C 1 36 (SUTURE) IMPLANT
SUT SILK 2 0 (SUTURE) ×2
SUT SILK 2 0 SH (SUTURE) ×4 IMPLANT
SUT SILK 2 0 SH CR/8 (SUTURE) ×8 IMPLANT
SUT SILK 2-0 18XBRD TIE 12 (SUTURE) ×2 IMPLANT
SUT SILK 3 0 (SUTURE) ×2
SUT SILK 3-0 18XBRD TIE 12 (SUTURE) ×2 IMPLANT
SUT SILK 4 0 (SUTURE) ×2
SUT SILK 4-0 18XBRD TIE 12 (SUTURE) ×2 IMPLANT
SUT VIC AB 2-0 CT1 27 (SUTURE) ×2
SUT VIC AB 2-0 CT1 TAPERPNT 27 (SUTURE) ×2 IMPLANT
SUT VIC AB 2-0 CTB1 (SUTURE) ×32 IMPLANT
SUT VIC AB 3-0 SH 27 (SUTURE) ×14
SUT VIC AB 3-0 SH 27X BRD (SUTURE) ×14 IMPLANT
SUT VICRYL 4-0 PS2 18IN ABS (SUTURE) ×20 IMPLANT
SYR 3ML LL SCALE MARK (SYRINGE) ×4 IMPLANT
TOWEL BLUE STERILE X RAY DET (MISCELLANEOUS) ×8 IMPLANT
TRAY FOLEY CATH SILVER 16FR LF (SET/KITS/TRAYS/PACK) ×4 IMPLANT
TRAY FOLEY W/METER SILVER 16FR (SET/KITS/TRAYS/PACK) IMPLANT
WATER STERILE IRR 1000ML POUR (IV SOLUTION) ×4 IMPLANT

## 2015-01-27 NOTE — H&P (Signed)
[facility] [place]  Patient name: [name] MRN: [id number] DOB: June 21, 1935 Sex: female  REASON FOR ADMISSION: Ruptured abdominal aortic aneurysm.  HPI: [name] is a 79 y.o. female who is being worked up for a lung nodule. She had a PET scan performed today and the CT of the abdomen and pelvis showed a contained ruptured abdominal aortic aneurysm. She was sent urgently to [facility] for vascular surgery consultation. The patient had been having some abdominal pain but currently is not having abdominal pain. She is oriented to place but was not oriented to time. I did speak with her daughter who said she's had some urinary tract infections recently and gets confused when she is having these infections. The situation is further complicated by the fact that she has a VP shunt.  She does have a significant cardiac history although recent cardiac workup was fairly unremarkable. She is undergone previous PTCA in 1995 and she has a remote history of a heart attack. Currently denies any chest pain or chest pressure.  Past Medical History  Diagnosis Date  . Hyperlipidemia   . Essential hypertension, benign   . CAD (coronary artery disease)   . Osteoporosis   . Hypercalcemia   . Vitamin D deficiency   . Ovarian tumor   . Breast tumor   . MI (myocardial infarction) July 1995    Treated with LAD PCI- Dr Adora Fridge  . Cataract   . Complication of anesthesia   . PONV (postoperative nausea and vomiting)   . Lung tumor     recently found approx 25 days ago-Kristen Tamala Julian, MD    Family History  Problem Relation Age of Onset  . Stroke Mother   . Heart disease Father   . Cancer Sister     SOCIAL HISTORY: History  Substance Use Topics  . Smoking status: Current Some Day Smoker -- 1.00 packs/day for 60 years    Types: Cigarettes  . Smokeless tobacco: Not on file  . Alcohol Use: No    Allergies  Allergen Reactions  . Sulfa Antibiotics Swelling  .  Codeine Nausea And Vomiting  . Hydrocodone Other (See Comments)    Confusion  . Other Nausea And Vomiting    Some type of pain pill given for allergic reaction     No current facility-administered medications for this encounter.   Current Outpatient Prescriptions  Medication Sig Dispense Refill  . aspirin 81 MG tablet Take 81 mg by mouth daily.    Marland Kitchen loratadine (CLARITIN) 10 MG tablet Take 1 tablet (10 mg total) by mouth daily as needed for allergies.    . methimazole (TAPAZOLE) 5 MG tablet Take 3 tablets (15 mg total) by mouth 3 (three) times daily.    . metoprolol succinate (TOPROL-XL) 50 MG 24 hr tablet Take 1 tablet (50 mg total) by mouth daily. 30 tablet 9  . Multiple Vitamin (MULTIVITAMIN WITH MINERALS) TABS tablet Take 1 tablet by mouth daily. 30 tablet 0  . pravastatin (PRAVACHOL) 40 MG tablet Take 1 tablet (40 mg total) by mouth daily. 30 tablet 10  . risperiDONE (RISPERDAL) 0.5 MG tablet Take 1 tablet (0.5 mg total) by mouth at bedtime. 30 tablet 5  . vitamin B-12 (CYANOCOBALAMIN) 1000 MCG tablet Take 1 tablet (1,000 mcg total) by mouth daily. 30 tablet 0    REVIEW OF SYSTEMS: Given the urgency of the situation, a limited review of systems was obtained. She denies any chest pain or chest pressure.  She denies significant dyspnea at rest.  PHYSICAL EXAM: There were no vitals filed for this visit. There is no weight on file to calculate BMI. GENERAL: The patient is a well-nourished female, in no acute distress. The vital signs are documented above. CARDIAC: There is a regular rate and rhythm.  VASCULAR: She has a palpable right dorsalis pedis pulse. I cannot palpate pulses in the left foot. PULMONARY: There is good air exchange bilaterally without wheezing or rales. ABDOMEN: Soft and non-tender with normal pitched bowel sounds.  MUSCULOSKELETAL: There are no major deformities. NEUROLOGIC: No focal weakness or paresthesias are detected. SKIN: There are no ulcers or rashes  noted. PSYCHIATRIC: The patient has a normal affect.  DATA:  Lab Results  Component Value Date   WBC 13.5* 01/08/2015   HGB 8.8* 01/08/2015   HCT 27.2* 01/08/2015   MCV 88.6 01/08/2015   PLT 276 01/08/2015   Lab Results  Component Value Date   NA 142 01/09/2015   K 3.8 01/09/2015   CL 115* 01/09/2015   CO2 22 01/09/2015   Lab Results  Component Value Date   CREATININE 1.09* 01/09/2015   Lab Results  Component Value Date   INR 0.96 12/19/2013   INR 1.0 11/29/2008   Lab Results  Component Value Date   HGBA1C 6.0* 01/08/2015   CBG (last 3)   Recent Labs  01/27/15 1229  GLUCAP 93   I reviewed her CT scan today which shows a contained ruptured abdominal aortic aneurysm. There is disruption of the wall and the posterior lateral surface on the left. He has significant calcific iliac artery occlusive disease and for this reason I do not think she is a candidate for an endovascular aneurysm repair.  MEDICAL ISSUES:  RUPTURED ABDOMINAL AORTIC ANEURYSM: This patient has a ruptured abdominal aortic aneurysm that is contained by CT scan. The plan is for urgent open repair of the aneurysm given that she does not appear to be a candidate for an endovascular repair given her significant iliac artery occlusive disease. I have discussed the procedure and indications with the patient and her daughter. The risk of mortality is 50%. We have discussed the potential complications including but not limited to bleeding, infection, myocardial infarction, renal insufficiency, pulmonary failure, and embolic disease. I have discussed the VP shunt with neurosurgery. They state that as long as there are no obvious signs of infection this does not need to be externalized. The patient denies previous abdominal surgery. We will proceed urgently.  Deitra Mayo [facility] [place]: [phone]

## 2015-01-27 NOTE — Transfer of Care (Signed)
Immediate Anesthesia Transfer of Care Note  Patient: [name]  Procedure(s) Performed: Procedure(s): Resection and Grafting Ruptured Abdominal Aortic Aneurysm with Aorta-Right Femoral, Left Common Iliac Graft (N/A) LYSIS OF ADHESIONS (N/A) BYPASS GRAFT FEMORAL-FEMORAL ARTERY (N/A)  Patient Location: PACU  Anesthesia Type:General  Level of Consciousness: awake  Airway & Oxygen Therapy: Patient connected to face mask oxygen  Post-op Assessment: Report given to RN and Post -op Vital signs reviewed and stable  Post vital signs: Reviewed and stable  Last Vitals:  Filed Vitals:   01/27/15 2336  Temp: 39.7 C    Complications: No apparent anesthesia complications

## 2015-01-27 NOTE — Op Note (Signed)
NAME: [name]   MRN: [id number] DOB: 10/01/1934    DATE OF OPERATION: 01/27/2015  PREOP DIAGNOSIS: Ruptured abdominal aortic aneurysm  POSTOP DIAGNOSIS: Same  PROCEDURE:  1. Open repair of ruptured abdominal aortic aneurysm 2. Aorto right femoral left common iliac artery bypass (18 x 9 Dacron graft) 3. Right to left femorofemoral bypass with 9 mm Dacron graft 4. Lysis of adhesions  SURGEON: Judeth Cornfield. Scot Dock, MD, FACS  ASSIST: Gerri Lins PA  ANESTHESIA: Gen.   EBL: per anesthesia record.  INDICATIONS: [name] is a 80 y.o. female who was recently found to have lung nodules. Part of the workup included a PET scan. The CT component showed that she had a ruptured abdominal aortic aneurysm which was contained. The aneurysm had blown out the posterior lateral wall. It is not clear how long this had been present.  FINDINGS: there was complete disruption of the posterior lateral wall of the aneurysm with significant inflammation in the retroperitoneum on the left side.  TECHNIQUE: The patient was taken to the operating room and received a general anesthetic after monitoring lines were placed by anesthesia. The abdomen and groins were prepped and draped in usual sterile fashion.  The abdomen was entered through a midline incision. There were significant adhesions present from previous cholecystectomy and these adhesions were taken down to allow mobilization of the transverse colon and small intestine. Exploratory laparotomy revealed significant retroperitoneal inflammation and swelling on the left side. In addition there was a Meckel's diverticulum and the small intestine. The transverse colon was mobilized superiorly and the small bowel reflected to the right after the small bowel was mobilized. The retro-perineal tissue was divided to the right of the aneurysm and the dissection carried up to the left renal vein. I was able to dissect out the infrarenal aorta below  the renal arteries enough to apply a clamp. The dissection was carried down onto the right common iliac artery which was significantly diseased. I did not think I would be able to sew at this level. Likewise the common iliac artery on the left was significantly diseased. Therefore elected to make groin incisions. On the right side because of her body habitus I elected to make an oblique incision above the inguinal crease. However she had extensive plaque in the common femoral superficial femoral and deep femoral arteries. This made the dissection quite difficult through the oblique incision. However I was able to expose well above the inguinal ligament or active place a clamp. In addition the superficial femoral and deep femoral arteries were controlled with vessel loops. On the left side given the extensive disease I elected to make a longitudinal incision. Through this incision the common femoral, deep femoral, and superficial femoral arteries were controlled. I started retroperitoneal tunnels from both sides.  The patient was then heparinized. Patient also received 25 g of mannitol. Clamps were placed on the left common iliac artery and one area that could be clamped. The very proximal right common iliac artery was also clamped. The infrarenal aorta was then clamped. The aneurysm was then opened and a large amount of calcific plaque removed to allow oversewing of the lumbar arteries. The posterior wall the infrarenal neck was left intact. An 18 x 9 graft was selected. This was cut to the appropriate length. This was sewn and 2 and to the infrarenal aorta. A felt cuff was used anteriorly. After completing the anastomosis this was tested and was hemostatic. On the right common iliac  artery endarterectomized the calcific plaque so that I could oversew the right common iliac artery origin. The right limb of the grafts and brought to the previously created tunnel for anastomosis to the right common femoral artery. I  was unable to create a retroperitoneal plane on the left because of extensive scar tissue and I did not want to risk injury to the ureter or get into the contained ruptured aneurysm. This reason I elected to sewn to the common iliac artery which required extensive endarterectomy. I initially approached the left common iliac artery anastomosis. The left limb of the grafts To the appropriate length and sewn end to end to the proximal left common iliac artery which had been endarterectomized. Initially there was good flow beyond the anastomosis. Attention was then turned to the right femoral anastomosis. After all the vessels were controlled was a soft spot anteriorly which was opened longitudinally. This was extended and then the right limb of the graft, cut the appropriate length, and sewn end to side to the right common femoral artery using continuous 50 proline suture. At the completion was good signals with the Doppler in both groins.  The heparin was partially reversed with protamine. There was good hemostasis. The retroperitoneal tissue was closed with running 2-0 Vicryl. The aneurysm sac had been closed over the graft with running 2-0 Vicryl also. The abdominal contents were returned to their normal position. The patient had a VP shunt which was returned to its position. The fascial layer was then closed with 2 #1 PDS sutures. The subcutaneous layer was closed with 2 running 3-0 Vicryl's. The skin was closed with 2 4-0 Vicryl.  On the left side at this point I was not happy with the Doppler flow in the left common femoral artery. As I was unable to tunnel to do an aortofemoral graft on the left I elected to do a right to left femorofemoral graft. A segment of the 9 mm graft which was remaining was tunneled between the 2 incisions. On the left side, the external iliac artery, deep femoral artery, and superficial femoral arteries were controlled. A longitudinal arteriotomy was made in the common femoral  artery. The graft was spatulated and sewn into side to the common femoral artery using continuous 50 proline suture. Next the right limb of the aortofemoral graft was clamped proximal and distally and longitudinal graftotomy made in the right limb of the graft. The femorofemoral graft was sewn into side to the right limb of the aortofemoral graft with continuous 50 proline suture. At this point was excellent Doppler signals in both groins.  Each of the groin incisions was closed with a deeper 2-0 Vicryl, subcutaneous layer of 2-0 Vicryl and the skin closed with a 40 septic stitch. At the completion both feet were pink and appeared adequately perfused. Sterile dressing was applied. The patient tolerated the procedure well and transferred to the recovery room in stable condition. All needle and sponge counts were correct.  Deitra Mayo, MD, FACS [facility]  DATE OF DICTATION:   01/27/2015

## 2015-01-27 NOTE — Anesthesia Preprocedure Evaluation (Signed)
Anesthesia Evaluation  Patient identified by MRN, date of birth, ID band Patient awake and Patient confused    Reviewed: Allergy & Precautions, H&P , NPO status , Patient's Chart, lab work & pertinent test results, reviewed documented beta blocker date and time   History of Anesthesia Complications (+) PONV and history of anesthetic complications  Airway Mallampati: II  TM Distance: >3 FB Neck ROM: Full    Dental  (+) Edentulous Upper, Dental Advisory Given   Pulmonary Current Smoker, former smoker,  breath sounds clear to auscultation  Pulmonary exam normal       Cardiovascular hypertension, Pt. on medications and Pt. on home beta blockers + CAD and + Past MI Normal cardiovascular examRhythm:Regular Rate:Normal     Neuro/Psych PSYCHIATRIC DISORDERS    GI/Hepatic negative GI ROS, Neg liver ROS,   Endo/Other  negative endocrine ROSHyperthyroidism   Renal/GU Renal disease     Musculoskeletal   Abdominal   Peds  Hematology  (+) anemia ,   Anesthesia Other Findings   Reproductive/Obstetrics                             Anesthesia Physical  Anesthesia Plan  ASA: IV and emergent  Anesthesia Plan: General   Post-op Pain Management:    Induction: Intravenous, Cricoid pressure planned and Rapid sequence  Airway Management Planned: Oral ETT  Additional Equipment: Arterial line, CVP and Ultrasound Guidance Line Placement  Intra-op Plan:   Post-operative Plan: Possible Post-op intubation/ventilation  Informed Consent: I have reviewed the patients History and Physical, chart, labs and discussed the procedure including the risks, benefits and alternatives for the proposed anesthesia with the patient or authorized representative who has indicated his/her understanding and acceptance.   Dental advisory given  Plan Discussed with: CRNA  Anesthesia Plan Comments:         Anesthesia  Quick Evaluation

## 2015-01-27 NOTE — Anesthesia Procedure Notes (Signed)
Procedure Name: Intubation Date/Time: 01/27/2015 6:06 PM Performed by: Willeen Cass P Pre-anesthesia Checklist: Patient identified, Timeout performed, Emergency Drugs available, Suction available and Patient being monitored Patient Re-evaluated:Patient Re-evaluated prior to inductionOxygen Delivery Method: Circle system utilized Preoxygenation: Pre-oxygenation with 100% oxygen Intubation Type: IV induction, Rapid sequence and Cricoid Pressure applied Laryngoscope Size: Mac and 3 Grade View: Grade I Tube type: Subglottic suction tube Tube size: 8.0 mm Number of attempts: 1 Airway Equipment and Method: Stylet Placement Confirmation: ETT inserted through vocal cords under direct vision,  breath sounds checked- equal and bilateral and positive ETCO2 Secured at: 22 cm Tube secured with: Tape Dental Injury: Teeth and Oropharynx as per pre-operative assessment

## 2015-01-27 NOTE — Telephone Encounter (Signed)
Radiologist called and reported the PET scan showed a ruptured abdominal aortic aneurysm currently contained with large blood in retroperitoneal space. He estimated rupture occurred 1 week ago. Pt is now at home with daughter. Called daughter and informed of results. She will call EMS to take to [facility] emergently. [facility] has been notified as well as the charge nurse.

## 2015-01-28 ENCOUNTER — Encounter (HOSPITAL_COMMUNITY): Payer: Self-pay | Admitting: Vascular Surgery

## 2015-01-28 ENCOUNTER — Inpatient Hospital Stay (HOSPITAL_COMMUNITY): Payer: Medicare Other

## 2015-01-28 LAB — CBC
HCT: 39.7 % (ref 36.0–46.0)
HEMATOCRIT: 39 % (ref 36.0–46.0)
Hemoglobin: 12.7 g/dL (ref 12.0–15.0)
Hemoglobin: 12.8 g/dL (ref 12.0–15.0)
MCH: 29 pg (ref 26.0–34.0)
MCH: 29.1 pg (ref 26.0–34.0)
MCHC: 32.2 g/dL (ref 30.0–36.0)
MCHC: 32.6 g/dL (ref 30.0–36.0)
MCV: 89.2 fL (ref 78.0–100.0)
MCV: 89.8 fL (ref 78.0–100.0)
Platelets: 353 10*3/uL (ref 150–400)
Platelets: 370 10*3/uL (ref 150–400)
RBC: 4.37 MIL/uL (ref 3.87–5.11)
RBC: 4.42 MIL/uL (ref 3.87–5.11)
RDW: 15.8 % — ABNORMAL HIGH (ref 11.5–15.5)
RDW: 16.2 % — AB (ref 11.5–15.5)
WBC: 20 10*3/uL — AB (ref 4.0–10.5)
WBC: 21 10*3/uL — ABNORMAL HIGH (ref 4.0–10.5)

## 2015-01-28 LAB — COMPREHENSIVE METABOLIC PANEL
ALBUMIN: 2.1 g/dL — AB (ref 3.5–5.0)
ALT: 30 U/L (ref 14–54)
AST: 46 U/L — AB (ref 15–41)
Alkaline Phosphatase: 84 U/L (ref 38–126)
Anion gap: 6 (ref 5–15)
BILIRUBIN TOTAL: 1.1 mg/dL (ref 0.3–1.2)
BUN: 18 mg/dL (ref 6–20)
CO2: 19 mmol/L — ABNORMAL LOW (ref 22–32)
Calcium: 7.8 mg/dL — ABNORMAL LOW (ref 8.9–10.3)
Chloride: 113 mmol/L — ABNORMAL HIGH (ref 101–111)
Creatinine, Ser: 1.37 mg/dL — ABNORMAL HIGH (ref 0.44–1.00)
GFR calc Af Amer: 42 mL/min — ABNORMAL LOW (ref >60)
GFR, EST NON AFRICAN AMERICAN: 36 mL/min — AB (ref >60)
GLUCOSE: 252 mg/dL — AB (ref 65–99)
POTASSIUM: 4.3 mmol/L (ref 3.5–5.1)
Sodium: 138 mmol/L (ref 135–145)
TOTAL PROTEIN: 4.5 g/dL — AB (ref 6.5–8.1)

## 2015-01-28 LAB — BASIC METABOLIC PANEL
ANION GAP: 6 (ref 5–15)
BUN: 16 mg/dL (ref 6–20)
CHLORIDE: 111 mmol/L (ref 101–111)
CO2: 20 mmol/L — ABNORMAL LOW (ref 22–32)
Calcium: 8 mg/dL — ABNORMAL LOW (ref 8.9–10.3)
Creatinine, Ser: 1.3 mg/dL — ABNORMAL HIGH (ref 0.44–1.00)
GFR calc non Af Amer: 38 mL/min — ABNORMAL LOW (ref >60)
GFR, EST AFRICAN AMERICAN: 44 mL/min — AB (ref >60)
GLUCOSE: 270 mg/dL — AB (ref 65–99)
Potassium: 4.7 mmol/L (ref 3.5–5.1)
SODIUM: 137 mmol/L (ref 135–145)

## 2015-01-28 LAB — BLOOD GAS, ARTERIAL
Acid-base deficit: 5 mmol/L — ABNORMAL HIGH (ref 0.0–2.0)
Bicarbonate: 20 mEq/L (ref 20.0–24.0)
O2 Content: 10 L/min
O2 SAT: 94.2 %
PCO2 ART: 40.3 mmHg (ref 35.0–45.0)
PO2 ART: 80.4 mmHg (ref 80.0–100.0)
Patient temperature: 99.2
TCO2: 21.2 mmol/L (ref 0–100)
pH, Arterial: 7.319 — ABNORMAL LOW (ref 7.350–7.450)

## 2015-01-28 LAB — MAGNESIUM
MAGNESIUM: 1.5 mg/dL — AB (ref 1.7–2.4)
Magnesium: 1.5 mg/dL — ABNORMAL LOW (ref 1.7–2.4)

## 2015-01-28 LAB — APTT: APTT: 28 s (ref 24–37)

## 2015-01-28 LAB — PROTIME-INR
INR: 1.3 (ref 0.00–1.49)
PROTHROMBIN TIME: 16.3 s — AB (ref 11.6–15.2)

## 2015-01-28 LAB — AMYLASE: AMYLASE: 38 U/L (ref 28–100)

## 2015-01-28 MED ORDER — SODIUM CHLORIDE 0.9 % IV SOLN: 500.0000 mL | Freq: Once | INTRAVENOUS | Status: AC | PRN

## 2015-01-28 MED ORDER — MAGNESIUM SULFATE 2 GM/50ML IV SOLN
2.0000 g | Freq: Every day | INTRAVENOUS | Status: DC | PRN
  Filled 2015-01-28: qty 50

## 2015-01-28 MED ORDER — METOPROLOL TARTRATE 1 MG/ML IV SOLN
2.0000 mg | INTRAVENOUS | Status: AC | PRN
  Administered 2015-01-28 – 2015-01-29 (×2): 5 mg via INTRAVENOUS
  Filled 2015-01-28 (×2): qty 5

## 2015-01-28 MED ORDER — DEXTROSE 5 % IV SOLN
1.5000 g | Freq: Two times a day (BID) | INTRAVENOUS | Status: AC
  Administered 2015-01-28 (×2): 1.5 g via INTRAVENOUS
  Filled 2015-01-28 (×2): qty 1.5

## 2015-01-28 MED ORDER — LABETALOL HCL 5 MG/ML IV SOLN
10.0000 mg | INTRAVENOUS | Status: DC | PRN
  Administered 2015-01-29 – 2015-01-30 (×2): 10 mg via INTRAVENOUS
  Filled 2015-01-28 (×4): qty 4

## 2015-01-28 MED ORDER — HYDRALAZINE HCL 20 MG/ML IJ SOLN
5.0000 mg | INTRAMUSCULAR | Status: AC | PRN
  Administered 2015-01-29 (×2): 5 mg via INTRAVENOUS
  Filled 2015-01-28 (×2): qty 1

## 2015-01-28 MED ORDER — CETYLPYRIDINIUM CHLORIDE 0.05 % MT LIQD
7.0000 mL | Freq: Two times a day (BID) | OROMUCOSAL | Status: DC
  Administered 2015-01-28 – 2015-01-30 (×6): 7 mL via OROMUCOSAL

## 2015-01-28 MED ORDER — ENOXAPARIN SODIUM 30 MG/0.3ML ~~LOC~~ SOLN
30.0000 mg | SUBCUTANEOUS | Status: DC
  Administered 2015-01-28 – 2015-02-03 (×7): 30 mg via SUBCUTANEOUS
  Filled 2015-01-28 (×8): qty 0.3

## 2015-01-28 MED ORDER — DOCUSATE SODIUM 100 MG PO CAPS
100.0000 mg | ORAL_CAPSULE | Freq: Every day | ORAL | Status: DC
  Administered 2015-01-31 – 2015-02-01 (×2): 100 mg via ORAL
  Filled 2015-01-28 (×5): qty 1

## 2015-01-28 MED ORDER — PANTOPRAZOLE SODIUM 40 MG IV SOLR
40.0000 mg | Freq: Every day | INTRAVENOUS | Status: DC
  Administered 2015-01-28 – 2015-01-30 (×3): 40 mg via INTRAVENOUS
  Filled 2015-01-28 (×5): qty 40

## 2015-01-28 MED ORDER — GUAIFENESIN-DM 100-10 MG/5ML PO SYRP: 15.0000 mL | ORAL_SOLUTION | ORAL | Status: DC | PRN

## 2015-01-28 MED ORDER — PANTOPRAZOLE SODIUM 40 MG PO TBEC: 40.0000 mg | DELAYED_RELEASE_TABLET | Freq: Every day | ORAL | Status: DC

## 2015-01-28 MED ORDER — ONDANSETRON HCL 4 MG/2ML IJ SOLN: 4.0000 mg | Freq: Four times a day (QID) | INTRAMUSCULAR | Status: DC | PRN

## 2015-01-28 MED ORDER — SODIUM CHLORIDE 0.9 % IV SOLN
INTRAVENOUS | Status: DC
  Administered 2015-01-28 – 2015-01-30 (×3): via INTRAVENOUS

## 2015-01-28 MED ORDER — ALUM & MAG HYDROXIDE-SIMETH 200-200-20 MG/5ML PO SUSP: 15.0000 mL | ORAL | Status: DC | PRN

## 2015-01-28 MED ORDER — PHENOL 1.4 % MT LIQD: 1.0000 | OROMUCOSAL | Status: DC | PRN

## 2015-01-28 MED ORDER — BISACODYL 10 MG RE SUPP: 10.0000 mg | Freq: Every day | RECTAL | Status: DC | PRN

## 2015-01-28 MED ORDER — NITROGLYCERIN IN D5W 200-5 MCG/ML-% IV SOLN
0.0000 ug/min | INTRAVENOUS | Status: DC
  Administered 2015-01-28: 20 ug/min via INTRAVENOUS
  Administered 2015-01-28 – 2015-01-29 (×2): 50 ug/min via INTRAVENOUS
  Administered 2015-01-30: 35 ug/min via INTRAVENOUS
  Filled 2015-01-28 (×3): qty 250

## 2015-01-28 MED ORDER — CHLORHEXIDINE GLUCONATE 0.12 % MT SOLN
15.0000 mL | Freq: Two times a day (BID) | OROMUCOSAL | Status: DC
  Administered 2015-01-28 – 2015-01-31 (×7): 15 mL via OROMUCOSAL
  Filled 2015-01-28 (×7): qty 15

## 2015-01-28 MED ORDER — POTASSIUM CHLORIDE CRYS ER 20 MEQ PO TBCR: 20.0000 meq | EXTENDED_RELEASE_TABLET | Freq: Every day | ORAL | Status: DC | PRN

## 2015-01-28 MED ORDER — ENOXAPARIN SODIUM 40 MG/0.4ML ~~LOC~~ SOLN
40.0000 mg | SUBCUTANEOUS | Status: DC
Start: 2015-01-28 — End: 2015-01-28
  Filled 2015-01-28: qty 0.4

## 2015-01-28 MED ORDER — MORPHINE SULFATE 2 MG/ML IJ SOLN
2.0000 mg | INTRAMUSCULAR | Status: DC | PRN
  Administered 2015-01-28: 4 mg via INTRAVENOUS
  Administered 2015-01-28 – 2015-01-31 (×13): 2 mg via INTRAVENOUS
  Filled 2015-01-28 (×7): qty 1
  Filled 2015-01-28: qty 2
  Filled 2015-01-28 (×6): qty 1

## 2015-01-28 MED FILL — Sodium Chloride Irrigation Soln 0.9%: Qty: 3000 | Status: AC

## 2015-01-28 MED FILL — Heparin Sodium (Porcine) Inj 1000 Unit/ML: INTRAMUSCULAR | Qty: 30 | Status: AC

## 2015-01-28 MED FILL — Sodium Chloride IV Soln 0.9%: INTRAVENOUS | Qty: 1000 | Status: AC

## 2015-01-28 NOTE — Anesthesia Postprocedure Evaluation (Signed)
Anesthesia Post Note  Patient: [name]  Procedure(s) Performed: Procedure(s) (LRB): Resection and Grafting Ruptured Abdominal Aortic Aneurysm with Aorta-Right Femoral, Left Common Iliac Graft (N/A) LYSIS OF ADHESIONS (N/A) BYPASS GRAFT FEMORAL-FEMORAL ARTERY (N/A)  Anesthesia type: General  Patient location: PACU  Post pain: Pain level controlled  Post assessment: Post-op Vital signs reviewed  Last Vitals: BP 134/64 mmHg  Pulse 84  Temp(Src) 36.7 C (Oral)  Resp 19  Wt 128 lb (58.06 kg)  SpO2 96%  Post vital signs: Reviewed  Level of consciousness: sedated  Complications: No apparent anesthesia complications

## 2015-01-28 NOTE — Evaluation (Signed)
Physical Therapy Evaluation Patient Details Name: [name] MRN: [id number] DOB: 1935/04/25 Today's Date: 01/28/2015   History of Present Illness  Pt is a 80 y/o female who presents with a ruptured AAA. Pt was being worked up for a lung nodule at [facility]. A PET scan/CT scan was done and revealed a contained ruptured abdominal aortic aneurysm. She was sent urgently to [facility] for vascular surgery consultation. On 8/2, pt underwent AAA repair, aorto R femoral left common iliac artery bypass, and right to left femorofemoral bypass.   Clinical Impression  Pt admitted with above diagnosis. Pt currently with functional limitations due to the deficits listed below (see PT Problem List). At the time of PT eval pt was able to perform transfers with +2 assist for safety and equipment. Fear of falling was a limiting factor during mobility assessment. Discussed discharge plans with daughter who is hopeful for home with home health PT to follow. If pt continues to progress with therapy this will be appropriate, however if pt does not progress well with therapy may want to consider a higher level of care. Pt will benefit from skilled PT to increase their independence and safety with mobility to allow discharge to the venue listed below.       Follow Up Recommendations Home health PT;Supervision/Assistance - 24 hour    Equipment Recommendations  None recommended by PT    Recommendations for Other Services OT consult     Precautions / Restrictions Precautions Precautions: Fall Precaution Comments: Hx of many falls at home Restrictions Weight Bearing Restrictions: No      Mobility  Bed Mobility Overal bed mobility: Needs Assistance Bed Mobility: Supine to Sit     Supine to sit: Min assist     General bed mobility comments: Assist to initiate movement off EOB, as well as support during trunk elevation to full sitting. Pt with good anterior lean to shift weight forward and get feet on the floor.    Transfers Overall transfer level: Needs assistance Equipment used: 2 person hand held assist Transfers: Sit to/from [facility] Sit to Stand: Min assist;+2 safety/equipment Stand pivot transfers: Min assist;+2 safety/equipment       General transfer comment: Verbal and tactile cueing for hand placement, as pt was fearful of falling and grabbing/pulling at therapist's clothes. +2 mainly for lines/equipment, however if progressing mobility would have +2 assist for safety.   Ambulation/Gait             General Gait Details: Not able to attempt ambulation this session. Pt very fearful of falling - states the room is "moving" while she is sitting up.  Stairs            Wheelchair Mobility    Modified Rankin (Stroke Patients Only)       Balance Overall balance assessment: Needs assistance Sitting-balance support: Feet supported;No upper extremity supported Sitting balance-Leahy Scale: Poor Sitting balance - Comments: Requires UE support to maintain seated balance   Standing balance support: Bilateral upper extremity supported;During functional activity Standing balance-Leahy Scale: Poor                               Pertinent Vitals/Pain Pain Assessment: No/denies pain (Pt had received morphine prior to session beginning)    Home Living Family/patient expects to be discharged to:: Private residence Living Arrangements: Alone Available Help at Discharge: Family;Available 24 hours/day Type of Home: House Home Access: Stairs to enter Entrance  Stairs-Rails: Psychiatric nurse of Steps: 5 Home Layout: Able to live on main level with bedroom/bathroom Home Equipment: Walker - 2 wheels;Cane - single point;Bedside commode Additional Comments: Pt's daughter present and provides history. Daughter states that she will be available when she is not working, and also has the contact info for resources to provide 24 hour care.      Prior Function Level of Independence: Independent with assistive device(s)         Comments: Per daughter pt is able to perform ADL's independently and occasionally will get up and use the restroom without assist, however daughter prefers to provide supervision for safety as pt has had many falls at home.     Hand Dominance   Dominant Hand: Right    Extremity/Trunk Assessment   Upper Extremity Assessment: Generalized weakness           Lower Extremity Assessment: Generalized weakness      Cervical / Trunk Assessment: Normal  Communication   Communication: No difficulties  Cognition Arousal/Alertness: Lethargic;Suspect due to medications Behavior During Therapy: Anxious (Fearful of falling) Overall Cognitive Status: History of cognitive impairments - at baseline                      General Comments      Exercises        Assessment/Plan    PT Assessment Patient needs continued PT services  PT Diagnosis Difficulty walking;Generalized weakness   PT Problem List Decreased strength;Decreased range of motion;Decreased activity tolerance;Decreased balance;Decreased mobility;Decreased knowledge of use of DME;Decreased knowledge of precautions;Decreased safety awareness;Cardiopulmonary status limiting activity  PT Treatment Interventions DME instruction;Gait training;Stair training;Functional mobility training;Therapeutic activities;Therapeutic exercise;Neuromuscular re-education;Patient/family education   PT Goals (Current goals can be found in the Care Plan section) Acute Rehab PT Goals Patient Stated Goal: None stated PT Goal Formulation: With patient Time For Goal Achievement: 02/04/15 Potential to Achieve Goals: Fair    Frequency Min 3X/week   Barriers to discharge        Co-evaluation               End of Session Equipment Utilized During Treatment: Gait belt;Oxygen Activity Tolerance: Patient limited by fatigue Patient left: in  chair;with call bell/phone within reach;with family/visitor present Nurse Communication: Mobility status         Time: 2956-2130 PT Time Calculation (min) (ACUTE ONLY): 24 min   Charges:   PT Evaluation $Initial PT Evaluation Tier I: 1 Procedure PT Treatments $Therapeutic Activity: 8-22 mins   PT G Codes:        Rolinda Roan 01-30-15, 2:48 PM   Rolinda Roan, PT, DPT Acute Rehabilitation Services Pager: [phone]

## 2015-01-28 NOTE — Progress Notes (Signed)
Patient resting comfortably at this time. Denies pain. Able to state her name. Disoriented to time, place, and situation. Patient extremely drowsy. Follows commands after repeating commands several times. Family not at bedside. Patient is a poor historian.

## 2015-01-28 NOTE — Progress Notes (Signed)
   VASCULAR SURGERY ASSESSMENT & PLAN:  * 1 Day Post-Op s/p: Repair of RAAA. Aorto right fem and right to left fem-fem bypass  * Cardiac: hemodynamically stable. On NTG for BP  * Pulmonary: extubated. IS  * Renal: U/O adequate. Crt ok  * GI/ Nutrition: No bowel function yet. NGT with min drainage. Will D/C  * If remains stable this aM and we can get her off of NTG, then she can be transferred to step down later today.  SUBJECTIVE: Confused, but pain well controlled.  PHYSICAL EXAM: Filed Vitals:   01/28/15 0430 01/28/15 0500 01/28/15 0530 01/28/15 0600  BP:  127/59  128/62  Pulse: 77 76 81 79  Temp:      TempSrc:      Resp: 15 14 15 14   Weight:      SpO2: 96% 96% 96% 96%   Lungs good BS anteriorly Abd: no bowel sounds Feet warm  LABS: Lab Results  Component Value Date   WBC 21.0* 01/28/2015   HGB 12.7 01/28/2015   HCT 39.0 01/28/2015   MCV 89.2 01/28/2015   PLT 353 01/28/2015   Lab Results  Component Value Date   CREATININE 1.37* 01/28/2015   Lab Results  Component Value Date   INR 1.10 01/27/2015   CBG (last 3)   Recent Labs  01/27/15 1229  GLUCAP 93    Active Problems:   AAA (abdominal aortic aneurysm, ruptured)   Gae Gallop Beeper: [phone] 01/28/2015

## 2015-01-28 NOTE — Progress Notes (Signed)
Initial Nutrition Assessment  DOCUMENTATION CODES:   Not applicable  INTERVENTION:   PO diet advancement vs nutrition support initiation  NUTRITION DIAGNOSIS:   Inadequate oral intake related to inability to eat as evidenced by NPO status  GOAL:   Patient will meet greater than or equal to 90% of their needs  MONITOR:   Diet advancement, PO intake, Labs, Weight trends, I & O's  REASON FOR ASSESSMENT:   Malnutrition Screening Tool  ASSESSMENT:   80 y.o. Female who is being worked up for a lung nodule. She had a PET scan performed today and the CT of the abdomen and pelvis showed a contained ruptured abdominal aortic aneurysm. She was sent urgently to [facility] for vascular surgery consultation.  Patient s/p procedure 8/2: OPEN REPAIR OF RUPTURED ABDOMINAL AORTIC ANEURYSM  RD unable to obtain nutrition hx.  Pt confused with mittens on.  NGT in place; plan is to remove.  Pt seen per Clinical Nutrition during most recent admission in July.  Pt with hx of poor PO intake.  Weight has been stable.  Nutrition-Focused physical exam completed 01/08/15.  Findings are no fat depletion, mild to moderate muscle depletion, and no edema.  Diet Order:  Diet NPO time specified  Skin:  Reviewed, no issues  Last BM:  PTA  Height:   Ht Readings from Last 1 Encounters:  01/07/15 5\' 4"  (1.626 m)    Weight:   Wt Readings from Last 1 Encounters:  01/27/15 128 lb (58.06 kg)    Ideal Body Weight:  54.4 kg  BMI:  Body mass index is 21.96 kg/(m^2).  Estimated Nutritional Needs:   Kcal:  1650-1850  Protein:  85-95 gm  Fluid:  per MD  EDUCATION NEEDS:   No education needs identified at this time  Arthur Holms, RD, LDN Pager #: [phone] After-Hours Pager #: [phone]

## 2015-01-28 NOTE — Progress Notes (Signed)
Inpatient Diabetes Program Recommendations  AACE/ADA: New Consensus Statement on Inpatient Glycemic Control (2013)  Target Ranges:  Prepandial:   less than 140 mg/dL      Peak postprandial:   less than 180 mg/dL (1-2 hours)      Critically ill patients:  140 - 180 mg/dL   Results for [name], [name] (MRN [id number]) as of 01/28/2015 14:11  Ref. Range 01/27/2015 00:20 01/27/2015 18:03 01/28/2015 04:10  Glucose Latest Ref Range: 65-99 mg/dL 270 (H) 131 (H) 252 (H)   Note elevated glucose.  Consider checking blood sugars tid with meals and HS.  If greater than 150 mg/dL, may consider adding Novolog correction tid with meals while in the hospital.  Thanks, Adah Perl, RN, BC-ADM Inpatient Diabetes Coordinator Pager [phone] (8a-5p)

## 2015-01-28 NOTE — Progress Notes (Signed)
PT Cancellation Note  Patient Details Name: [name] MRN: [id number] DOB: 01-12-1935   Cancelled Treatment:    Reason Eval/Treat Not Completed: Patient not medically ready. Spoke with [name], RN and pt currently does not have activity orders (although does have PT order) and she is supposed to remove NG tube. She prefers to defer PT at this time until she can clarify plan with MD. Will evaluate pt as schedule permits.   [name] 01/28/2015, 10:01 AM  Pager [phone]

## 2015-01-29 ENCOUNTER — Telehealth: Payer: Self-pay

## 2015-01-29 ENCOUNTER — Encounter (HOSPITAL_COMMUNITY): Payer: Self-pay | Admitting: *Deleted

## 2015-01-29 MED ORDER — METOPROLOL TARTRATE 1 MG/ML IV SOLN
2.5000 mg | Freq: Four times a day (QID) | INTRAVENOUS | Status: DC
  Administered 2015-01-29 – 2015-01-30 (×5): 2.5 mg via INTRAVENOUS
  Filled 2015-01-29 (×9): qty 5

## 2015-01-29 NOTE — Telephone Encounter (Signed)
Spoke with pt's daughter. She would like someone to explain her mom's PET scan. She is medical power attorney. Her mom is still in the hospital. I did not see any notes on the scan. Please advise.

## 2015-01-29 NOTE — Telephone Encounter (Signed)
Thank you for the call with the diagnosis - wants to know when she can get the MRI.  Call ASAP with the results.   Patient still in critical care.    Daughter [name] was in shock and didn't think to ask a lot of questions. Please call Kristi at:   [phone]

## 2015-01-29 NOTE — Evaluation (Signed)
Occupational Therapy Evaluation Patient Details Name: [name] MRN: [id number] DOB: 18-May-1935 Today's Date: 01/29/2015    History of Present Illness Pt is a 80 y/o female who presents with a ruptured AAA. Pt was being worked up for a lung nodule at [facility]. A PET scan/CT scan was done and revealed a contained ruptured abdominal aortic aneurysm. She was sent urgently to [facility] for vascular surgery consultation. On 8/2, pt underwent AAA repair, aorto R femoral left common iliac artery bypass, and right to left femorofemoral bypass.    Clinical Impression   Pt admitted with above. She demonstrates the below listed deficits and will benefit from continued OT to maximize safety and independence with BADLs.  Pt currently requires max - total A for ADLs.  She is limited by pain, cognitive deficits and fear of falling.   She was able to stand with min A.  Anticipate she will progress nicely once pain improves.   Feel she will likely be able to return home with 24 hour assist/supervision and HHOT.  Will follow acutely.       Follow Up Recommendations  Home health OT;Supervision/Assistance - 24 hour    Equipment Recommendations  3 in 1 bedside comode    Recommendations for Other Services       Precautions / Restrictions Precautions Precautions: Fall Precaution Comments: Hx of many falls at home      Mobility Bed Mobility                  Transfers Overall transfer level: Needs assistance Equipment used: 1 person hand held assist Transfers: Sit to/from Stand Sit to Stand: Min assist         General transfer comment: Pt very anxious.  Requires min A for balance and verbal cues for hand placement and safety     Balance Overall balance assessment: Needs assistance Sitting-balance support: Feet supported Sitting balance-Leahy Scale: Poor Sitting balance - Comments: Requires UE support to maintain seated balance   Standing balance support: Bilateral upper extremity  supported Standing balance-Leahy Scale: Poor Standing balance comment: requires bil. UE support and min A                             ADL Overall ADL's : Needs assistance/impaired Eating/Feeding: NPO   Grooming: Wash/dry hands;Wash/dry face;Brushing hair;Moderate assistance;Sitting Grooming Details (indicate cue type and reason): due to attentional and cognitive deficits  Upper Body Bathing: Total assistance;Sitting   Lower Body Bathing: Total assistance;Sit to/from stand   Upper Body Dressing : Total assistance;Sitting   Lower Body Dressing: Total assistance;Sit to/from stand   Toilet Transfer: Minimal assistance;Stand-pivot;BSC   Toileting- Clothing Manipulation and Hygiene: Total assistance;Sit to/from stand       Functional mobility during ADLs: Minimal assistance (sit to stand only ) General ADL Comments: Pt limited by pain, fear of falling, and cognitive impairment      Vision     Perception     Praxis      Pertinent Vitals/Pain Pain Assessment: Faces Faces Pain Scale: Hurts even more Pain Location: Abdomen  Pain Descriptors / Indicators: Grimacing;Guarding Pain Intervention(s): Premedicated before session;Monitored during session;Limited activity within patient's tolerance     Hand Dominance Right   Extremity/Trunk Assessment Upper Extremity Assessment Upper Extremity Assessment: Generalized weakness   Lower Extremity Assessment Lower Extremity Assessment: Defer to PT evaluation       Communication Communication Communication: No difficulties   Cognition Arousal/Alertness: Awake/alert Behavior  During Therapy: Anxious Overall Cognitive Status: No family/caregiver present to determine baseline cognitive functioning                 General Comments: Pt is disoriented to place, time and situation.  She will follow one step commands consistently.  Confabulates at times.  Confused    General Comments       Exercises        Shoulder Instructions      Home Living Family/patient expects to be discharged to:: Private residence Living Arrangements: Alone Available Help at Discharge: Family;Available 24 hours/day Type of Home: House Home Access: Stairs to enter [employer] of Steps: 5 Entrance Stairs-Rails: Right;Left Home Layout: Able to live on main level with bedroom/bathroom         Bathroom Toilet: Standard     Home Equipment: Walker - 2 wheels;Cane - single point;Bedside commode   Additional Comments: Pt's daughter not present during OT eval and pt is unable to provide history       Prior Functioning/Environment Level of Independence: Independent with assistive device(s)        Comments: Per PT eval: Per daughter pt is able to perform ADL's independently and occasionally will get up and use the restroom without assist, however daughter prefers to provide supervision for safety as pt has had many falls at home.    OT Diagnosis: Generalized weakness;Cognitive deficits;Acute pain   OT Problem List: Decreased strength;Decreased activity tolerance;Impaired balance (sitting and/or standing);Decreased safety awareness;Decreased knowledge of use of DME or AE;Pain   OT Treatment/Interventions: Self-care/ADL training;DME and/or AE instruction;Therapeutic activities;Cognitive remediation/compensation;Patient/family education;Balance training    OT Goals(Current goals can be found in the care plan section) Acute Rehab OT Goals OT Goal Formulation: Patient unable to participate in goal setting Time For Goal Achievement: 02/12/15 Potential to Achieve Goals: Good ADL Goals Pt Will Perform Grooming: with min guard assist;standing Pt Will Perform Upper Body Bathing: with supervision;sitting Pt Will Perform Lower Body Bathing: with min guard assist;sit to/from stand Pt Will Perform Upper Body Dressing: with min assist;sitting Pt Will Perform Lower Body Dressing: with min guard assist;sit  to/from stand Pt Will Transfer to Toilet: with min guard assist;ambulating;regular height toilet;grab bars Pt Will Perform Toileting - Clothing Manipulation and hygiene: with min guard assist;sit to/from stand  OT Frequency: Min 2X/week   Barriers to D/C:            Co-evaluation              End of Session Nurse Communication: Mobility status  Activity Tolerance: No increased pain Patient left: in chair;with call bell/phone within reach;with nursing/sitter in room   Time:  -    Charges:    G-Codes:    Lucille Passy M 2015-02-07, 1:02 PM

## 2015-01-29 NOTE — Progress Notes (Addendum)
  AAA Progress Note    01/29/2015 8:11 AM 2 Days Post-Op  Subjective:  No complaints  Tm 99.1 HR 90's-130's NSR/ST (90's-110's this morning) 103'P-594'V systolic 85% 9YT2KM  Gtts:  NTG  Filed Vitals:   01/29/15 0800  BP: 128/104  Pulse: 117  Temp:   Resp: 27    Physical Exam: Cardiac:  tachy Lungs:  Non labored Abdomen:  Soft, NT to palpation; +BS; -flatus per pt; -N/V Incisions:  Covered-bandages are c/d/i Extremities:  Palpable DP pulses bilaterally  CBC    Component Value Date/Time   WBC 21.0* 01/28/2015 0410   RBC 4.37 01/28/2015 0410   HGB 12.7 01/28/2015 0410   HCT 39.0 01/28/2015 0410   PLT 353 01/28/2015 0410   MCV 89.2 01/28/2015 0410   MCH 29.1 01/28/2015 0410   MCHC 32.6 01/28/2015 0410   RDW 16.2* 01/28/2015 0410   LYMPHSABS 1.4 12/15/2014 1704   MONOABS 0.4 12/15/2014 1704   EOSABS 0.1 12/15/2014 1704   BASOSABS 0.1 12/15/2014 1704    BMET    Component Value Date/Time   NA 138 01/28/2015 0410   K 4.3 01/28/2015 0410   CL 113* 01/28/2015 0410   CO2 19* 01/28/2015 0410   GLUCOSE 252* 01/28/2015 0410   BUN 18 01/28/2015 0410   CREATININE 1.37* 01/28/2015 0410   CREATININE 1.12* 12/15/2014 1704   CALCIUM 7.8* 01/28/2015 0410   GFRNONAA 36* 01/28/2015 0410   GFRNONAA 50* 04/02/2014 1116   GFRAA 42* 01/28/2015 0410   GFRAA 58* 04/02/2014 1116    INR    Component Value Date/Time   INR 1.10 01/27/2015 1803     Intake/Output Summary (Last 24 hours) at 01/29/15 0811 Last data filed at 01/29/15 0600  Gross per 24 hour  Intake 2860.3 ml  Output    690 ml  Net 2170.3 ml     Assessment/Plan:  80 y.o. female is s/p  1. Open repair of ruptured abdominal aortic aneurysm 2. Aorto right femoral left common iliac artery bypass (18 x 9 Dacron graft) 3. Right to left femorofemoral bypass with 9 mm Dacron graft 4. Lysis of adhesions 2 Days Post-Op   -pt doing well this morning -NGT with minimal output-remove this morning -continue  npo -tachycardic and has NTG gtt-will order scheduled IV lopressor to help with BP/HR -d/c central line when NTG is off -leukocytosis yesterday-will recheck labs tomorrow.   -OOB to chair -incentive spirometer hourly -creatinine improved yesterday-urine output is marginal - continue to monitor   Leontine Locket, PA-C [facility] [phone] 01/29/2015 8:11 AM  I have examined the patient, reviewed and agree with above. Answers questions appropriately. She is comfortable. Abdomen is soft and nontender. 2+ dorsalis pedis pulses bilaterally.  Curt Jews, MD 01/29/2015 9:58 AM

## 2015-01-30 LAB — CBC
HEMATOCRIT: 29.3 % — AB (ref 36.0–46.0)
HEMOGLOBIN: 9.5 g/dL — AB (ref 12.0–15.0)
MCH: 29.8 pg (ref 26.0–34.0)
MCHC: 32.4 g/dL (ref 30.0–36.0)
MCV: 91.8 fL (ref 78.0–100.0)
PLATELETS: 282 10*3/uL (ref 150–400)
RBC: 3.19 MIL/uL — ABNORMAL LOW (ref 3.87–5.11)
RDW: 16.9 % — ABNORMAL HIGH (ref 11.5–15.5)
WBC: 10.9 10*3/uL — ABNORMAL HIGH (ref 4.0–10.5)

## 2015-01-30 LAB — BASIC METABOLIC PANEL
ANION GAP: 6 (ref 5–15)
BUN: 14 mg/dL (ref 6–20)
CHLORIDE: 115 mmol/L — AB (ref 101–111)
CO2: 20 mmol/L — ABNORMAL LOW (ref 22–32)
CREATININE: 0.82 mg/dL (ref 0.44–1.00)
Calcium: 7.8 mg/dL — ABNORMAL LOW (ref 8.9–10.3)
GFR calc Af Amer: >60 mL/min (ref >60)
GFR calc non Af Amer: >60 mL/min (ref >60)
Glucose, Bld: 106 mg/dL — ABNORMAL HIGH (ref 65–99)
POTASSIUM: 3.7 mmol/L (ref 3.5–5.1)
Sodium: 141 mmol/L (ref 135–145)

## 2015-01-30 LAB — GLUCOSE, CAPILLARY: Glucose-Capillary: 110 mg/dL — ABNORMAL HIGH (ref 65–99)

## 2015-01-30 MED ORDER — METOPROLOL TARTRATE 1 MG/ML IV SOLN
5.0000 mg | Freq: Four times a day (QID) | INTRAVENOUS | Status: DC
  Administered 2015-01-30 – 2015-01-31 (×4): 5 mg via INTRAVENOUS
  Filled 2015-01-30 (×8): qty 5

## 2015-01-30 MED ORDER — FUROSEMIDE 10 MG/ML IJ SOLN
20.0000 mg | Freq: Once | INTRAMUSCULAR | Status: AC
  Administered 2015-01-30: 20 mg via INTRAVENOUS

## 2015-01-30 MED ORDER — KCL IN DEXTROSE-NACL 20-5-0.9 MEQ/L-%-% IV SOLN
INTRAVENOUS | Status: DC
  Administered 2015-01-30 – 2015-01-31 (×3): via INTRAVENOUS
  Filled 2015-01-30 (×3): qty 1000

## 2015-01-30 MED ORDER — HYDRALAZINE HCL 20 MG/ML IJ SOLN
10.0000 mg | Freq: Four times a day (QID) | INTRAMUSCULAR | Status: DC | PRN
  Administered 2015-01-30: 10 mg via INTRAVENOUS
  Filled 2015-01-30: qty 1

## 2015-01-30 MED ORDER — FUROSEMIDE 10 MG/ML IJ SOLN
INTRAMUSCULAR | Status: AC
  Filled 2015-01-30: qty 2

## 2015-01-30 MED ORDER — HYDRALAZINE HCL 20 MG/ML IJ SOLN
INTRAMUSCULAR | Status: AC
  Filled 2015-01-30: qty 1

## 2015-01-30 MED ORDER — BISACODYL 10 MG RE SUPP
10.0000 mg | Freq: Once | RECTAL | Status: AC
  Administered 2015-01-30: 10 mg via RECTAL
  Filled 2015-01-30: qty 1

## 2015-01-30 NOTE — Progress Notes (Addendum)
  AAA Progress Note    01/30/2015 8:04 AM 3 Days Post-Op  Subjective:  C/o pain  Tm 99.6 now 98.9 HR 90's-130's ST 628'Z-662 systolic 94% RA  Filed Vitals:   01/30/15 0715  BP: 155/71  Pulse: 96  Temp:   Resp: 22    Physical Exam: Cardiac:  regular Lungs:  Non labored Abdomen:  Soft, NT/ND; faint BS; -flatus; -BM  Incisions:  Clean and dry Extremities:  Bilateral feet are warm  CBC    Component Value Date/Time   WBC 10.9* 01/30/2015 0530   RBC 3.19* 01/30/2015 0530   HGB 9.5* 01/30/2015 0530   HCT 29.3* 01/30/2015 0530   PLT 282 01/30/2015 0530   MCV 91.8 01/30/2015 0530   MCH 29.8 01/30/2015 0530   MCHC 32.4 01/30/2015 0530   RDW 16.9* 01/30/2015 0530   LYMPHSABS 1.4 12/15/2014 1704   MONOABS 0.4 12/15/2014 1704   EOSABS 0.1 12/15/2014 1704   BASOSABS 0.1 12/15/2014 1704    BMET    Component Value Date/Time   NA 141 01/30/2015 0530   K 3.7 01/30/2015 0530   CL 115* 01/30/2015 0530   CO2 20* 01/30/2015 0530   GLUCOSE 106* 01/30/2015 0530   BUN 14 01/30/2015 0530   CREATININE 0.82 01/30/2015 0530   CREATININE 1.12* 12/15/2014 1704   CALCIUM 7.8* 01/30/2015 0530   GFRNONAA >60 01/30/2015 0530   GFRNONAA 50* 04/02/2014 1116   GFRAA >60 01/30/2015 0530   GFRAA 58* 04/02/2014 1116    INR    Component Value Date/Time   INR 1.10 01/27/2015 1803     Intake/Output Summary (Last 24 hours) at 01/30/15 0804 Last data filed at 01/30/15 0700  Gross per 24 hour  Intake   2146 ml  Output    525 ml  Net   1621 ml     Assessment/Plan:  80 y.o. female is s/p  1. Open repair of ruptured abdominal aortic aneurysm 2. Aorto right femoral left common iliac artery bypass (18 x 9 Dacron graft) 3. Right to left femorofemoral bypass with 9 mm Dacron graft 4. Lysis of adhesions 3 Days Post-Op  -pt with pain this morning-receiving pain medication  -still tachycardic and hypertensive-still requiring NTG.  Will increase lopressor IV to 5mg  IV q6h  -faint BS  present-continue npo.  Will order dulcolax supp this morning -no nausea/vomiting since ngt out -continue to mobilize-PT/OT here to work with pt.  HHOT/HHPT recommended yesterday.  Will consult case management to help with d/c planning -leukocytosis much better today -renal function improved     Leontine Locket, PA-C [facility] [phone] 01/30/2015 8:04 AM  I have examined the patient, reviewed and agree with above.Begin clear liquids  Curt Jews, MD 01/30/2015 2:43 PM

## 2015-01-30 NOTE — Progress Notes (Signed)
Occupational Therapy Treatment Patient Details Name: [name] MRN: [id number] DOB: 09-Nov-1934 Today's Date: 01/30/2015    History of present illness Pt is a 80 y/o female who presents with a ruptured AAA. Pt was being worked up for a lung nodule at [facility]. A PET scan/CT scan was done and revealed a contained ruptured abdominal aortic aneurysm. She was sent urgently to [facility] for vascular surgery consultation. On 8/2, pt underwent AAA repair, aorto R femoral left common iliac artery bypass, and right to left femorofemoral bypass.    OT comments  This 80 yo female admitted with above presents to acute OT making progress today with basic mobility and BADLs in a seated position. She will benefit from continued acute OT acutely and once home with HHOT to get a S level or higher.  Follow Up Recommendations  Home health OT;Supervision/Assistance - 24 hour    Equipment Recommendations  3 in 1 bedside comode       Precautions / Restrictions Precautions Precautions: Fall Precaution Comments: Hx of many falls at home Restrictions Weight Bearing Restrictions: No       Mobility Bed Mobility Overal bed mobility: Needs Assistance Bed Mobility: Rolling;Sidelying to Sit Rolling: Min assist Sidelying to sit: Mod assist Supine to sit: Min assist Sit to supine: Min guard   General bed mobility comments: Pt initiating movement to roll, required assist to complete full roll R and L. Mod A for trunk elevation into full sitting position. Bed pad used to complete scooting to EOB so feet were on floor.   Transfers Overall transfer level: Needs assistance Equipment used: 2 person hand held assist Transfers: Sit to/from Stand Sit to Stand: Min assist;+2 safety/equipment Stand pivot transfers: Min assist;+2 safety/equipment       General transfer comment: Somewhat anxious at first. Assist for safety and balance initially. Once pt appeared secure with support from therapists pt was willing to try  ambulation.     Balance Overall balance assessment: Needs assistance Sitting-balance support: Feet supported;No upper extremity supported Sitting balance-Leahy Scale: Poor Sitting balance - Comments: Requires UE support to maintain seated balance   Standing balance support: Bilateral upper extremity supported Standing balance-Leahy Scale: Poor Standing balance comment: requires Bil UE support and min A                   ADL Overall ADL's : Needs assistance/impaired       Grooming Details (indicate cue type and reason): wash face--setup/S sitting in recliner; combing hair Mod A sitting in recliner                 Toilet Transfer: Minimal assistance;+2 for safety/equipment Toilet Transfer Details (indicate cue type and reason): ambulate 15 feet to sit in recliner behind her                  Vision                 Additional Comments: no change from baseline          Cognition   Behavior During Therapy: [facility] for tasks assessed/performed Overall Cognitive Status: History of cognitive impairments - at baseline                         Exercises Low Level/ICU Exercises Ankle Circles/Pumps: 10 reps Quad Sets: 10 reps           Pertinent Vitals/ Pain       Pain Assessment: Faces  Faces Pain Scale: Hurts little more Pain Location: abdomen with rolling and sidelying to sit Pain Descriptors / Indicators: Grimacing;Guarding Pain Intervention(s): Limited activity within patient's tolerance;Monitored during session;Premedicated before session;Repositioned         Frequency Min 2X/week     Progress Toward Goals  OT Goals(current goals can now be found in the care plan section)  Progress towards OT goals: Progressing toward goals  Acute Rehab OT Goals Patient Stated Goal: None stated  Plan Discharge plan remains appropriate    Co-evaluation    PT/OT/SLP Co-Evaluation/Treatment: Yes Reason for Co-Treatment: Complexity of the  patient's impairments (multi-system involvement);For patient/therapist safety PT goals addressed during session: Mobility/safety with mobility;Balance;Strengthening/ROM OT goals addressed during session: ADL's and self-care         Activity Tolerance Patient tolerated treatment well   Patient Left in chair;with call bell/phone within reach;with nursing/sitter in room   Nurse Communication Mobility status        Time: 6294-7654 OT Time Calculation (min): 27 min  Charges: OT General Charges $OT Visit: 1 Procedure OT Treatments $Self Care/Home Management : 8-22 mins  Almon Register [phone] 01/30/2015, 9:57 AM

## 2015-01-30 NOTE — Progress Notes (Signed)
Physical Therapy Treatment Patient Details Name: [name] MRN: [id number] DOB: [birth year] Today's Date: 01/30/2015    History of Present Illness Pt is a 80 y/o female who presents with a ruptured AAA. Pt was being worked up for a lung nodule at [facility]. A PET scan/CT scan was done and revealed a contained ruptured abdominal aortic aneurysm. She was sent urgently to [facility] for vascular surgery consultation. On 8/2, pt underwent AAA repair, aorto R femoral left common iliac artery bypass, and right to left femorofemoral bypass.     PT Comments    Pt progressing towards physical therapy goals. Was able to perform transfers and ~15' ambulation with +2 assist - mainly for safety. Would like to try RW with pt next session. During mobility, O2 sats remained >93% on RA, and HR increased to 124bpm. She was able to attempt therapeutic exercise, however was very fatigued once back in chair and was dozing off. Will continue to follow with the hope for return home with HHPT to follow.  Follow Up Recommendations  Home health PT;Supervision/Assistance - 24 hour     Equipment Recommendations  None recommended by PT    Recommendations for Other Services       Precautions / Restrictions Precautions Precautions: Fall Precaution Comments: Hx of many falls at home Restrictions Weight Bearing Restrictions: No    Mobility  Bed Mobility Overal bed mobility: Needs Assistance Bed Mobility: Rolling;Sidelying to Sit Rolling: Min assist Sidelying to sit: Mod assist       General bed mobility comments: Pt initiating movement to roll, required assist to complete full roll R and L. Mod A for trunk elevation into full sitting position. Bed pad used to complete scooting to EOB so feet were on floor.   Transfers Overall transfer level: Needs assistance Equipment used: 2 person hand held assist Transfers: Sit to/from Stand Sit to Stand: Min assist;+2 safety/equipment         General transfer  comment: Somewhat anxious at first. Assist for safety and balance initially. Once pt appeared secure with support from therapists pt was willing to try ambulation.   Ambulation/Gait Ambulation/Gait assistance: Min assist;+2 safety/equipment Ambulation Distance (Feet): 15 Feet Assistive device: 2 person hand held assist Gait Pattern/deviations: Step-through pattern;Decreased stride length;Trunk flexed;Narrow base of support Gait velocity: decreased Gait velocity interpretation: Below normal speed for age/gender General Gait Details: Pt able to take slow guarded steps forward with therapist support on both the R and the L for balance/support. NT followed with chair.    Stairs            Wheelchair Mobility    Modified Rankin (Stroke Patients Only)       Balance Overall balance assessment: Needs assistance Sitting-balance support: Feet supported;No upper extremity supported Sitting balance-Leahy Scale: Poor Sitting balance - Comments: Requires UE support to maintain seated balance   Standing balance support: Bilateral upper extremity supported Standing balance-Leahy Scale: Poor Standing balance comment: requires bil. UE support and min A                     Cognition Arousal/Alertness: Awake/alert Behavior During Therapy: WFL for tasks assessed/performed (quiet - appeared sleepy) Overall Cognitive Status: History of cognitive impairments - at baseline                      Exercises Low Level/ICU Exercises Ankle Circles/Pumps: 10 reps Quad Sets: 10 reps    General Comments  Pertinent Vitals/Pain Pain Assessment: Faces Faces Pain Scale: Hurts little more Pain Location: Abdomen during bed mobility to sitting.  Pain Descriptors / Indicators: Grimacing;Guarding Pain Intervention(s): Limited activity within patient's tolerance;Monitored during session;Repositioned;Premedicated before session    Home Living                      Prior  Function            PT Goals (current goals can now be found in the care plan section) Acute Rehab PT Goals Patient Stated Goal: None stated PT Goal Formulation: With patient Time For Goal Achievement: 02/04/15 Potential to Achieve Goals: Fair Progress towards PT goals: Progressing toward goals    Frequency  Min 3X/week    PT Plan Current plan remains appropriate    Co-evaluation PT/OT/SLP Co-Evaluation/Treatment: Yes Reason for Co-Treatment: For patient/therapist safety;Complexity of the patient's impairments (multi-system involvement) PT goals addressed during session: Mobility/safety with mobility;Balance;Strengthening/ROM       End of Session Equipment Utilized During Treatment: Gait belt Activity Tolerance: Patient tolerated treatment well Patient left: in chair;with call bell/phone within reach;with nursing/sitter in room     Time: 5974-7185 PT Time Calculation (min) (ACUTE ONLY): 27 min  Charges:  $Gait Training: 8-22 mins                    G Codes:      Rolinda Roan 02/03/2015, 8:48 AM   Rolinda Roan, PT, DPT Acute Rehabilitation Services Pager: [phone]

## 2015-01-30 NOTE — Care Management Important Message (Signed)
Important Message  Patient Details  Name: [name] MRN: [id number] Date of Birth: 01-20-1935   Medicare Important Message Given:  [facility] notification given    Nathen May 01/30/2015, 11:23 AMImportant Message  Patient Details  Name: [name] MRN: [id number] Date of Birth: [birth year]   Medicare Important Message Given:  Yes-second notification given    Nathen May 01/30/2015, 11:23 AM

## 2015-01-30 NOTE — Care Management Note (Addendum)
Case Management Note  Patient Details  Name: [name] MRN: [id number] Date of Birth: [birth year]  Subjective/Objective:        Talked with daughter, [name], mother confused.  States patient lives at home - was just released from facility last week.  She has been staying with her but has done remarkedly well until her return to hospital.  Hopes she can go back home - looking at possible private duty personal, given list for referral and hopefully maybe some home health.  States has used [facility] before.  Will continue to follow for progression.          Does have a RW at home   Action/Plan:   Expected Discharge Date:                  Expected Discharge Plan:  [place]  In-House Referral:     Discharge planning Services  CM Consult  Post Acute Care Choice:    Choice offered to:     DME Arranged:    DME Agency:     HH Arranged:    HH Agency:     Status of Service:  In process, will continue to follow  Medicare Important Message Given:    Date Medicare IM Given:    Medicare IM give by:    Date Additional Medicare IM Given:    Additional Medicare Important Message give by:     If discussed at [place] of Stay Meetings, dates discussed:    Additional Comments:  Vergie Living, RN 01/30/2015, 9:18 AM

## 2015-01-31 LAB — TYPE AND SCREEN
ABO/RH(D): A NEG
Antibody Screen: NEGATIVE
UNIT DIVISION: 0
UNIT DIVISION: 0
UNIT DIVISION: 0
UNIT DIVISION: 0
Unit division: 0
Unit division: 0
Unit division: 0
Unit division: 0

## 2015-01-31 MED ORDER — METHIMAZOLE 5 MG PO TABS
15.0000 mg | ORAL_TABLET | Freq: Three times a day (TID) | ORAL | Status: DC
  Administered 2015-01-31 – 2015-02-03 (×10): 15 mg via ORAL
  Filled 2015-01-31 (×12): qty 1

## 2015-01-31 MED ORDER — ASPIRIN EC 81 MG PO TBEC
81.0000 mg | DELAYED_RELEASE_TABLET | Freq: Every day | ORAL | Status: DC
  Administered 2015-01-31 – 2015-02-03 (×4): 81 mg via ORAL
  Filled 2015-01-31 (×4): qty 1

## 2015-01-31 MED ORDER — VITAMIN B-12 1000 MCG PO TABS
1000.0000 ug | ORAL_TABLET | Freq: Every day | ORAL | Status: DC
  Administered 2015-01-31 – 2015-02-03 (×3): 1000 ug via ORAL
  Filled 2015-01-31 (×4): qty 1

## 2015-01-31 MED ORDER — RISPERIDONE 0.5 MG PO TABS
0.5000 mg | ORAL_TABLET | Freq: Every day | ORAL | Status: DC
  Administered 2015-01-31 – 2015-02-02 (×3): 0.5 mg via ORAL
  Filled 2015-01-31 (×4): qty 1

## 2015-01-31 MED ORDER — ADULT MULTIVITAMIN W/MINERALS CH
1.0000 | ORAL_TABLET | Freq: Every day | ORAL | Status: DC
  Administered 2015-01-31 – 2015-02-03 (×3): 1 via ORAL
  Filled 2015-01-31 (×4): qty 1

## 2015-01-31 MED ORDER — MORPHINE SULFATE 2 MG/ML IJ SOLN: 2.0000 mg | INTRAMUSCULAR | Status: DC | PRN

## 2015-01-31 MED ORDER — METOPROLOL SUCCINATE ER 50 MG PO TB24
50.0000 mg | ORAL_TABLET | Freq: Every day | ORAL | Status: DC
  Administered 2015-01-31 – 2015-02-03 (×4): 50 mg via ORAL
  Filled 2015-01-31 (×5): qty 1

## 2015-01-31 MED ORDER — PANTOPRAZOLE SODIUM 40 MG PO TBEC
40.0000 mg | DELAYED_RELEASE_TABLET | Freq: Every day | ORAL | Status: DC
  Administered 2015-01-31 – 2015-02-03 (×4): 40 mg via ORAL
  Filled 2015-01-31 (×4): qty 1

## 2015-01-31 MED ORDER — PRAVASTATIN SODIUM 40 MG PO TABS
40.0000 mg | ORAL_TABLET | Freq: Every day | ORAL | Status: DC
  Administered 2015-01-31 – 2015-02-03 (×4): 40 mg via ORAL
  Filled 2015-01-31 (×4): qty 1

## 2015-01-31 MED ORDER — FUROSEMIDE 10 MG/ML IJ SOLN
20.0000 mg | Freq: Once | INTRAMUSCULAR | Status: AC
  Administered 2015-01-31: 20 mg via INTRAVENOUS
  Filled 2015-01-31: qty 2

## 2015-01-31 MED ORDER — ASPIRIN 81 MG PO TABS: 81.0000 mg | ORAL_TABLET | Freq: Every day | ORAL | Status: DC

## 2015-01-31 NOTE — Progress Notes (Signed)
Report given to [facility] RN at this time.  Pt has no s/s of any acute distress or c/o pain.

## 2015-01-31 NOTE — Progress Notes (Addendum)
  AAA Progress Note    01/31/2015 7:35 AM 4 Days Post-Op  Subjective:  When asked if her belly hurts she replies "not enough to complain about"  Tm 99 now afebrile HR 90's-120's NSR/ST 443'X-540'G systolic 86% RA  Gtts:  NTG off yesterday afternoon  Filed Vitals:   01/31/15 0700  BP: 172/70  Pulse: 94  Temp:   Resp: 18    Physical Exam: Cardiac:  regular Lungs:  CTAB Abdomen:  Soft, +BS; +BM yesterday Incisions:  Midline and bilateral groin incisions healing nicely Extremities:  Palpable DP pulses bilaterally Neuro:  Pleasantly confused; not oriented to year or place  CBC    Component Value Date/Time   WBC 10.9* 01/30/2015 0530   RBC 3.19* 01/30/2015 0530   HGB 9.5* 01/30/2015 0530   HCT 29.3* 01/30/2015 0530   PLT 282 01/30/2015 0530   MCV 91.8 01/30/2015 0530   MCH 29.8 01/30/2015 0530   MCHC 32.4 01/30/2015 0530   RDW 16.9* 01/30/2015 0530   LYMPHSABS 1.4 12/15/2014 1704   MONOABS 0.4 12/15/2014 1704   EOSABS 0.1 12/15/2014 1704   BASOSABS 0.1 12/15/2014 1704    BMET    Component Value Date/Time   NA 141 01/30/2015 0530   K 3.7 01/30/2015 0530   CL 115* 01/30/2015 0530   CO2 20* 01/30/2015 0530   GLUCOSE 106* 01/30/2015 0530   BUN 14 01/30/2015 0530   CREATININE 0.82 01/30/2015 0530   CREATININE 1.12* 12/15/2014 1704   CALCIUM 7.8* 01/30/2015 0530   GFRNONAA >60 01/30/2015 0530   GFRNONAA 50* 04/02/2014 1116   GFRAA >60 01/30/2015 0530   GFRAA 58* 04/02/2014 1116    INR    Component Value Date/Time   INR 1.10 01/27/2015 1803     Intake/Output Summary (Last 24 hours) at 01/31/15 0735 Last data filed at 01/31/15 0700  Gross per 24 hour  Intake 2730.19 ml  Output   1635 ml  Net 1095.19 ml     Assessment/Plan:  80 y.o. female is s/p  1. Open repair of ruptured abdominal aortic aneurysm 2. Aorto right femoral left common iliac artery bypass (18 x 9 Dacron graft) 3. Right to left femorofemoral bypass with 9 mm Dacron graft 4. Lysis  of adhesions 4 Days Post-Op  -pt tolerated clear liquids and had BM yesterday.  Will advance diet to full liquids this am and if she tolerates, she can have regular diet for lunch. Restart her home meds, which include Toprol, Tapazole, and Risperidone  -she had a good response to Lasix yesterday-will give 20mg  IV this morning x 1 dose as UOP dropped off this am -d/c foley at noon (since she is getting IV lasix this am) -decrease IVF to 50cc/hr this am-if diet advanced to regular diet and tolerates lunch-can heplock IV -pleasantly confused-still requiring a sitter -transfer to [place] -d/c central line -OT/PT recommending home health OT/PT.  Case management has seen pt -continue mobilization   Leontine Locket, [place] [facility] [phone] 01/31/2015 7:35 AM  I have examined the patient, reviewed and agree with above.Looks great.  Comfortable.  Still confused. Denies abdominal pain. Has had bowel movement. Advance diet transfer to step down  Curt Jews, MD 01/31/2015 8:32 AM

## 2015-01-31 NOTE — Progress Notes (Signed)
01/31/2015 1500 Received to room 2w11 a transfer from 2S.  Pt is alert & Ox2, knows self and where she is, not oriented to time and situation.  Sitter in room with pt. Tele monitor applied and CCMD notified. Oriented pt and sitter to room, call light and bed.  Call bell in reach. [place]

## 2015-02-01 LAB — BASIC METABOLIC PANEL
Anion gap: 6 (ref 5–15)
BUN: 9 mg/dL (ref 6–20)
CO2: 20 mmol/L — AB (ref 22–32)
CREATININE: 0.7 mg/dL (ref 0.44–1.00)
Calcium: 8.6 mg/dL — ABNORMAL LOW (ref 8.9–10.3)
Chloride: 115 mmol/L — ABNORMAL HIGH (ref 101–111)
GFR calc Af Amer: >60 mL/min (ref >60)
GLUCOSE: 103 mg/dL — AB (ref 65–99)
Potassium: 3.2 mmol/L — ABNORMAL LOW (ref 3.5–5.1)
Sodium: 141 mmol/L (ref 135–145)

## 2015-02-01 LAB — CBC
HCT: 32.7 % — ABNORMAL LOW (ref 36.0–46.0)
Hemoglobin: 10.1 g/dL — ABNORMAL LOW (ref 12.0–15.0)
MCH: 28.3 pg (ref 26.0–34.0)
MCHC: 30.9 g/dL (ref 30.0–36.0)
MCV: 91.6 fL (ref 78.0–100.0)
PLATELETS: 280 10*3/uL (ref 150–400)
RBC: 3.57 MIL/uL — ABNORMAL LOW (ref 3.87–5.11)
RDW: 16.7 % — AB (ref 11.5–15.5)
WBC: 7.3 10*3/uL (ref 4.0–10.5)

## 2015-02-01 MED ORDER — ACETAMINOPHEN 325 MG PO TABS
650.0000 mg | ORAL_TABLET | ORAL | Status: DC | PRN
  Administered 2015-02-01 – 2015-02-02 (×2): 650 mg via ORAL
  Filled 2015-02-01 (×2): qty 2

## 2015-02-01 NOTE — Progress Notes (Signed)
Subjective: Interval History: none.. 8 her breakfast without difficulty. The confusion is resolving. Sitter is in the room with her. Denies abdominal pain  Objective: Vital signs in last 24 hours: Temp:  [97.5 F (36.4 C)-98.5 F (36.9 C)] 98.5 F (36.9 C) (08/07 0511) Pulse Rate:  [85-101] 85 (08/07 0511) Resp:  [17-24] 18 (08/07 0511) BP: (98-177)/(59-99) 167/73 mmHg (08/07 0511) SpO2:  [97 %-100 %] 98 % (08/07 0511)  Intake/Output from previous day: 08/06 0701 - 08/07 0700 In: 1033.3 [P.O.:860; I.V.:173.3] Out: 1055 [Urine:1055] Intake/Output this shift:    Abdomen soft nontender. Incision healing. Both groin wounds healing an easily palpable femorofemoral bypass  Lab Results:  Recent Labs  01/30/15 0530 02/01/15 0304  WBC 10.9* 7.3  HGB 9.5* 10.1*  HCT 29.3* 32.7*  PLT 282 280   BMET  Recent Labs  01/30/15 0530 02/01/15 0304  NA 141 141  K 3.7 3.2*  CL 115* 115*  CO2 20* 20*  GLUCOSE 106* 103*  BUN 14 9  CREATININE 0.82 0.70  CALCIUM 7.8* 8.6*    Studies/Results: Dg Chest 2 View  01/07/2015   CLINICAL DATA:  Altered awareness.  Lethargy.  EXAM: CHEST  2 VIEW  COMPARISON:  12/28/2014  FINDINGS: Shunt tubing projects over the right chest without visible discontinuity.  Atherosclerotic aortic arch with mild aortic tortuosity. Heart size currently normal.  Chronic deformity left proximal humerus from old healed fracture.  Dextroconvex upper lumbar scoliosis.  Suspected emphysema.  Mild mid thoracic wedging, similar to prior.  1.0 by 0.9 cm density peripherally in the left mid lung, admittedly projecting over rib and scapula.  Linear subsegmental atelectasis or scarring along the left hemidiaphragm.  IMPRESSION: 1. 1 cm peripheral left mid lung opacity may be a new pulmonary nodule. The patient also had a previous pulmonary nodule in the right upper lobe on a CT scan from may 2015 which warrants follow up at this time in order to assess for any change. Accordingly,  I do recommend chest CT both to evaluate this new finding and to reassess the old finding. 2. Atherosclerotic aortic arch. 3. Subsegmental atelectasis or scar peripherally at the left lung base. 4. Chronic deformity of the left proximal humerus from old healed fracture.   Electronically Signed   By: Van Clines M.D.   On: 01/07/2015 13:29   Ct Head Wo Contrast  01/07/2015   CLINICAL DATA:  Altered mental status. Hydrocephalus with VP shunt catheter. Intraventricular mass.  EXAM: CT HEAD WITHOUT CONTRAST  TECHNIQUE: Contiguous axial images were obtained from the base of the skull through the vertex without intravenous contrast.  COMPARISON:  CT on 12/28/2014 and MRI on 12/17/2014  FINDINGS: Right frontal ventriculostomy remains in place with tip in the left frontal horn. Hydrocephalus is stable since previous study.  Small calcified mass in the inferior aspect of the fourth ventricle is stable.  Mild cerebral atrophy and moderate to severe chronic small vessel disease is stable in appearance. Old bilateral thalamic lacunar infarcts are unchanged.  There is no evidence of intracranial hemorrhage, brain edema, or other signs of acute infarction. There is no evidence of mass effect. No abnormal extraaxial fluid collections are identified. No skull abnormality identified.  IMPRESSION: Stable small calcified mass in the inferior aspect of the fourth ventricle. Stable hydrocephalus with ventriculostomy.  No new intracranial abnormality identified.  Stable cerebral atrophy, chronic small vessel disease, and old bilateral thalamic lacunar infarcts.   Electronically Signed   By: Sharrie Rothman.D.  On: 01/07/2015 18:57   Ct Chest Wo Contrast  01/08/2015   CLINICAL DATA:  History of pneumonia. Clinical question of resolution.  Acute Kidney Injury, recent hypercalcemia with normal PTH level. Prior CT in 2015 showed SPN in RUL. CXR this admission shows possible new nodule left mid lung field. Patient has declined  work-up of nodules with PET or biopsy.  EXAM: CT CHEST WITHOUT CONTRAST  TECHNIQUE: Multidetector CT imaging of the chest was performed following the standard protocol without IV contrast.  COMPARISON:  Chest radiograph, 01/07/2015.  Chest CT, 11/21/2013.  FINDINGS: Thoracic inlet:  No mass or adenopathy.  Normal thyroid.  Mediastinum and hila: Heart normal in size. There are dense coronary artery calcifications. Great vessels are normal in caliber. Calcified plaque is noted along the aortic arch and descending thoracic aorta. Mildly enlarged subcarinal lymph node measuring 11 mm in short axis. No other adenopathy. No mediastinal masses. No hilar masses or adenopathy.  Lungs and pleura: There is an area of linear/ discoid opacity in the inferior, lateral left upper lobe that accounts for the chest radiographic opacity seen on the current study. This is most likely atelectasis. There is no discrete lung nodule or mass in this location. However, there are small bilateral posterior upper lobe nodules. The largest lies in the posterior right upper lobe, centered on image 20, series 3, measuring 12 mm, stable from the prior CT. This has a tubular appearance. Other small adjacent nodules are noted, more numerous on the prior exam. Nodules in the posterior left upper lobe are also more numerous than on the prior exam. Largest nodule on the left measures 6.5 mm, image 18, series 3. There is dependent opacity in both lower lobes most consistent with atelectasis. There is also minimal left pleural effusion. There is mild reticular opacity dependently in the upper lobes likely subsegmental atelectasis. There is no convincing pneumonia or pulmonary edema. No pneumothorax.  Limited upper abdomen: 1 cm low-density lesion in the anterior lateral segment of the left liver lobe consistent with a cyst, stable. Status post cholecystectomy. There is significant stranding in the visible perinephric space on the left. This was not  present previously. The etiology of this is unclear.  Musculoskeletal: Mild compression fracture of T7 stable from the prior study. No new fractures. No osteoblastic or osteolytic lesions.  IMPRESSION: 1. Multiple small posterior upper lobe pulmonary nodules, increased in number from the prior exam. Largest nodule in the posterior right upper lobe is stable in size from prior study. These are most likely inflammatory in origin. The dominant right upper lobe nodule has been stable for 1 year. Given that the number of nodules have crease, follow-up chest CT to reassess in 6 months is recommended. 2. No lung consolidation is seen to suggest pneumonia. Dependent lung opacity is noted mostly in the lower lobes consistent with atelectasis. There is a minimal left pleural effusion but no evidence of pulmonary edema. 3. Small nodular opacity noted in the left peripheral mid lung on the recent prior chest radiograph is due to a linear area of presumed atelectasis the left upper lobe. There is no nodule in this location. 4. Dense coronary artery calcifications. Mildly enlarged subcarinal lymph node without substantial change from the prior study and therefore presumed reactive. 5. There is inflammatory type stranding in the visible superior aspect of the left perinephric space. The etiology of this is unclear, and it was not present on the prior chest CT. It may reflect hemorrhage. Consider followup abdomen and  pelvis CT for further assessment.   Electronically Signed   By: Lajean Manes M.D.   On: 01/08/2015 18:00   Nm Pet Image Initial (pi) Skull Base To Thigh  01/27/2015   CLINICAL DATA:  Initial Treatment strategy for multiple pulmonary nodules.  EXAM: NUCLEAR MEDICINE PET SKULL BASE TO THIGH  TECHNIQUE: 6.4 mCi F-18 FDG was injected intravenously. Full-ring PET imaging was performed from the skull base to thigh after the radiotracer. CT data was obtained and used for attenuation correction and anatomic localization.   FASTING BLOOD GLUCOSE:  Value: 93 mg/dl  COMPARISON:  Chest CT 01/08/2015.  FINDINGS: NECK  No hypermetabolic lymph nodes in the neck. Ventriculoperitoneal shunt tubing in the right cervical region extending caudally.  CHEST  Previously described pulmonary nodules in the posterior aspects of the lungs bilaterally have nearly completely resolved, with exception of a 5 mm right upper lobe nodule (image 19 of series 8) which has decreased from 12 mm on the prior examination. This nodule does not demonstrate hypermetabolism. Mild subsegmental atelectasis in the posterior aspects of the upper lobes of the lungs bilaterally. No acute consolidative airspace disease. No pleural effusions. In the anterior mediastinum (image 72 of series 4) there is an 11 mm short axis soft tissue attenuation lesion which likely represents an enlarged lymph node, which demonstrates no hypermetabolism (SUVmax = 1.3), and is similar to the prior examination, favored to be benign. Heart size is normal. There is no significant pericardial fluid, thickening or pericardial calcification. There is atherosclerosis of the thoracic aorta, the great vessels of the mediastinum and the coronary arteries, including calcified atherosclerotic plaque in the left main, left anterior descending, left circumflex and right coronary arteries. Ventriculoperitoneal shunt tubing in the anterior subcutaneous soft tissues of the thorax.  ABDOMEN/PELVIS  Extensive atherosclerosis, including a large fusiform infrarenal abdominal aortic aneurysm which measures at least 5.7 x 4.9 cm (image 123 of series 4). Notably, there is disruption of intimal calcifications from the 3 o'clock to the 6 o'clock position with extension of intermediate attenuation material into the left retroperitoneum. This is presumably a contained rupture of the infrarenal abdominal aorta, which likely occurred within the last 1-2 weeks. This extends cephalad in the retroperitoneum to the level of the  interpolar region of the left kidney, and extends caudally in the retroperitoneum along the left pelvic sidewall. There is a small amount of low-attenuation free fluid lying dependently within the cul-de-sac, which is likely secondary to the patient's indwelling VP shunt, the tip of which is in the central pelvis. No definite high attenuation fluid is identified within the retroperitoneum or within the peritoneal cavity to suggest more recent acute hemorrhage at this time.  No abnormal hypermetabolic activity within the liver, pancreas, right adrenal gland, or spleen. 9 mm low attenuation lesion in segment 2 of the liver is not characterized, but demonstrates no hypermetabolism, likely a small cyst. Nodular low-attenuation (5 HU) thickening of the left adrenal gland measuring up to 2.9 x 1.5 cm which demonstrates some hypermetabolism (SUVmax = 3.0); this is stable in size in appearance to prior study 11/21/2013, favored to be benign, potentially a lipid poor adenoma. The patient's left kidney is anteriorly displaced by the large left retroperitoneal hemorrhage. Calcifications are present within the region of the renal collecting systems bilaterally, which may represent vascular calcifications or nonobstructive calculi. The most suspicious of these is a 3 mm calcification in the interpolar region of the left kidney, which is strongly favored to represent a calculus.  Notably, there is moderate left hydroureteronephrosis involving the proximal aspect of the left ureter, terminating at a level where there is likely extrinsic mass effect upon the proximal third of the left ureter secondary to the adjacent ruptured aneurysm and retroperitoneal hemorrhage. The distal 2/3 of the left ureter are normal in caliber. No hypermetabolic lymph nodes in the abdomen or pelvis.  SKELETON  Multiple healing antrerolateral rib fractures are noted bilaterally, all of which are mildly hypermetabolic (physiologic metabolic activity). No  other focal hypermetabolic activity to suggest skeletal metastasis. Postoperative changes of ORIF for noted in the left femoral neck.  IMPRESSION: 1. Ruptured abdominal aortic aneurysm with large left retroperitoneal hemorrhage, as above. Given the relatively low attenuation of the retroperitoneal fluid, this aneurysm likely a ruptured within the last 1-2 weeks. No definite findings of more recent acute hemorrhage identified at this time. However, this patient needs immediate Vascular Surgery evaluation. 2. Near complete resolution of the previously noted pulmonary nodules in the dependent portions of the lungs bilaterally, indicative of a benign infectious/inflammatory process on the prior examination. 3. Multiple healing rib fractures bilaterally, potentially related to recent resuscitative efforts. 4. Additional incidental findings, as above. These results were called by telephone at the time of interpretation on 01/27/2015 at 3:59 pm to [place] for [name], who verbally acknowledged these results.   Electronically Signed   By: Vinnie Langton M.D.   On: 01/27/2015 16:29   Dg Chest Port 1 View  01/28/2015   CLINICAL DATA:  Ruptured abdominal aortic aneurysm  EXAM: PORTABLE CHEST - 1 VIEW  COMPARISON:  01/27/2015; PET-CT -01/27/2015  FINDINGS: Grossly unchanged cardiac silhouette mediastinal contours. Atherosclerotic plaque within the thoracic aorta. Stable position of support apparatus. No pneumothorax. Slight worsening of left basilar / retrocardiac opacities favored to represent atelectasis. No definite pleural effusion. No evidence of edema. Surgical clips overlie the right upper abdominal quadrant. Grossly unchanged bones.  IMPRESSION: 1.  Stable positioning of support apparatus.  No pneumothorax. 2. Worsening left basilar atelectasis.   Electronically Signed   By: Sandi Mariscal M.D.   On: 01/28/2015 07:33   Dg Chest Port 1 View  01/28/2015   CLINICAL DATA:  Assess support equipment  EXAM:  PORTABLE CHEST - 1 VIEW  COMPARISON:  01/07/2015  FINDINGS: There is a right jugular sheath with tip in the SVC. There is a nasogastric tube extending into the stomach. There is no pneumothorax. There are mild linear opacities in the periphery of both lungs. There is no confluent airspace opacity. There is no large effusion.  IMPRESSION: Support equipment appears satisfactorily positioned.  Scattered linear opacities, possibly atelectatic. No confluent consolidation or large effusion.   Electronically Signed   By: Andreas Newport M.D.   On: 01/28/2015 00:17   Dg Abd Portable 1v  01/28/2015   CLINICAL DATA:  Assess support equipment  EXAM: PORTABLE ABDOMEN - 1 VIEW  COMPARISON:  None.  FINDINGS: The nasogastric tube reaches the stomach. The side port is in the region of the EG junction. Ventriculoperitoneal shunt tubing projects over the abdomen. Abdominal gas pattern appears unremarkable.  IMPRESSION: Nasogastric tube reaches the stomach. The side port is in the region of the EG junction.   Electronically Signed   By: Andreas Newport M.D.   On: 01/28/2015 00:18   Anti-infectives: Anti-infectives    Start     Dose/Rate Route Frequency Ordered Stop   01/28/15 0800  cefUROXime (ZINACEF) 1.5 g in dextrose 5 % 50 mL IVPB  1.5 g 100 mL/hr over 30 Minutes Intravenous Every 12 hours 01/28/15 0042 01/28/15 2104   01/27/15 2230  cefUROXime (ZINACEF) 1.5 g in dextrose 5 % 50 mL IVPB  Status:  Discontinued     1.5 g 100 mL/hr over 30 Minutes Intravenous To Surgery 01/27/15 2222 01/28/15 0042      Assessment/Plan: s/p Procedure(s): Resection and Grafting Ruptured Abdominal Aortic Aneurysm with Aorta-Right Femoral, Left Common Iliac Graft (N/A) LYSIS OF ADHESIONS (N/A) BYPASS GRAFT FEMORAL-FEMORAL ARTERY (N/A) Continued progress from repair of ruptured aneurysm. Continue to mobilize.   LOS: 5 days   Curt Jews 02/01/2015, 9:52 AM

## 2015-02-02 ENCOUNTER — Telehealth: Payer: Self-pay | Admitting: Vascular Surgery

## 2015-02-02 LAB — GLUCOSE, CAPILLARY: Glucose-Capillary: 92 mg/dL (ref 65–99)

## 2015-02-02 LAB — TROPONIN I

## 2015-02-02 LAB — CBC
HCT: 35.5 % — ABNORMAL LOW (ref 36.0–46.0)
HEMOGLOBIN: 11.3 g/dL — AB (ref 12.0–15.0)
MCH: 29 pg (ref 26.0–34.0)
MCHC: 31.8 g/dL (ref 30.0–36.0)
MCV: 91.3 fL (ref 78.0–100.0)
Platelets: 289 10*3/uL (ref 150–400)
RBC: 3.89 MIL/uL (ref 3.87–5.11)
RDW: 16.1 % — ABNORMAL HIGH (ref 11.5–15.5)
WBC: 8.3 10*3/uL (ref 4.0–10.5)

## 2015-02-02 LAB — BASIC METABOLIC PANEL
ANION GAP: 9 (ref 5–15)
BUN: <5 mg/dL — ABNORMAL LOW (ref 6–20)
CO2: 23 mmol/L (ref 22–32)
Calcium: 8.9 mg/dL (ref 8.9–10.3)
Chloride: 108 mmol/L (ref 101–111)
Creatinine, Ser: 0.64 mg/dL (ref 0.44–1.00)
GFR calc Af Amer: >60 mL/min (ref >60)
GFR calc non Af Amer: >60 mL/min (ref >60)
Glucose, Bld: 91 mg/dL (ref 65–99)
Potassium: 2.8 mmol/L — ABNORMAL LOW (ref 3.5–5.1)
SODIUM: 140 mmol/L (ref 135–145)

## 2015-02-02 MED ORDER — POTASSIUM CHLORIDE 20 MEQ PO PACK: 40.0000 meq | PACK | Freq: Once | ORAL | Status: DC

## 2015-02-02 MED ORDER — ENSURE ENLIVE PO LIQD
237.0000 mL | Freq: Two times a day (BID) | ORAL | Status: DC
  Administered 2015-02-02: 237 mL via ORAL

## 2015-02-02 MED ORDER — POTASSIUM CHLORIDE CRYS ER 20 MEQ PO TBCR
40.0000 meq | EXTENDED_RELEASE_TABLET | Freq: Once | ORAL | Status: AC
  Administered 2015-02-02: 40 meq via ORAL
  Filled 2015-02-02: qty 2

## 2015-02-02 NOTE — Progress Notes (Signed)
   VASCULAR SURGERY ASSESSMENT & PLAN:  * 6 Days Post-Op s/p: Open repair of ruptured abdominal aortic aneurysm with aorto right femoral left common iliac graft and right to left femorofemoral bypass graft. In addition, she required lysis of adhesions.  * Her mental status change this morning I believe is related to her underlying dementia, age, and recent surgery. I have spoken to her daughter this morning and she did have some underlying dementia which also noted preoperatively. I think that the safest thing for her is to try to get her home into a familiar environment as soon as possible. She will likely need home physical therapy. Her daughter is making arrangements to stay with her initially. Case management has seen the patient and have recommended home health OT/PT.  * GI/nutrition: The patient appears to be tolerating her diet and according to the nursing staff she is moving her bowels.  SUBJECTIVE: The patient denies nausea, vomiting.  PHYSICAL EXAM: Filed Vitals:   02/01/15 2115 02/01/15 2124 02/01/15 2317 02/02/15 0400  BP: 174/95 184/90 154/88 154/98  Pulse: 110  103 101  Temp: 98.3 F (36.8 C)   97.3 F (36.3 C)  TempSrc:    Oral  Resp: 18   20  Height:      Weight:      SpO2: 100%   98%   Lungs: Clear  Abdomen: Soft with normal pitched bowel sounds Both feet are warm and well-perfused. There is no significant lower extremity swelling. She did not want me to examine her incisions, and given her confused state I did not persist.  LABS: Lab Results  Component Value Date   WBC 7.3 02/01/2015   HGB 10.1* 02/01/2015   HCT 32.7* 02/01/2015   MCV 91.6 02/01/2015   PLT 280 02/01/2015   Lab Results  Component Value Date   CREATININE 0.70 02/01/2015   Lab Results  Component Value Date   INR 1.10 01/27/2015   CBG (last 3)   Recent Labs  01/30/15 1621 02/02/15 0727  GLUCAP 110* 92    Active Problems:   AAA (abdominal aortic aneurysm, ruptured)   Gae Gallop Beeper: [phone] 02/02/2015

## 2015-02-02 NOTE — Progress Notes (Signed)
Nutrition Follow Up  DOCUMENTATION CODES:   Not applicable  INTERVENTION:   Ensure Enlive po BID, each supplement provides 350 kcal and 20 grams of protein  NEW NUTRITION DIAGNOSIS:   Increased nutrient needs related to  (post op healing) as evidenced by estimated needs, ongoing  GOAL:   Patient will meet greater than or equal to 90% of their needs, progressing  MONITOR:   PO intake, Supplement acceptance, Labs, Weight trends, I & O's  ASSESSMENT:   79 y.o. Female who is being worked up for a lung nodule. She had a PET scan performed today and the CT of the abdomen and pelvis showed a contained ruptured abdominal aortic aneurysm. She was sent urgently to [facility] for vascular surgery consultation.  Patient s/p procedure 8/2: OPEN REPAIR OF RUPTURED ABDOMINAL AORTIC ANEURYSM  Pt transferred from SICU to 2W-Cardiac 8/6.  Advanced to Clears 8/5 then Full Liquids.  Currently on a Regular diet with variable PO intake of 25-75%.  Would benefit from oral nutrition supplements for healing & recovery.  RD to order.  Diet Order:  Diet regular Room service appropriate?: Yes; Fluid consistency:: Thin  Skin:  Reviewed, no issues  Last BM:  8/7  Height:   Ht Readings from Last 1 Encounters:  01/30/15 5\' 1"  (1.549 m)    Weight:   Wt Readings from Last 1 Encounters:  01/27/15 128 lb (58.06 kg)    Ideal Body Weight:  54.4 kg  BMI:  Body mass index is 24.2 kg/(m^2).  Estimated Nutritional Needs:   Kcal:  1650-1850  Protein:  85-95 gm  Fluid:  per MD  EDUCATION NEEDS:   No education needs identified at this time  Arthur Holms, RD, LDN Pager #: [phone] After-Hours Pager #: [phone]

## 2015-02-02 NOTE — Care Management Note (Addendum)
Case Management Note  Patient Details  Name: JAYLENA HOLLOWAY MRN: [id number] Date of Birth: May 17, 1935  Subjective/Objective:      Pt 6 days s/p AAA              Action/Plan:  Per pts daughter (pt has dementia) pt is independent from home.  Pts daughter is currently staying at pts home and has made arrangements to stay with pt post discharge.   Pt has recently been in SNF for rehab but discharged home prior to this admit.   Expected Discharge Date:                  Expected Discharge Plan:  [place]  In-House Referral:     Discharge planning Services  CM Consult  Post Acute Care Choice:    Choice offered to:  Pt deferred to daughter [place]  DME Arranged:  3:1 DME Agency:  [facility] Arranged:  PT,OT [place] Agency:  [facility]  Status of Service:  In process, will continue to follow  Medicare Important Message Given:  Yes-second notification given Date Medicare IM Given:    Medicare IM give by:    Date Additional Medicare IM Given:    Additional Medicare Important Message give by:     If discussed at [place] of Stay Meetings, dates discussed:    Additional Comments: CM offered pt choice, pt asked CM to contact daughter [name]. Kristi chose [facility] for [facility] and DME, agency contacted, referral accepted.   Pts daughter will provide 24 hour supervision post discharge. Maryclare Labrador, RN 02/02/2015, 2:11 PM

## 2015-02-02 NOTE — Progress Notes (Addendum)
MD notified of K of 2.8 and two loose stools.   Silva Bandy ordered 40 mEq of potassium now.   Ikeisha Blumberg, Mervin Kung RN

## 2015-02-02 NOTE — Progress Notes (Signed)
Spoke with patient daughter [name] not uncommon for patient to have loose stools. Will continue to monitor.   Naasia Weilbacher, Mervin Kung RN

## 2015-02-02 NOTE — Progress Notes (Signed)
Per verbal order, Dr Scot Dock, cancel stat ABG and monitor pt's pulse ox. MD does not want to irritate pt by obtaining ABG/ blood stick at that time.

## 2015-02-02 NOTE — Progress Notes (Signed)
PT Cancellation Note  Patient Details Name: [name] MRN: [id number] DOB: 1935-01-28   Cancelled Treatment:    Reason Eval/Treat Not Completed: Patient declined, no reason specified. Pt appears confused and withdrawn when PT entered. Pt refuses mobility and was gently encouraged to participate. Pt staring at the ceiling for most of interaction with occasional, brief instances of pt looking directly at therapist. Will continue to follow and check back as schedule allows.    Rolinda Roan 02/02/2015, 11:19 AM   Rolinda Roan, PT, DPT Acute Rehabilitation Services Pager: [phone]

## 2015-02-02 NOTE — Care Management Important Message (Signed)
Important Message  Patient Details  Name: [name] MRN: [id number] Date of Birth: [birth year]   Medicare Important Message Given:  Yes-third notification given    Pricilla Handler 02/02/2015, 2:36 PM

## 2015-02-02 NOTE — Telephone Encounter (Signed)
-----   Message from Mena Goes, RN sent at 02/01/2015  7:41 PM EDT ----- Regarding: Schedule   ----- Message -----    From: Gabriel Earing, PA-C    Sent: 01/31/2015   8:03 AM      To: [facility]vs Charge Pool  S/p repair rupture AAA.  F/u with Dr. Scot Dock in 2 weeks.  Thanks

## 2015-02-02 NOTE — Telephone Encounter (Signed)
No answer, no vm. Mailed letter,dpm

## 2015-02-03 MED ORDER — HALOPERIDOL LACTATE 5 MG/ML IJ SOLN
1.0000 mg | Freq: Once | INTRAMUSCULAR | Status: DC
  Filled 2015-02-03: qty 0.2

## 2015-02-03 MED ORDER — ACETAMINOPHEN 325 MG PO TABS: 325.0000 mg | ORAL_TABLET | ORAL | Status: AC | PRN

## 2015-02-03 NOTE — Progress Notes (Signed)
Pt. Daughter has come to sit with her.Jarmarcus Wambold 5:20 AM

## 2015-02-03 NOTE — Discharge Summary (Signed)
AAA Discharge Summary    [name] [birth year] 80 y.o. female  [id number]  Admission Date: 01/27/2015  Discharge Date: 02/03/15  Physician: Angelia Mould, MD  Admission Diagnosis: Ruptured AAA   HPI:   This is a 80 y.o. female who is being worked up for a lung nodule. She had a PET scan performed today and the CT of the abdomen and pelvis showed a contained ruptured abdominal aortic aneurysm. She was sent urgently to [facility] for vascular surgery consultation. The patient had been having some abdominal pain but currently is not having abdominal pain. She is oriented to place but was not oriented to time. I did speak with her daughter who said she's had some urinary tract infections recently and gets confused when she is having these infections. The situation is further complicated by the fact that she has a VP shunt.  She does have a significant cardiac history although recent cardiac workup was fairly unremarkable. She is undergone previous PTCA in 1995 and she has a remote history of a heart attack. Currently denies any chest pain or chest pressure.  Hospital Course:  The patient was admitted to the hospital and taken to the operating room on 01/27/2015 - 01/28/2015 and underwent: 1. Open repair of ruptured abdominal aortic aneurysm 2. Aorto right femoral left common iliac artery bypass (18 x 9 Dacron graft) 3. Right to left femorofemoral bypass with 9 mm Dacron graft 4. Lysis of adhesions    The pt tolerated the procedure well and was transported to the PACU in good condition.   By POD 1, the pt was extubated.  She did require NTG for BP control.   By POD 2, she was having minimal output from the NGT and it was removed.  She tolerated this well and did not have any nausea or vomiting.  Her abdomen was soft and she did have palpable DP pulses bilaterally.   The pt remained on NTG through POD 3, but this was weaned successfully as her po medications were also added back to  her regimen.  Her renal function continued to improve throughout her post op course.  She did have some postoperative leukocytosis that did resolve.  OT/PT recommended HHPT.  She was transferred to the floor on POD 5.  On POD 6, she did have a mental status change that Dr. Scot Dock felt was related to her underlying dementia, age and recent surgery.  He spoke with her daughter and she did have some underlying dementia preop.  He felt the safest thing wound be to get her home to a safe environment as soon as possible.    By POD   The remainder of the hospital course consisted of increasing mobilization and increasing intake of solids without difficulty.  CBC    Component Value Date/Time   WBC 8.3 02/02/2015 1142   RBC 3.89 02/02/2015 1142   HGB 11.3* 02/02/2015 1142   HCT 35.5* 02/02/2015 1142   PLT 289 02/02/2015 1142   MCV 91.3 02/02/2015 1142   MCH 29.0 02/02/2015 1142   MCHC 31.8 02/02/2015 1142   RDW 16.1* 02/02/2015 1142   LYMPHSABS 1.4 12/15/2014 1704   MONOABS 0.4 12/15/2014 1704   EOSABS 0.1 12/15/2014 1704   BASOSABS 0.1 12/15/2014 1704    BMET    Component Value Date/Time   NA 140 02/02/2015 1142   K 2.8* 02/02/2015 1142   CL 108 02/02/2015 1142   CO2 23 02/02/2015 1142   GLUCOSE 91 02/02/2015 1142  BUN <5* 02/02/2015 1142   CREATININE 0.64 02/02/2015 1142   CREATININE 1.12* 12/15/2014 1704   CALCIUM 8.9 02/02/2015 1142   GFRNONAA >60 02/02/2015 1142   GFRNONAA 50* 04/02/2014 1116   GFRAA >60 02/02/2015 1142   GFRAA 58* 04/02/2014 1116     Discharge Instructions    ABDOMINAL PROCEDURE/ANEURYSM REPAIR/AORTO-BIFEMORAL BYPASS:  Call MD for increased abdominal pain; cramping diarrhea; nausea/vomiting    Complete by:  As directed      Call MD for:  redness, tenderness, or signs of infection (pain, swelling, bleeding, redness, odor or green/yellow discharge around incision site)    Complete by:  As directed      Call MD for:  severe or increased pain, loss or  decreased feeling  in affected limb(s)    Complete by:  As directed      Call MD for:  temperature >100.5    Complete by:  As directed      Discharge wound care:    Complete by:  As directed   Wash the groin wound with soap and water daily and pat dry. (No tub bath-only shower)  Then put a dry gauze or washcloth there to keep this area dry daily and as needed.  Do not use Vaseline or neosporin on your incisions.  Only use soap and water on your incisions and then protect and keep dry.     Driving Restrictions    Complete by:  As directed   No driving for 2 weeks     Lifting restrictions    Complete by:  As directed   No lifting for 4 weeks     Resume previous diet    Complete by:  As directed            Discharge Diagnosis:  Ruptured AAA  Secondary Diagnosis: Patient Active Problem List   Diagnosis Date Noted  . AAA (abdominal aortic aneurysm, ruptured) 01/27/2015  . Cognitive impairment   . Altered awareness, transient   . Solitary pulmonary nodule   . Elevated troponin   . Pulmonary nodules   . Encephalopathy acute 01/07/2015  . Altered mental status 01/07/2015  . Hyperthyroidism 12/30/2014  . Narcotic induced mental alteration- increase confusion 12/29/2014  . Acute kidney injury 12/24/2014  . AKI (acute kidney injury) 12/24/2014  . Acute encephalopathy 12/24/2014  . Dementia 12/24/2014  . Essential hypertension, benign 12/24/2014  . Hydrocephalus 12/20/2013  . Auditory hallucinations 12/16/2013  . Visual hallucinations 12/16/2013  . Acquired obstructive hydrocephalus 12/16/2013  . Posterior fossa tumor 12/16/2013  . Delirium 11/21/2013  . Syncope 11/21/2013  . Lung nodule 11/21/2013  . Hypercalcemia 01/22/2012  . Hyperlipidemia 01/22/2012  . Tobacco user 01/22/2012  . HTN (hypertension), benign 01/13/2012  . Vitamin D deficiency 01/13/2012  . Osteoporosis 01/13/2012  . CAD (coronary artery disease) 01/13/2012   Past Medical History  Diagnosis Date  .  Hyperlipidemia   . Essential hypertension, benign   . CAD (coronary artery disease)   . Osteoporosis   . Hypercalcemia   . Vitamin D deficiency   . Ovarian tumor   . Breast tumor   . MI (myocardial infarction) July 1995    Treated with LAD PCI- Dr Adora Fridge  . Cataract   . Complication of anesthesia   . PONV (postoperative nausea and vomiting)   . Lung tumor     recently found approx 25 days ago-Kristen Tamala Julian, MD       Medication List    TAKE these medications  acetaminophen 325 MG tablet  Commonly known as:  TYLENOL  Take 1 tablet (325 mg total) by mouth every 4 (four) hours as needed for mild pain.     aspirin 81 MG tablet  Take 81 mg by mouth daily.     loratadine 10 MG tablet  Commonly known as:  CLARITIN  Take 1 tablet (10 mg total) by mouth daily as needed for allergies.     losartan 100 MG tablet  Commonly known as:  COZAAR  Take 100 mg by mouth daily.     methimazole 5 MG tablet  Commonly known as:  TAPAZOLE  Take 3 tablets (15 mg total) by mouth 3 (three) times daily.     metoprolol succinate 50 MG 24 hr tablet  Commonly known as:  TOPROL-XL  Take 1 tablet (50 mg total) by mouth daily.     multivitamin with minerals Tabs tablet  Take 1 tablet by mouth daily.     pravastatin 40 MG tablet  Commonly known as:  PRAVACHOL  Take 1 tablet (40 mg total) by mouth daily.     risperiDONE 0.5 MG tablet  Commonly known as:  RISPERDAL  Take 1 tablet (0.5 mg total) by mouth at bedtime.     vitamin B-12 1000 MCG tablet  Commonly known as:  CYANOCOBALAMIN  Take 1 tablet (1,000 mcg total) by mouth daily.        Prescriptions given: 1.  None-Tylenol for pain  Instructions: 1.  Wash the groin wound with soap and water daily and pat dry. (No tub bath-only shower)  Then put a dry gauze or washcloth there to keep this area dry daily and as needed.  Do not use Vaseline or neosporin on your incisions.  Only use soap and water on your incisions and then protect  and keep dry.  Disposition: home with daughter  Patient's condition: is Fair  Follow up: 1. Dr. Scot Dock in 2 weeks   Leontine Locket, PA-C [facility] [phone] 02/03/2015  10:37 AM   - For VQI Registry use ---   Post-op:  Time to Extubation: [x]  In OR, [ ]  < 12 hrs, [ ]  12-24 hrs, [ ]  >=24 hrs Vasopressors Req. Post-op: No ICU Stay: 3 day in ICU Transfusion: Yes  If yes, 2 units given MI: No, [ ]  Troponin only, [ ]  EKG or Clinical New Arrhythmia: No  Complications: CHF: No Resp failure: No, [ ]  Pneumonia, [ ]  Ventilator Chg in renal function: No, [ ]  Inc. Cr > 0.5, [ ]  Temp. Dialysis, [ ]  Permanent dialysis Leg ischemia: No, no Surgery needed, [ ]  Yes, Surgery needed, [ ]  Amputation Bowel ischemia: No, [ ]  Medical Rx, [ ]  Surgical Rx Wound complication: No, [ ]  Superficial separation/infection, [ ]  Return to OR Return to OR: No  Return to OR for bleeding: No Stroke: No, [ ]  Minor, [ ]  Major  Discharge medications: Statin use:  Yes If No: [ ]  For Medical reasons, [ ]  Non-compliant ASA use:  Yes  If No: [ ]  For Medical reasons, [ ]  Non-compliant Plavix use:  No If No: [ ]  For Medical reasons, [ ]  Non-compliant Beta blocker use:  Yes If No: [ ]  For Medical reasons, [ ]  Non-compliant

## 2015-02-03 NOTE — Progress Notes (Signed)
PT Cancellation Note  Patient Details Name: LORALIE MALTA MRN: [id number] DOB: 03-08-1935   Cancelled Treatment:    Reason Eval/Treat Not Completed: Patient declined, no reason specified. Max encouragement provided for pt participation. Pt refused to work with therapy or get OOB. Will continue to follow.    Rolinda Roan 02/03/2015, 10:30 AM   Rolinda Roan, PT, DPT Acute Rehabilitation Services Pager: [phone]

## 2015-02-03 NOTE — Progress Notes (Signed)
Patient reviewed discharge with family and patient. IV removed. Family taking family home today already in the room.  Will escort out via wheelchair.   Tianni Escamilla, Mervin Kung  RN

## 2015-02-03 NOTE — Telephone Encounter (Signed)
Spoke with daughter and reviewed PET scan results. Hospital follow-up tomorrow.

## 2015-02-03 NOTE — Progress Notes (Addendum)
  Progress Note    02/03/2015 7:58 AM 7 Days Post-Op  Subjective:  Sleeping-awakes and tells me she doesn't have any pain  Tm 99 now afebrile VSS 95% RA  Filed Vitals:   02/03/15 0419  BP: 188/96  Pulse: 113  Temp: 98.4 F (36.9 C)  Resp: 18    Physical Exam: Bilateral feet are warm Wounds looked good yesterday  CBC    Component Value Date/Time   WBC 8.3 02/02/2015 1142   RBC 3.89 02/02/2015 1142   HGB 11.3* 02/02/2015 1142   HCT 35.5* 02/02/2015 1142   PLT 289 02/02/2015 1142   MCV 91.3 02/02/2015 1142   MCH 29.0 02/02/2015 1142   MCHC 31.8 02/02/2015 1142   RDW 16.1* 02/02/2015 1142   LYMPHSABS 1.4 12/15/2014 1704   MONOABS 0.4 12/15/2014 1704   EOSABS 0.1 12/15/2014 1704   BASOSABS 0.1 12/15/2014 1704    BMET    Component Value Date/Time   NA 140 02/02/2015 1142   K 2.8* 02/02/2015 1142   CL 108 02/02/2015 1142   CO2 23 02/02/2015 1142   GLUCOSE 91 02/02/2015 1142   BUN <5* 02/02/2015 1142   CREATININE 0.64 02/02/2015 1142   CREATININE 1.12* 12/15/2014 1704   CALCIUM 8.9 02/02/2015 1142   GFRNONAA >60 02/02/2015 1142   GFRNONAA 50* 04/02/2014 1116   GFRAA >60 02/02/2015 1142   GFRAA 58* 04/02/2014 1116    INR    Component Value Date/Time   INR 1.10 01/27/2015 1803     Intake/Output Summary (Last 24 hours) at 02/03/15 0758 Last data filed at 02/02/15 1700  Gross per 24 hour  Intake    240 ml  Output      0 ml  Net    240 ml     Assessment:  80 y.o. female is s/p:  Repair ruptured AAA with  aorto right femoral left common iliac graft and right to left femorofemoral bypass graft. In addition, she required lysis of adhesions. 7 Days Post-Op  Plan: -pt sleeping this morning-she was combative this am and did receive one dose of Haldol. -Pt's feet are warm -will discharge pt home today if ok with Dr. Scot Dock to get her back to her home surroundings and familiarity.   Leontine Locket, PA-C [facility] [phone] 02/03/2015 7:58 AM    Agree with above.  Mental status improved today. OK for D/C today.  Deitra Mayo, MD, [place] [phone] Office: [phone]

## 2015-02-03 NOTE — Care Management Note (Signed)
Case Management Note  Patient Details  Name: [name] MRN: [id number] Date of Birth: September 12, 1934  Subjective/Objective:      Pt 6 days s/p AAA              Action/Plan:  Per pts daughter (pt has dementia) pt is independent from home.  Pts daughter is currently staying at pts home and has made arrangements to stay with pt post discharge.   Pt has recently been in SNF for rehab but discharged home prior to this admit.   Expected Discharge Date:                  Expected Discharge Plan:  [place]  In-House Referral:     Discharge planning Services  CM Consult  Post Acute Care Choice:    Choice offered to:  Pt deferred to daughter [place]  DME Arranged:  3:1 DME Agency:  [place]:  PT,OT, RN [facility] Agency:  [facility]  Status of Service:  Complete, will sign off  Medicare Important Message Given:  Yes-third notification given Date Medicare IM Given:    Medicare IM give by:    Date Additional Medicare IM Given:    Additional Medicare Important Message give by:     If discussed at [place] of Stay Meetings, dates discussed:    Additional Comments: 02/03/15 Elenor Quinones, RN, BSN [phone] Pt will discharge home with daughter  02/02/15  CM offered pt choice, pt asked CM to contact daughter [name]. Kristi chose [place] for [facility] and DME, agency contacted, referral accepted.   Pts daughter will provide 24 hour supervision post discharge. Maryclare Labrador, RN 02/03/2015, 11:22 AM

## 2015-02-03 NOTE — Clinical Social Work Note (Signed)
CSW received referral for SNF.  Case discussed with case manager, and plan is to discharge home with home health.  CSW to sign off please re-consult if social work needs arise.  Consuela Widener R. Jazyah Butsch, MSW, LCSWA [phone]  
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
## 2015-02-03 NOTE — Progress Notes (Signed)
OT Cancellation Note  Patient Details Name: [name] MRN: [id number] DOB: 1935/05/08   Cancelled Treatment:    Reason Eval/Treat Not Completed: Other (comment). Pt declined to work with OT despite encouragement and reported she had pain and did not feel it was necessary.  Benito Mccreedy OTR/L [phone] 02/03/2015, 10:45 AM

## 2015-02-04 ENCOUNTER — Encounter: Payer: Self-pay | Admitting: Family Medicine

## 2015-02-04 ENCOUNTER — Ambulatory Visit (INDEPENDENT_AMBULATORY_CARE_PROVIDER_SITE_OTHER): Payer: Medicare Other | Admitting: Family Medicine

## 2015-02-04 VITALS — BP 142/80 | HR 98 | Temp 98.3°F | Resp 16 | Ht 62.25 in | Wt 127.0 lb

## 2015-02-04 DIAGNOSIS — I10 Essential (primary) hypertension: Secondary | ICD-10-CM | POA: Diagnosis not present

## 2015-02-04 DIAGNOSIS — E0590 Thyrotoxicosis, unspecified without thyrotoxic crisis or storm: Secondary | ICD-10-CM

## 2015-02-04 DIAGNOSIS — D496 Neoplasm of unspecified behavior of brain: Secondary | ICD-10-CM

## 2015-02-04 DIAGNOSIS — E876 Hypokalemia: Secondary | ICD-10-CM

## 2015-02-04 DIAGNOSIS — R410 Disorientation, unspecified: Secondary | ICD-10-CM | POA: Diagnosis not present

## 2015-02-04 DIAGNOSIS — R918 Other nonspecific abnormal finding of lung field: Secondary | ICD-10-CM | POA: Diagnosis not present

## 2015-02-04 DIAGNOSIS — G919 Hydrocephalus, unspecified: Secondary | ICD-10-CM | POA: Diagnosis not present

## 2015-02-04 DIAGNOSIS — I2510 Atherosclerotic heart disease of native coronary artery without angina pectoris: Secondary | ICD-10-CM | POA: Diagnosis not present

## 2015-02-04 DIAGNOSIS — D432 Neoplasm of uncertain behavior of brain, unspecified: Secondary | ICD-10-CM

## 2015-02-04 DIAGNOSIS — E785 Hyperlipidemia, unspecified: Secondary | ICD-10-CM

## 2015-02-04 DIAGNOSIS — I713 Abdominal aortic aneurysm, ruptured, unspecified: Secondary | ICD-10-CM

## 2015-02-04 DIAGNOSIS — R441 Visual hallucinations: Secondary | ICD-10-CM

## 2015-02-04 DIAGNOSIS — I2583 Coronary atherosclerosis due to lipid rich plaque: Secondary | ICD-10-CM

## 2015-02-04 LAB — CBC WITH DIFFERENTIAL/PLATELET
Basophils Absolute: 0 10*3/uL (ref 0.0–0.1)
Basophils Relative: 0 % (ref 0–1)
EOS PCT: 2 % (ref 0–5)
Eosinophils Absolute: 0.2 10*3/uL (ref 0.0–0.7)
HEMATOCRIT: 37.3 % (ref 36.0–46.0)
HEMOGLOBIN: 11.9 g/dL — AB (ref 12.0–15.0)
LYMPHS ABS: 0.8 10*3/uL (ref 0.7–4.0)
LYMPHS PCT: 8 % — AB (ref 12–46)
MCH: 28.3 pg (ref 26.0–34.0)
MCHC: 31.9 g/dL (ref 30.0–36.0)
MCV: 88.6 fL (ref 78.0–100.0)
MONO ABS: 0.5 10*3/uL (ref 0.1–1.0)
MONOS PCT: 5 % (ref 3–12)
MPV: 11.4 fL (ref 8.6–12.4)
Neutro Abs: 8.3 10*3/uL — ABNORMAL HIGH (ref 1.7–7.7)
Neutrophils Relative %: 85 % — ABNORMAL HIGH (ref 43–77)
Platelets: 412 10*3/uL — ABNORMAL HIGH (ref 150–400)
RBC: 4.21 MIL/uL (ref 3.87–5.11)
RDW: 15.3 % (ref 11.5–15.5)
WBC: 9.8 10*3/uL (ref 4.0–10.5)

## 2015-02-04 LAB — COMPREHENSIVE METABOLIC PANEL
ALT: 13 U/L (ref 6–29)
AST: 20 U/L (ref 10–35)
Albumin: 2.8 g/dL — ABNORMAL LOW (ref 3.6–5.1)
Alkaline Phosphatase: 83 U/L (ref 33–130)
BILIRUBIN TOTAL: 0.4 mg/dL (ref 0.2–1.2)
BUN: 8 mg/dL (ref 7–25)
CO2: 29 mmol/L (ref 20–31)
CREATININE: 0.96 mg/dL — AB (ref 0.60–0.93)
Calcium: 9 mg/dL (ref 8.6–10.4)
Chloride: 103 mmol/L (ref 98–110)
GLUCOSE: 187 mg/dL — AB (ref 65–99)
Potassium: 3.3 mmol/L — ABNORMAL LOW (ref 3.5–5.3)
SODIUM: 142 mmol/L (ref 135–146)
Total Protein: 5.5 g/dL — ABNORMAL LOW (ref 6.1–8.1)

## 2015-02-04 LAB — TSH: TSH: 1.589 u[IU]/mL (ref 0.350–4.500)

## 2015-02-04 LAB — T4, FREE: FREE T4: 1.25 ng/dL (ref 0.80–1.80)

## 2015-02-04 MED ORDER — METHIMAZOLE 5 MG PO TABS: 15.0000 mg | ORAL_TABLET | Freq: Three times a day (TID) | ORAL | Status: DC

## 2015-02-04 NOTE — Progress Notes (Signed)
Subjective:    Patient ID: [name], female    DOB: Dec 14, 1934, 80 y.o.   MRN: [id number]  02/04/2015  Follow-up and Medication Refill   HPI This 80 y.o. female presents for [facility] for recent hospitalization for ruptured AAA s/p surgical repair.  Here is the summary of her recent hospital discharge:  Admission Date: 01/27/2015  Discharge Date: 02/03/15  Physician: Angelia Mould, MD  Admission Diagnosis: Ruptured AAA   HPI:  This is a 80 y.o. female who is being worked up for a lung nodule. She had a PET scan performed today and the CT of the abdomen and pelvis showed a contained ruptured abdominal aortic aneurysm. She was sent urgently to [facility] for vascular surgery consultation. The patient had been having some abdominal pain but currently is not having abdominal pain. She is oriented to place but was not oriented to time. I did speak with her daughter who said she's had some urinary tract infections recently and gets confused when she is having these infections. The situation is further complicated by the fact that she has a VP shunt.  She does have a significant cardiac history although recent cardiac workup was fairly unremarkable. She is undergone previous PTCA in 1995 and she has a remote history of a heart attack. Currently denies any chest pain or chest pressure.  Hospital Course:  The patient was admitted to the hospital and taken to the operating room on 01/27/2015 - 01/28/2015.  The pt tolerated the procedure well and was transported to the PACU.  The remainder of the hospital course consisted of increasing mobilization and increasing intake of solids without difficulty.  CBC  Labs (Brief)       Component Value Date/Time   WBC 8.3 02/02/2015 1142   RBC 3.89 02/02/2015 1142   HGB 11.3* 02/02/2015 1142   HCT 35.5* 02/02/2015 1142   PLT 289 02/02/2015 1142   MCV 91.3 02/02/2015 1142   MCH 29.0 02/02/2015  1142   MCHC 31.8 02/02/2015 1142   RDW 16.1* 02/02/2015 1142   LYMPHSABS 1.4 12/15/2014 1704   MONOABS 0.4 12/15/2014 1704   EOSABS 0.1 12/15/2014 1704   BASOSABS 0.1 12/15/2014 1704      BMET  Labs (Brief)       Component Value Date/Time   NA 140 02/02/2015 1142   K 2.8* 02/02/2015 1142   CL 108 02/02/2015 1142   CO2 23 02/02/2015 1142   GLUCOSE 91 02/02/2015 1142   BUN <5* 02/02/2015 1142   CREATININE 0.64 02/02/2015 1142   CREATININE 1.12* 12/15/2014 1704   CALCIUM 8.9 02/02/2015 1142   GFRNONAA >60 02/02/2015 1142   GFRNONAA 50* 04/02/2014 1116   GFRAA >60 02/02/2015 1142   GFRAA 58* 04/02/2014 1116       Discharge Instructions    ABDOMINAL PROCEDURE/ANEURYSM REPAIR/AORTO-BIFEMORAL BYPASS: Call MD for increased abdominal pain; cramping diarrhea; nausea/vomiting  Complete by: As directed      Call MD for: redness, tenderness, or signs of infection (pain, swelling, bleeding, redness, odor or green/yellow discharge around incision site)  Complete by: As directed      Call MD for: severe or increased pain, loss or decreased feeling in affected limb(s)  Complete by: As directed      Call MD for: temperature >100.5  Complete by: As directed      Discharge wound care:  Complete by: As directed   Wash the groin wound with soap and water daily and pat dry. (  No tub bath-only shower) Then put a dry gauze or washcloth there to keep this area dry daily and as needed. Do not use Vaseline or neosporin on your incisions. Only use soap and water on your incisions and then protect and keep dry.     Driving Restrictions  Complete by: As directed   No driving for 2 weeks     Lifting restrictions  Complete by: As directed   No lifting for 4 weeks     Resume previous diet  Complete by: As directed            Discharge  Diagnosis:  Ruptured AAA  Secondary Diagnosis: Patient Active Problem List   Diagnosis Date Noted  . AAA (abdominal aortic aneurysm, ruptured) 01/27/2015  . Cognitive impairment   . Altered awareness, transient   . Solitary pulmonary nodule   . Elevated troponin   . Pulmonary nodules   . Encephalopathy acute 01/07/2015  . Altered mental status 01/07/2015  . Hyperthyroidism 12/30/2014  . Narcotic induced mental alteration- increase confusion 12/29/2014  . Acute kidney injury 12/24/2014  . AKI (acute kidney injury) 12/24/2014  . Acute encephalopathy 12/24/2014  . Dementia 12/24/2014  . Essential hypertension, benign 12/24/2014  . Hydrocephalus 12/20/2013  . Auditory hallucinations 12/16/2013  . Visual hallucinations 12/16/2013  . Acquired obstructive hydrocephalus 12/16/2013  . Posterior fossa tumor 12/16/2013  . Delirium 11/21/2013  . Syncope 11/21/2013  . Lung nodule 11/21/2013  . Hypercalcemia 01/22/2012  . Hyperlipidemia 01/22/2012  . Tobacco user 01/22/2012  . HTN (hypertension), benign 01/13/2012  . Vitamin D deficiency 01/13/2012  . Osteoporosis 01/13/2012  . CAD (coronary artery disease)         Medication List    TAKE these medications       acetaminophen 325 MG tablet  Commonly known as: TYLENOL  Take 1 tablet (325 mg total) by mouth every 4 (four) hours as needed for mild pain.     aspirin 81 MG tablet  Take 81 mg by mouth daily.     loratadine 10 MG tablet  Commonly known as: CLARITIN  Take 1 tablet (10 mg total) by mouth daily as needed for allergies.     losartan 100 MG tablet  Commonly known as: COZAAR  Take 100 mg by mouth daily.     methimazole 5 MG tablet  Commonly known as: TAPAZOLE  Take 3 tablets (15 mg total) by mouth 3 (three) times daily.     metoprolol succinate 50 MG 24 hr tablet  Commonly known as:  TOPROL-XL  Take 1 tablet (50 mg total) by mouth daily.     multivitamin with minerals Tabs tablet  Take 1 tablet by mouth daily.     pravastatin 40 MG tablet  Commonly known as: PRAVACHOL  Take 1 tablet (40 mg total) by mouth daily.     risperiDONE 0.5 MG tablet  Commonly known as: RISPERDAL  Take 1 tablet (0.5 mg total) by mouth at bedtime.     vitamin B-12 1000 MCG tablet  Commonly known as: CYANOCOBALAMIN  Take 1 tablet (1,000 mcg total) by mouth daily.        Prescriptions given: 1. None-Tylenol for pain  Instructions: 1. Wash the groin wound with soap and water daily and pat dry. (No tub bath-only shower) Then put a dry gauze or washcloth there to keep this area dry daily and as needed. Do not use Vaseline or neosporin on your incisions. Only use soap and water on your incisions and  then protect and keep dry.  Disposition: home with daughter  Patient's condition: is Fair  Follow up: 1. Dr. Scot Dock in 2 weeks         AAA rupture s/p repair:  Discharged yesterday from hospital.  Will eat a little but must really be encouraged.  She got up and drank an Ensure today.  Walking with walker in the home.  No falls since June.  PT worked with patient during hospitalization.  Yesterday standing up perfectly fine; today she is struggling.  During hospitalizations, pt shut down and let everyone do everything for her.  No fever/chills/sweats.  No n/v.  +loose stools onset prior to hospitalization; no bloody stools.  No syncope.  No dizziness.  [facility] The nurse coming out; one is coming out tomorrow night with sitting service to be companion, help with bathroom, remind to eat, administer medication.  Pt reluctant about it.  Second nurse with [facility]; to schedule initial evaluation today but to reschedule tomorrow or Friday.  Urinating seems fine.  No chest pain, palpitations, SOB, leg swelling. Having some pain in RLQ; has complained twice today of this RLQ pain.   Complained a few times of pain during admission.  During ICU admission, administered Morphine.  Lethargic a lot but onset prior to admission for AAA repair; this is all day long.  Takes a lot of coaxing.  Forced to get in hospital.  PT to come out but had to reschedule due to aortic aneurysm admission.  Needs PT/OT/nursing.    Loose stools: had one single loose stool yesterday; large amounts.  No stool today so far.  Discussed C. Difficile as etiology during admission.  Nursing did not feel that suffering with C. Difficile infection.   Loose stools have been going on for a while per the daughter.  Noticed around 12/11/14.    Pulmonary nodules:  S/p PET scan; no active nodules; all nodules had decreased in size.  Hyperthyroidism:  Patient reports good compliance with medication, good tolerance to medication, and good symptom control.  Appointment with endocrinology had to be rescheduled.  Review of Systems  Constitutional: Positive for activity change and appetite change. Negative for fever, chills, diaphoresis and fatigue.  Eyes: Negative for visual disturbance.  Respiratory: Negative for cough and shortness of breath.   Cardiovascular: Negative for chest pain, palpitations and leg swelling.  Gastrointestinal: Positive for diarrhea and abdominal distention. Negative for nausea, vomiting, abdominal pain, constipation, blood in stool and anal bleeding.  Endocrine: Negative for cold intolerance, heat intolerance, polydipsia, polyphagia and polyuria.  Neurological: Negative for dizziness, tremors, seizures, syncope, facial asymmetry, speech difficulty, weakness, light-headedness, numbness and headaches.  Psychiatric/Behavioral: Negative for dysphoric mood. The patient is not nervous/anxious.     Past Medical History  Diagnosis Date  . Hyperlipidemia   . Essential hypertension, benign   . CAD (coronary artery disease)   . Osteoporosis   . Hypercalcemia   . Vitamin D deficiency   . Ovarian tumor    . Breast tumor   . MI (myocardial infarction) July 1995    Treated with LAD PCI- Dr Adora Fridge  . Cataract   . Complication of anesthesia   . PONV (postoperative nausea and vomiting)   . Lung tumor     recently found approx 25 days Simone Curia, MD   Past Surgical History  Procedure Laterality Date  . Angioplasty    . Hip pinning    . Breast lumpectomy    . Abdominal hysterectomy    .  Cholecystectomy    . Fracture surgery  Nov 2003    L distal radius fx- Dr. Frederik Pear  . Eye surgery      Cataract removal  . Cardiac catheterization      1990's  . Ventriculoperitoneal shunt Right 12/20/2013    Procedure: SHUNT INSERTION VENTRICULAR-PERITONEAL;  Surgeon: Winfield Cunas, MD;  Location: [facility];  Service: Neurosurgery;  Laterality: Right;  VP shunt insertion  . Doppler echocardiography  2010  . Abdominal aortic aneurysm repair N/A 01/27/2015    Procedure: Resection and Grafting Ruptured Abdominal Aortic Aneurysm with Aorta-Right Femoral, Left Common Iliac Graft;  Surgeon: Angelia Mould, MD;  Location: [facility];  Service: Vascular;  Laterality: N/A;  . Lysis of adhesion N/A 01/27/2015    Procedure: LYSIS OF ADHESIONS;  Surgeon: Angelia Mould, MD;  Location: [facility];  Service: Vascular;  Laterality: N/A;  . Femoral-femoral bypass graft N/A 01/27/2015    Procedure: BYPASS GRAFT FEMORAL-FEMORAL ARTERY;  Surgeon: Angelia Mould, MD;  Location: [facility] OR;  Service: Vascular;  Laterality: N/A;   Allergies  Allergen Reactions  . Sulfa Antibiotics Swelling  . Codeine Nausea And Vomiting  . Hydrocodone Other (See Comments)    Confusion  . Latex Itching    Patient states it makes her 'feel itchy'  . Other Nausea And Vomiting    Some type of pain pill given for allergic reaction   . Tape Other (See Comments)    Causes skin tears, please use "paper" tape only.   Current Outpatient Prescriptions  Medication Sig Dispense Refill  . aspirin 81 MG tablet Take 81 mg by mouth  daily.    Marland Kitchen loratadine (CLARITIN) 10 MG tablet Take 1 tablet (10 mg total) by mouth daily as needed for allergies.    Marland Kitchen losartan (COZAAR) 100 MG tablet Take 100 mg by mouth daily.  11  . metoprolol succinate (TOPROL-XL) 50 MG 24 hr tablet Take 1 tablet (50 mg total) by mouth daily. 30 tablet 9  . Multiple Vitamin (MULTIVITAMIN WITH MINERALS) TABS tablet Take 1 tablet by mouth daily. 30 tablet 0  . pravastatin (PRAVACHOL) 40 MG tablet Take 1 tablet (40 mg total) by mouth daily. 30 tablet 10  . risperiDONE (RISPERDAL) 0.5 MG tablet Take 1 tablet (0.5 mg total) by mouth at bedtime. (Patient not taking: Reported on 02/18/2015) 30 tablet 5  . vitamin B-12 (CYANOCOBALAMIN) 1000 MCG tablet Take 1 tablet (1,000 mcg total) by mouth daily. 30 tablet 0  . acetaminophen (TYLENOL) 325 MG tablet Take 1 tablet (325 mg total) by mouth every 4 (four) hours as needed for mild pain.    . methimazole (TAPAZOLE) 5 MG tablet Take 3 tablets (15 mg total) by mouth 3 (three) times daily. 270 tablet 5   No current facility-administered medications for this visit.   Social History   Social History  . Marital Status: Widowed    Spouse Name: N/A  . Number of Children: 1  . Years of Education: N/A   Occupational History  . retired    Social History Main Topics  . Smoking status: Former Smoker -- 1.00 packs/day for 60 years    Types: Cigarettes    Quit date: 12/19/2014  . Smokeless tobacco: Never Used  . Alcohol Use: No  . Drug Use: No  . Sexual Activity: No   Other Topics Concern  . Not on file   Social History Narrative   Marital status: widowed.      Children:  one daughter [name] [phone])      Employment: retired; Master's degree.      Lives: alone.      Tobacco: daily smoking.       Objective:    BP 142/80 mmHg  Pulse 98  Temp(Src) 98.3 F (36.8 C) (Oral)  Resp 16  Ht 5' 2.25" (1.581 m)  Wt 127 lb (57.607 kg)  BMI 23.05 kg/m2 Physical Exam  Constitutional: She is oriented to person,  place, and time. She appears well-developed and well-nourished. No distress.  Lethargic; laying on exam table supine; falls asleep during visit.  HENT:  Head: Normocephalic and atraumatic.  Right Ear: External ear normal.  Left Ear: External ear normal.  Nose: Nose normal.  Mouth/Throat: Oropharynx is clear and moist.  Eyes: Conjunctivae and EOM are normal. Pupils are equal, round, and reactive to light.  Neck: Normal range of motion. Neck supple. Carotid bruit is not present. No thyromegaly present.  Cardiovascular: Normal rate, regular rhythm, normal heart sounds and intact distal pulses.  Exam reveals no gallop and no friction rub.   No murmur heard. Pulmonary/Chest: Effort normal and breath sounds normal. She has no wheezes. She has no rales.  Abdominal: Soft. Bowel sounds are normal. She exhibits distension. She exhibits no mass. There is no tenderness. There is no rebound and no guarding.  Well-healed incision.  Lymphadenopathy:    She has no cervical adenopathy.  Neurological: She is alert and oriented to person, place, and time. No cranial nerve deficit. She exhibits normal muscle tone.  Skin: Skin is warm and dry. No rash noted. She is not diaphoretic. No erythema. No pallor.  Psychiatric: She has a normal mood and affect. Her behavior is normal.        Assessment & Plan:   1. AAA (abdominal aortic aneurysm, ruptured)   2. Visual hallucinations   3. Pulmonary nodules   4. Posterior fossa tumor   5. Hyperthyroidism   6. Hyperlipidemia   7. Hypercalcemia   8. Hydrocephalus   9. HTN (hypertension), benign   10. Delirium   11. Coronary artery disease due to lipid rich plaque   12. Hypokalemia     1. AAA ruptured: s/p repair; s/p admission with discharge yesterday; recovering well. Excellent family support from only daughter; having assistants coming into home for ADLs for daughter to return to work. 2.  Visual hallucinations: decreased; hypersomnolence prior to  admission; decrease Risperdal to 0.5mg  1/2 qhs. 3.  Pulmonary nodules: s/p PET scan; decrease in size of nodules; no suggestion of malignancy. 4.  Posterior fossa tumor: stable. Chronic for 25 years. 5.  Hyperthyroidism: stable; obtain labs; refill of Methimazole provided; appointment with endocrinology in upcoming month. 6.  Hyperlipidemia: stable on statin. 7.  Hypercalcemia: improved during admission; repeat today. 8.  Hydrocephaly: stable with VP shunt in place. 9.  Delirium: stable; intermittent during admission; referral for neuropsychiatric evaluation pending.   10. CAD: stable; asymptomatic. 11. Hypokalemia: at hospital discharge; repeat today.   Prolonged face to face for 60 minutes with 50% of time committed to coordination of care and counseling.   Orders Placed This Encounter  Procedures  . CBC with Differential/Platelet  . Comprehensive metabolic panel  . T4, free  . TSH    Meds ordered this encounter  Medications  . DISCONTD: methimazole (TAPAZOLE) 5 MG tablet    Sig: Take 3 tablets (15 mg total) by mouth 3 (three) times daily.    Dispense:  270 tablet    Refill:  5    Return in about 2 weeks (around 02/18/2015) for recheck.   Hersh Minney Elayne Guerin, M.D. [facility] [address] [place], [place]  [address] [phone] phone [phone] fax

## 2015-02-04 NOTE — Patient Instructions (Signed)
1.  Decrease Risperdal 0.5mg  to 1/2 tablet at bedtime for one week. Then after one week, stop medication if still sleepy.

## 2015-02-05 ENCOUNTER — Ambulatory Visit: Payer: Self-pay | Admitting: Internal Medicine

## 2015-02-08 ENCOUNTER — Telehealth: Payer: Self-pay | Admitting: *Deleted

## 2015-02-08 ENCOUNTER — Other Ambulatory Visit: Payer: Self-pay | Admitting: *Deleted

## 2015-02-08 MED ORDER — METHIMAZOLE 5 MG PO TABS: 15.0000 mg | ORAL_TABLET | Freq: Three times a day (TID) | ORAL | Status: AC

## 2015-02-08 NOTE — Telephone Encounter (Signed)
Pharmacy called regarding this pt.  States pt did not receive medication from 02/04/15 of methimazole (TAPAZOLE) 5 MG tablet.  After reviewing pt chart medication was sent with instructions of No Print.  I advised pharmacy that I would resend medication.  They understood.

## 2015-02-08 NOTE — Telephone Encounter (Signed)
Noted! Thank you

## 2015-02-11 ENCOUNTER — Telehealth: Payer: Self-pay

## 2015-02-11 ENCOUNTER — Encounter: Payer: Self-pay | Admitting: Family Medicine

## 2015-02-11 NOTE — Telephone Encounter (Signed)
An hour and 1/2 of sleep after new medication adjustment.  Very similar to what it was prior to the change.    [name] [phone]

## 2015-02-11 NOTE — Telephone Encounter (Signed)
Dr. Tamala Julian, any suggestions?

## 2015-02-13 NOTE — Telephone Encounter (Signed)
I do not understand this message; please call daughter for clarification.

## 2015-02-13 NOTE — Telephone Encounter (Signed)
Called and spoke with kristi-- Medication was adjusted for the pt and this medication was decreased by half of the dose.  She stated that the patient is sleeping about 22 hours a day.  In the last 3 days she has had an increased in strange comments and increase in not wanting to take her medications. Having some behavior issues that she had before these medications were started.  High level of sleeping daily.  Daughter stated that the care taker has noticed changes in the patient and that the patient has not wanted to take her medications at times.  Will forward back to Dr. Tamala Julian to make her aware. Dr. Tamala Julian please advise. thanks

## 2015-02-17 ENCOUNTER — Encounter: Payer: Self-pay | Admitting: Vascular Surgery

## 2015-02-17 NOTE — Telephone Encounter (Signed)
Spoke with daughter/[name].  Had a very active day; had some major plumbing issues in house.  Pt has been up and walking around.  Today has been the best day since hospital discharge and pt has been sassy.  Caretaker has noticed a big change.  Before today, caregiver has noted 2 hours of awakefulness during the day.  Pt is laying down in bed a lot but not completely asleep.  Still dazed off a lot.  Family and friends have noticed the daze like she is somewhere else which is pretty common.  Had become uncooperative for the past few days; refusing medications. Has taken medication today.  She did take medications every day but had to be forced on her.  Patient made the comment last week, "what is all the drama at church?"  Pt has not been to church in months.  Today, she did make a comment, "I'm not going to take a bath in sewage" which was not the issue. There are kitchen plumbing issues.  Abdomen is getting swollen/large/distended.  Eating better.  Appointment with surgeon tomorrow.  Intermittent abdominal pain a few days ago.  Daughter asking about pain twice daily; usually denies pain.  A/P: hypersomnolence: persistent with unusual behavior; STOP Risperdal until next visit in two weeks.

## 2015-02-18 ENCOUNTER — Telehealth: Payer: Self-pay

## 2015-02-18 ENCOUNTER — Encounter: Payer: Self-pay | Admitting: Vascular Surgery

## 2015-02-18 ENCOUNTER — Ambulatory Visit (INDEPENDENT_AMBULATORY_CARE_PROVIDER_SITE_OTHER): Payer: Self-pay | Admitting: Vascular Surgery

## 2015-02-18 VITALS — BP 154/80 | HR 81 | Temp 98.4°F | Resp 16 | Ht 61.0 in | Wt 115.0 lb

## 2015-02-18 DIAGNOSIS — R441 Visual hallucinations: Secondary | ICD-10-CM

## 2015-02-18 DIAGNOSIS — R410 Disorientation, unspecified: Secondary | ICD-10-CM

## 2015-02-18 DIAGNOSIS — I713 Abdominal aortic aneurysm, ruptured, unspecified: Secondary | ICD-10-CM

## 2015-02-18 DIAGNOSIS — G9340 Encephalopathy, unspecified: Secondary | ICD-10-CM

## 2015-02-18 DIAGNOSIS — R4189 Other symptoms and signs involving cognitive functions and awareness: Secondary | ICD-10-CM

## 2015-02-18 NOTE — Progress Notes (Signed)
Filed Vitals:   02/18/15 1422 02/18/15 1430 02/18/15 1434  BP: 154/94 163/108 154/80  Pulse: 83 83 81  Temp: 98.4 F (36.9 C)    TempSrc: Oral    Resp: 16    Height: 5\' 1"  (1.549 m)    Weight: 115 lb (52.164 kg)    SpO2: 99%

## 2015-02-18 NOTE — Progress Notes (Signed)
Patient ID: LUS KRIEGEL, female   DOB: [birth year], 80 y.o.   MRN: [id number]   Patient name: [name] MRN: [id number] DOB: [birth year] Sex: female  REASON FOR VISIT: Follow up after open repair of ruptured abdominal aortic aneurysm  HPI: [name] is a 80 y.o. female this patient was found to have a contained ruptured abdominal aortic aneurysm. He underwent open repair on 8-16. He required an aorto right femoral and left common iliac artery bypass with an 18 x 9 Dacron graft. In addition he required a right to left femorofemoral bypass graft as I was unable to tunnel up the left side because of significant scarring and inflammation related to the contained rupture. The patient did well postoperatively returns for his first outpatient visit. She has no specific complaints and overall is doing well. She's been resuming her normal activities. She has no significant pain. Her bowels are working  Current Outpatient Prescriptions  Medication Sig Dispense Refill  . acetaminophen (TYLENOL) 325 MG tablet Take 1 tablet (325 mg total) by mouth every 4 (four) hours as needed for mild pain.    Marland Kitchen aspirin 81 MG tablet Take 81 mg by mouth daily.    Marland Kitchen loratadine (CLARITIN) 10 MG tablet Take 1 tablet (10 mg total) by mouth daily as needed for allergies.    Marland Kitchen losartan (COZAAR) 100 MG tablet Take 100 mg by mouth daily.  11  . methimazole (TAPAZOLE) 5 MG tablet Take 3 tablets (15 mg total) by mouth 3 (three) times daily. 270 tablet 5  . metoprolol succinate (TOPROL-XL) 50 MG 24 hr tablet Take 1 tablet (50 mg total) by mouth daily. 30 tablet 9  . Multiple Vitamin (MULTIVITAMIN WITH MINERALS) TABS tablet Take 1 tablet by mouth daily. 30 tablet 0  . pravastatin (PRAVACHOL) 40 MG tablet Take 1 tablet (40 mg total) by mouth daily. 30 tablet 10  . vitamin B-12 (CYANOCOBALAMIN) 1000 MCG tablet Take 1 tablet (1,000 mcg total) by mouth daily. 30 tablet 0  . risperiDONE (RISPERDAL) 0.5 MG tablet Take 1  tablet (0.5 mg total) by mouth at bedtime. (Patient not taking: Reported on 02/18/2015) 30 tablet 5   No current facility-administered medications for this visit.   REVIEW OF SYSTEMS: Valu.Nieves ] denotes positive finding; [  ] denotes negative finding  CARDIOVASCULAR:  [ ]  chest pain   [ ]  dyspnea on exertion    CONSTITUTIONAL:  [ ]  fever   [ ]  chills  PHYSICAL EXAM: Filed Vitals:   02/18/15 1422 02/18/15 1430 02/18/15 1434  BP: 154/94 163/108 154/80  Pulse: 83 83 81  Temp: 98.4 F (36.9 C)    TempSrc: Oral    Resp: 16    Height: 5\' 1"  (1.549 m)    Weight: 115 lb (52.164 kg)    SpO2: 99%     GENERAL: The patient is a well-nourished female, in no acute distress. The vital signs are documented above. CARDIOVASCULAR: There is a regular rate and rhythm. PULMONARY: There is good air exchange bilaterally without wheezing or rales. ABDOMEN: She has normal pitch bowel sounds. Her incisions are healing nicely. She has a palpable femorofemoral graft pulse. Her feet are warm and well-perfused.  MEDICAL ISSUES: STATUS POST OPEN REPAIR OF RUPTURED ABDOMINAL AORTIC ANEURYSM: The patient is doing well. He is gradually resuming her normal activities. I'll plan on seeing her back in 2 months. We'll then follow her femorofemoral graft on our protocol. I have discussed the importance of receiving prophylactic antivirals  for any invasive procedures in order to lower her risk of graft infection. She will need a VASCULAR QUALITY INITIATIVE follow up at 1 year.  HYPERTENSION: The patient's initial blood pressure today was elevated. We repeated this and this was still elevated. We have encouraged the patient to follow up with their primary care physician for management of their blood pressure.  Deitra Mayo [facility]: [phone]

## 2015-02-18 NOTE — Telephone Encounter (Signed)
Patients daughter requesting patient to be referred for Neurology. Patients daughter Kristi's call back number is [phone]

## 2015-02-19 ENCOUNTER — Telehealth: Payer: Self-pay

## 2015-02-19 NOTE — Telephone Encounter (Signed)
Pt's daughter called wanting to let Dr. Tamala Julian know that her mom went from sleeping 23 hours a day to now not sleeping at all since adjusting the the risperiDONE (RISPERDAL) 0.5 MG tablet [[id number]. She also reports she is more confused and wondering if we need to change her medication. Please advise.

## 2015-02-20 NOTE — Telephone Encounter (Signed)
Referral to neurology placed.  Please advise daughter.

## 2015-02-20 NOTE — Telephone Encounter (Signed)
Spoke with daughter/Clarisa Danser --- stopped Risperdal 0.25mg  qhs last week.  Patient locked daughter out of the house last night due to paranoia.  Lots going on in the house; having to work on plumbing; pt has become very upset about chaos within the home.  Now walking the house all day long.  Not sleeping.  Stopped Risperdal 3 days ago.  Had decreased Risperdal to 0.5mg  1/2 qhs two weeks prior.  A/P:  Paranoia:  Restart Risperdal 0.5mg  1/2 qhs while plumbing issues are being addressed at home. Then, can attempt to decrease to 1/2 qod.

## 2015-02-21 NOTE — Telephone Encounter (Signed)
Kristi notified 

## 2015-02-27 ENCOUNTER — Ambulatory Visit (INDEPENDENT_AMBULATORY_CARE_PROVIDER_SITE_OTHER): Payer: Medicare Other | Admitting: Family Medicine

## 2015-02-27 ENCOUNTER — Ambulatory Visit (INDEPENDENT_AMBULATORY_CARE_PROVIDER_SITE_OTHER): Payer: Medicare Other

## 2015-02-27 ENCOUNTER — Encounter: Payer: Self-pay | Admitting: Family Medicine

## 2015-02-27 ENCOUNTER — Telehealth: Payer: Self-pay | Admitting: *Deleted

## 2015-02-27 VITALS — BP 162/82 | HR 86 | Temp 98.4°F | Resp 16 | Ht 61.0 in | Wt 111.8 lb

## 2015-02-27 DIAGNOSIS — R059 Cough, unspecified: Secondary | ICD-10-CM

## 2015-02-27 DIAGNOSIS — R739 Hyperglycemia, unspecified: Secondary | ICD-10-CM

## 2015-02-27 DIAGNOSIS — R441 Visual hallucinations: Secondary | ICD-10-CM | POA: Diagnosis not present

## 2015-02-27 DIAGNOSIS — R05 Cough: Secondary | ICD-10-CM

## 2015-02-27 DIAGNOSIS — F05 Delirium due to known physiological condition: Secondary | ICD-10-CM | POA: Diagnosis not present

## 2015-02-27 DIAGNOSIS — R410 Disorientation, unspecified: Secondary | ICD-10-CM

## 2015-02-27 DIAGNOSIS — R634 Abnormal weight loss: Secondary | ICD-10-CM

## 2015-02-27 DIAGNOSIS — I718 Aortic aneurysm of unspecified site, ruptured: Secondary | ICD-10-CM | POA: Diagnosis not present

## 2015-02-27 DIAGNOSIS — E876 Hypokalemia: Secondary | ICD-10-CM

## 2015-02-27 LAB — CBC WITH DIFFERENTIAL/PLATELET
BASOS PCT: 0 % (ref 0–1)
Basophils Absolute: 0 10*3/uL (ref 0.0–0.1)
EOS ABS: 0.2 10*3/uL (ref 0.0–0.7)
Eosinophils Relative: 2 % (ref 0–5)
HCT: 43.2 % (ref 36.0–46.0)
Hemoglobin: 14.5 g/dL (ref 12.0–15.0)
Lymphocytes Relative: 12 % (ref 12–46)
Lymphs Abs: 1.1 10*3/uL (ref 0.7–4.0)
MCH: 28.1 pg (ref 26.0–34.0)
MCHC: 33.6 g/dL (ref 30.0–36.0)
MCV: 83.7 fL (ref 78.0–100.0)
MONOS PCT: 7 % (ref 3–12)
MPV: 11.4 fL (ref 8.6–12.4)
Monocytes Absolute: 0.6 10*3/uL (ref 0.1–1.0)
NEUTROS PCT: 79 % — AB (ref 43–77)
Neutro Abs: 7.1 10*3/uL (ref 1.7–7.7)
PLATELETS: 379 10*3/uL (ref 150–400)
RBC: 5.16 MIL/uL — ABNORMAL HIGH (ref 3.87–5.11)
RDW: 14.7 % (ref 11.5–15.5)
WBC: 9 10*3/uL (ref 4.0–10.5)

## 2015-02-27 LAB — COMPREHENSIVE METABOLIC PANEL
ALBUMIN: 3.7 g/dL (ref 3.6–5.1)
ALT: 9 U/L (ref 6–29)
AST: 16 U/L (ref 10–35)
Alkaline Phosphatase: 97 U/L (ref 33–130)
BILIRUBIN TOTAL: 0.6 mg/dL (ref 0.2–1.2)
BUN: 11 mg/dL (ref 7–25)
CO2: 32 mmol/L — AB (ref 20–31)
CREATININE: 1.01 mg/dL — AB (ref 0.60–0.93)
Calcium: 11.6 mg/dL — ABNORMAL HIGH (ref 8.6–10.4)
Chloride: 98 mmol/L (ref 98–110)
Glucose, Bld: 104 mg/dL — ABNORMAL HIGH (ref 65–99)
Potassium: 3.3 mmol/L — ABNORMAL LOW (ref 3.5–5.3)
SODIUM: 140 mmol/L (ref 135–146)
Total Protein: 7.1 g/dL (ref 6.1–8.1)

## 2015-02-27 LAB — POCT GLYCOSYLATED HEMOGLOBIN (HGB A1C): Hemoglobin A1C: 5

## 2015-02-27 NOTE — Patient Instructions (Signed)
1. Increase Risperdal to 1/2 tablet twice daily.

## 2015-02-27 NOTE — Progress Notes (Signed)
Subjective:    Patient ID: [name], female    DOB: [birth year], 80 y.o.   MRN: [id number]  02/27/2015  Follow-up and dilusions   HPI This 80 y.o. female presents for three week follow-up:  1.  Hypersomnolence:after last visit, stopped Risperdal.  Then delirium and hallucinations recurred. Restarted Risperdal on 02/20/15 qhs; now sleeping during the night. Now having bursts of energy (2 hours) and then will sleep a lot.  2.  Delirium and hallucinations: Has acutely worsened in past two days.  Herding children and people out of the house for the past two days. Moves really well and will not use walker during those times.  Having multiple bursts during the day.  1/2 tablet Risperdal qhs for past week.  Sleeping most of the night but not sure.  No nighttime bursts of energy anymore since starting back on 1/2 Risperdal qhs.  Talked about people being in the road on the way to office.  Visual hallucinations more than auditory hallucinations.  Saw blond lady at kitchen table.  A friend was helping her take down banners the other day; there was no one in the home with patient.  Appointment with neurology 03/16/15.  Caregiver every day 11:00am because pt sleeps in and stays until 5:00pm; daughter gets home at 6:00pm; caregiver has also witnessed pt with hallucinations.  Neighbors are around as well.  Plumbing issues now all fixed.  Now having AC troubles.  Having abdominal pressure but no pain.  Spatial issues; repetitive behaviors.  No fever/chills/sweats.  S/p MRI in 10/2014 with acute change in mental status without acute changes.  +cough frequent and has been ongoing for several weeks; no cigarettes since 12/2014.      Upon further discussion with daughter, visual hallucinations have actually been occurring for 5+ years.  Daughter never acted upon them initially because patient was functional; she was dressing self, caring for the home appropriately, driving well.  Pt has ruined relationships with  living siblings; she has accused family members of stealing money and things from her home.  Siblings now have no relationship with patient.  Daughter denies mental illness in patient's family, but does suspect that pt suffered depression after death of husband followed death of mother two years later; these deaths occurred ten years ago.  Daughter was always suspicious of depression associated psychoses.  However, as mood and depression improved, the hallucinations persisted.  Then daughter really was hopeful that he VP shunt placement for hydrocephaly in May 2015 would help the hallucinations; however, it did not. Delirium and hallucinations acutely worsened in May 2015.  The patient's functional status then started declining for the first time. No previous psychiatric or neurological evaluation performed for delirium or hallucinations in the past.  3. AAA rupture: s/p follow-up with surgeon; vascular surgery pleased.  [facility] was to be sent out; they were to come out last weekend; never rescheduled note.  Occupational Therapist has come; PT has never come; nursing has not come; told no one could come until RN could come.    4. HTN: blood pressure elevated at vascular surgery office.  Blood pressure elevated today some; pt seems to be very anxious with visual hallucinations.    5. Diarrhea: improved; messy experiences but not runny or frequent; occurring every other day; no bloody stools or melena.  Urinary incontinence this week; more at nighttime.  One episode was daytime as well.    6. Hyperthyroidism: appointment 03-23-15 with endocrinology at 2:00pm.  TSH  at last visit WNL.  Patient has lost 5 pounds in past week and 16 pounds in past month.  Daughter reports that patient is eating well for her at night and for caregiver.  No further diarrhea.   Review of Systems  Constitutional: Negative for fever, chills, diaphoresis and fatigue.  HENT: Negative for drooling, postnasal drip,  rhinorrhea and sore throat.   Eyes: Negative for visual disturbance.  Respiratory: Positive for cough. Negative for shortness of breath.   Cardiovascular: Negative for chest pain, palpitations and leg swelling.  Gastrointestinal: Negative for nausea, vomiting, abdominal pain, diarrhea, constipation, blood in stool, abdominal distention and anal bleeding.  Endocrine: Negative for cold intolerance, heat intolerance, polydipsia, polyphagia and polyuria.  Skin: Negative for rash.  Neurological: Negative for dizziness, tremors, seizures, syncope, facial asymmetry, speech difficulty, weakness, light-headedness, numbness and headaches.  Psychiatric/Behavioral: Positive for hallucinations, confusion, dysphoric mood and agitation. Negative for suicidal ideas, sleep disturbance and self-injury. The patient is nervous/anxious. The patient is not hyperactive.     Past Medical History  Diagnosis Date  . Hyperlipidemia   . Essential hypertension, benign   . CAD (coronary artery disease)   . Osteoporosis   . Hypercalcemia   . Vitamin D deficiency   . Ovarian tumor   . Breast tumor   . MI (myocardial infarction) July 1995    Treated with LAD PCI- Dr Adora Fridge  . Cataract   . Complication of anesthesia   . PONV (postoperative nausea and vomiting)   . Lung tumor     recently found approx 25 days Simone Curia, MD   Past Surgical History  Procedure Laterality Date  . Angioplasty    . Hip pinning    . Breast lumpectomy    . Abdominal hysterectomy    . Cholecystectomy    . Fracture surgery  Nov 2003    L distal radius fx- Dr. Frederik Pear  . Eye surgery      Cataract removal  . Cardiac catheterization      1990's  . Ventriculoperitoneal shunt Right 12/20/2013    Procedure: SHUNT INSERTION VENTRICULAR-PERITONEAL;  Surgeon: Winfield Cunas, MD;  Location: [facility];  Service: Neurosurgery;  Laterality: Right;  VP shunt insertion  . Doppler echocardiography  2010  . Abdominal aortic aneurysm  repair N/A 01/27/2015    Procedure: Resection and Grafting Ruptured Abdominal Aortic Aneurysm with Aorta-Right Femoral, Left Common Iliac Graft;  Surgeon: Angelia Mould, MD;  Location: [facility];  Service: Vascular;  Laterality: N/A;  . Lysis of adhesion N/A 01/27/2015    Procedure: LYSIS OF ADHESIONS;  Surgeon: Angelia Mould, MD;  Location: [facility];  Service: Vascular;  Laterality: N/A;  . Femoral-femoral bypass graft N/A 01/27/2015    Procedure: BYPASS GRAFT FEMORAL-FEMORAL ARTERY;  Surgeon: Angelia Mould, MD;  [facility];  Service: Vascular;  Laterality: N/A;   Allergies  Allergen Reactions  . Sulfa Antibiotics Swelling  . Codeine Nausea And Vomiting  . Hydrocodone Other (See Comments)    Confusion  . Latex Itching    Patient states it makes her 'feel itchy'  . Other Nausea And Vomiting    Some type of pain pill given for allergic reaction   . Tape Other (See Comments)    Causes skin tears, please use "paper" tape only.   Current Outpatient Prescriptions  Medication Sig Dispense Refill  . acetaminophen (TYLENOL) 325 MG tablet Take 1 tablet (325 mg total) by mouth every 4 (four) hours as needed  for mild pain.    Marland Kitchen aspirin 81 MG tablet Take 81 mg by mouth daily.    Marland Kitchen loratadine (CLARITIN) 10 MG tablet Take 1 tablet (10 mg total) by mouth daily as needed for allergies.    Marland Kitchen losartan (COZAAR) 100 MG tablet Take 100 mg by mouth daily.  11  . methimazole (TAPAZOLE) 5 MG tablet Take 3 tablets (15 mg total) by mouth 3 (three) times daily. 270 tablet 5  . metoprolol succinate (TOPROL-XL) 50 MG 24 hr tablet Take 1 tablet (50 mg total) by mouth daily. 30 tablet 9  . Multiple Vitamin (MULTIVITAMIN WITH MINERALS) TABS tablet Take 1 tablet by mouth daily. 30 tablet 0  . pravastatin (PRAVACHOL) 40 MG tablet Take 1 tablet (40 mg total) by mouth daily. 30 tablet 10  . risperiDONE (RISPERDAL) 0.5 MG tablet Take 1 tablet (0.5 mg total) by mouth at bedtime. 30 tablet 5  . vitamin B-12  (CYANOCOBALAMIN) 1000 MCG tablet Take 1 tablet (1,000 mcg total) by mouth daily. 30 tablet 0   No current facility-administered medications for this visit.   Social History   Social History  . Marital Status: Widowed    Spouse Name: N/A  . Number of Children: 1  . Years of Education: N/A   Occupational History  . retired    Social History Main Topics  . Smoking status: Former Smoker -- 1.00 packs/day for 60 years    Types: Cigarettes    Quit date: 12/19/2014  . Smokeless tobacco: Never Used  . Alcohol Use: No  . Drug Use: No  . Sexual Activity: No   Other Topics Concern  . Not on file   Social History Narrative   Marital status: widowed.      Children: one daughter [name] [phone])      Employment: retired; Master's degree.      Lives: alone.      Tobacco: daily smoking.   Family History  Problem Relation Age of Onset  . Stroke Mother   . Heart disease Father   . Cancer Sister        Objective:    BP 162/82 mmHg  Pulse 86  Temp(Src) 98.4 F (36.9 C) (Oral)  Resp 16  Ht 5\' 1"  (1.549 m)  Wt 111 lb 12.8 oz (50.712 kg)  BMI 21.14 kg/m2 Physical Exam  Constitutional: She is oriented to person, place, and time. She appears well-developed and well-nourished. No distress.  Not hypersomnolent today; however, not engaged in conversation.  HENT:  Head: Normocephalic and atraumatic.  Right Ear: External ear normal.  Left Ear: External ear normal.  Nose: Nose normal.  Mouth/Throat: Oropharynx is clear and moist.  Eyes: Conjunctivae and EOM are normal. Pupils are equal, round, and reactive to light.  Neck: Normal range of motion. Neck supple. Carotid bruit is not present. No thyromegaly present.  Cardiovascular: Normal rate, regular rhythm, normal heart sounds and intact distal pulses.  Exam reveals no gallop and no friction rub.   No murmur heard. Pulmonary/Chest: Effort normal and breath sounds normal. She has no wheezes. She has no rales.  Abdominal: Soft.  Bowel sounds are normal. She exhibits no distension and no mass. There is no tenderness. There is no rebound and no guarding.  Well healed midline incision.  Abdomen much less distended today.  Lymphadenopathy:    She has no cervical adenopathy.  Neurological: She is alert and oriented to person, place, and time. No cranial nerve deficit.  Skin: Skin is warm and  dry. No rash noted. She is not diaphoretic. No erythema. No pallor.  Psychiatric: She has a normal mood and affect. Her speech is normal. She is withdrawn. She is not agitated. She does not express impulsivity. She exhibits abnormal recent memory.  2016 correct.  September correct.  Labor Day weekend correct.  Unaware of building or location or type of facility she is located; Unaware of MD's name or title; identifies MD as a friend/acquaintence.   She is inattentive.    [facility] reading (PRIMARY) by  Dr. Tamala Julian. CXR: NAD      Assessment & Plan:   1. Visual hallucinations   2. Subacute delirium   3. Hyperglycemia   4. Hypokalemia   5. Aortic aneurysm, ruptured   6. Cough   7. Weight loss, unintentional    1. Visual hallucinations: Recurrent; after lengthy discussion with daughter, hallucinations have been ongoing for 5 years with abrupt worsening in May 2015.  Increase Rispderdal to 0.5mg  1/2 bid. Appointment with neurology on 03/16/15. Has caregiver daily; daughter is also moving into the home this weekend.  2.  Subacute delirium: recurrent/worsening; increase Rispderal 0.5mg  1/2 bid; appointment with neurology in two weeks; s/p MRI in 10/2014 without acute changes.  TSH normal this month.  CXR without evidence of pneumonia.  Recent hospitalization for AAA rupture; recent surgery; several triggers to delirium. 3.  Hyperglycemia:  New during admission; normal HgbA1c of 5.0. 4.  History of recent AAA rupture: stable; s/p surgical repair.  To touch base with [facility] care services. 5.  Cough: Chronic; s/p CXR; no evidence of pneumonia.   6.   Weight loss unintentional: New; onset in past month; significant weight loss of 16 pounds; obtain labs.  Daughter reports that pt is eating well.  Monitor closely.  Some of weight loss may be due to loss of fluid retention from recent admission for AAA repair. If so, expect weight loss to slow down at next visit.  Prolonged face to face with 60 minutes with 50% of time dedicated to chart review, counseling, and coordination of care.   Orders Placed This Encounter  Procedures  . DG Chest 2 View    Standing Status: Future     Number of Occurrences: 1     Standing Expiration Date: 02/27/2016    Order Specific Question:  Reason for Exam (SYMPTOM  OR DIAGNOSIS REQUIRED)    Answer:  cough; recent AAA repair; delirium    Order Specific Question:  Preferred imaging location?    Answer:  External  . CBC with Differential/Platelet  . Comprehensive metabolic panel  . POCT glycosylated hemoglobin (Hb A1C)   No orders of the defined types were placed in this encounter.    Return in about 4 weeks (around 2015-04-08) for recheck.   Neilan Rizzo Elayne Guerin, M.D. [facility] [address] [place], [place]  [address] [phone] phone [phone] fax

## 2015-02-27 NOTE — Telephone Encounter (Signed)
Daughter wants you to know before appt that her delusions are worse.  She thinks there are 50 people in the house and she tries to get them out and she is not using walker.

## 2015-02-28 ENCOUNTER — Encounter: Payer: Self-pay | Admitting: Family Medicine

## 2015-02-28 NOTE — Telephone Encounter (Signed)
Noted and discussed further at 02/27/15 visit.

## 2015-03-02 ENCOUNTER — Telehealth: Payer: Self-pay | Admitting: Family Medicine

## 2015-03-02 MED ORDER — CIPROFLOXACIN HCL 250 MG PO TABS: 250.0000 mg | ORAL_TABLET | Freq: Two times a day (BID) | ORAL | Status: AC

## 2015-03-02 MED ORDER — CIPROFLOXACIN HCL 250 MG PO TABS: 250.0000 mg | ORAL_TABLET | Freq: Two times a day (BID) | ORAL | Status: DC

## 2015-03-02 NOTE — Telephone Encounter (Signed)
Spoke with daughter/Analiyah Lechuga ---- patient has become more and more confused since visit this week.  Worsening urinary incontinence.  A/P: delirium with visual hallucinations and urinary incontinence:  Worsening; will empirically treat with Cipro for UTI since she has suffered with UTI with worsening mental status.  Daughter agreeable. To ED for harm to self or to others.  Also can increase Risperdal 0.5mg  to one tablet qhs if starts staying up at night.

## 2015-03-02 NOTE — Telephone Encounter (Signed)
cipro did not go through as [facility] was closed cause of the labor day holiday. Pt's daughter waiting at [facility] [address] so resent and called.

## 2015-03-06 NOTE — Telephone Encounter (Signed)
Pt daughter called stating that her mother is still having trouble walking and is still confused  Best number [phone]

## 2015-03-08 ENCOUNTER — Telehealth: Payer: Self-pay | Admitting: Radiology

## 2015-03-08 DIAGNOSIS — R410 Disorientation, unspecified: Secondary | ICD-10-CM

## 2015-03-08 DIAGNOSIS — F05 Delirium due to known physiological condition: Principal | ICD-10-CM

## 2015-03-08 NOTE — Telephone Encounter (Signed)
I do not know this pt - this was just an answering service call. If she is having trouble walking and the empiric trx for a UTI has not helped, she needs to go to the ER - may need head imaging, will def need stat labs to work up the deleirium

## 2015-03-08 NOTE — Telephone Encounter (Signed)
Please advise/ see message below, do you want her to return to clinic, or ER , or other ?

## 2015-03-09 NOTE — Telephone Encounter (Signed)
Hey Dr. Tamala Julian, [name] daughter [name] called if you see previous messages she wanted to update you on her mother's condition. She states that you asked her to keep her updated with her mother's condition.

## 2015-03-09 NOTE — Telephone Encounter (Signed)
Patient's daughter states that her mother is having trouble getting out of bed, trouble walking, speaking, etc.    [name] (daughter) [phone]

## 2015-03-10 MED ORDER — RISPERIDONE 0.25 MG PO TABS: 0.2500 mg | ORAL_TABLET | Freq: Two times a day (BID) | ORAL | Status: AC

## 2015-03-10 NOTE — Telephone Encounter (Signed)
Spoke with daughter --- pt not sitting up for more than 5 minutes.  Significant decline since 02/27/15.  Four days ago, stayed in bed all day.  Has been bathing in bed.  Now unable to get pt down the steps.  Still has good verbal communications/skills.  Less talkative.  Still hallucinating but delirium and complete confusion has improved with increase of Risperdal of 0.5mg  1/2 bid.  Has appointment with neurology coming up; will need to hire someone to get her down the steps.  No focal weakness; no slurred speech.  Has become really rigid; has a death grip on the walker; will not bend her knees with using the walker.  Will start shaking due to rigid hold on walker.  Not sleeping; pt is awake but is staring off into [place]; will speak when being spoken to; seems to be focused on whatever is going on in her mind.  Thinks this is a mobility issue.  Has been gradually getting more and more uncomfortable with ambulation.  Not eating a lot (grits, eggs, applesauce, mini quiche).  Taking medication (all medications).  Appointment with neurology in six days.  Holding food in mouth a lot.  A/P: Chronic hallucinations with intermittent delirium: decrease Ripserdal to 0.25mg  1/2 every morning and one at bedtime.  Hospice referral placed for support.  Neurology appointment in upcoming week.

## 2015-03-10 NOTE — Telephone Encounter (Signed)
Spoke with daughter at length.

## 2015-03-11 ENCOUNTER — Telehealth: Payer: Self-pay

## 2015-03-11 NOTE — Telephone Encounter (Signed)
Dr. Tamala Julian has reached out to daughter, Dr. Tamala Julian made a note on 03/10/2015.

## 2015-03-11 NOTE — Telephone Encounter (Signed)
Hospice called to ask for VO for one of their nurses to take a DNR form out to pt to sign. Pt is aware and in agreement with signing this form. Gave VO for them to discuss and have pt sign if this is her wishes. Dr Tamala Julian, [facility]ier

## 2015-03-14 ENCOUNTER — Telehealth: Payer: Self-pay

## 2015-03-14 NOTE — Telephone Encounter (Signed)
Kristi the patient's daughter called in to leave a message for Dr. Tamala Julian. She wants her to know that they have a hospital bed for [facility] now and she also wants to express that patient is still not eating much solid foods but she is drinking several Ensure's a day and that's a plus. The talking nonsense has increased a lot so they've reduced her risperiDONE (RISPERDAL) 0.25 MG tablet [[id number]] She's not aggressive or anything but she just wanted to make Dr. Tamala Julian aware of the changes.

## 2015-03-16 ENCOUNTER — Telehealth: Payer: Self-pay

## 2015-03-16 ENCOUNTER — Ambulatory Visit (INDEPENDENT_AMBULATORY_CARE_PROVIDER_SITE_OTHER): Admitting: Neurology

## 2015-03-16 ENCOUNTER — Ambulatory Visit
Admission: RE | Admit: 2015-03-16 | Discharge: 2015-03-16 | Disposition: A | Discharge status: Home / Self Care | Payer: Medicare Other | Source: Ambulatory Visit | Attending: Neurology | Admitting: Neurology

## 2015-03-16 VITALS — BP 159/89 | HR 82 | Temp 97.0°F | Ht 61.0 in

## 2015-03-16 DIAGNOSIS — R4189 Other symptoms and signs involving cognitive functions and awareness: Secondary | ICD-10-CM | POA: Diagnosis not present

## 2015-03-16 NOTE — Telephone Encounter (Signed)
Met with Dr. Coral Ceo today she was great and they are very happy to have been referred to her. Also she wanted to let Dr. Tamala Julian know that they ordered a CT scan and she wanted her to look at the results when they come in. She just wanted to keep Dr. Tamala Julian informed.

## 2015-03-16 NOTE — Progress Notes (Signed)
[facility]    Provider:  Dr Jaynee Eagles Referring Provider: Wardell Honour, MD Primary Care Physician:  Reginia Forts, MD  CC:  Visual hallucinations  HPI:  [name] is a 80 y.o. female here as a referral from Dr. Tamala Julian for visual hallucinations. She has a history of both auditory and visual hallucinations, psychosis, dementia,.   80 year old female with a complicated medical history including memory loss with a significant decline over the last year, lung tumor of possible neoplastic etiology but the patient has refused further workup, shunt for hydrocephalus due to posterior fossa tumor, UTIs, AKI, aortic aneurysm, CAD, HTN,HLD, Hypercalcemia, Ovarian tumor, MI,  Dehydration, falls, 60-pack-year smoking history, normal previous EEGs, multiple hospitalizations as far back as 10/2013 for encephalopathy. She was diagnosed with psychotic disorder NOS by inpatient psychiatry in 2015 with delusions, hallucinations, auditory and paranoid ideation, heard voices in her home and attic and felt people were stealing from her, memory loss. Notes also stated an episode of psychosis in 2012.   Patient had baseline dementia but was still functional as of earlier this year. Patient had a ruptured aortic aneurysm and a fall back in June. Multiple hospitalizations and rehab. She was using a walker 2-3 weeks ago and has had a significant decline. Significant decline physically and mentally. Up until June she was doing some of her own finances. She had gone to get the mail in June and she had fallen. Mri was completed in June, she was paying her own bills, living independently. She went into rehab, she was takative and [employer] [facility]. [facility]. Several years previous thought people were living in the home. She has had extensive testing. PET scan was done in August. Significant decline since July. In August her cognition was still better tha now, she could hold conversations, she  could remember things. She had surgery for the aortic aneurysm, she was agitated, was difficult, dramatic decrease in the last month. Dramatic loss of appetite.  Daughter provides all information. She has been independent. Since Augst there has been a signifcant decline, she is not walking anymore, she is not conversant, she is frequently unaware of surroundings, thought she was at the beach recently. Has decreased the Risperdal (on for hallucinations), more delusions such as having conversations with people who aren't there, she has fights with people. She knows her daughter but has on occasion called her daughetr by another name, knew some friends this weekend, waxes and wanes. She sleeps a lot, can't wake her up.   Reviewed notes, labs and imaging from outside physicians, which showed: Patient was recently admitted in June 2016 after her daughter found her in the front yard very confused and partially clothed. Per notes, patient has had a significant decline over the past year and had been placed on risperidone. She was admitted for acute kidney injury and encephalopathy, hypercalcemia and other electrolyte abnormalities. B12 and RPR were checked, TSH showed hyperthyroidism and she was started on methimazole. She has had  multiple hospitalizations as far back as 10/2013 for encephalopathy. She was diagnosed with psychotic disorder NOS by inpatient psychiatry in 2015 with delusions, hallucinations, auditory and paranoid ideation, heard voices in her home and attic and felt people were stealing from her, memory loss. Notes also stated an episode of psychosis in 2012. In June of 2015 Dr, Cyndy Freeze placed a ventriculoperitoneal shunt.   CT of the head performed same day as appointment. Personally reviwed images and agree with following:  IMPRESSION: This is an abnormal CT scan of the head without contrast showing the following: 1. Calcified mass within the inferior fourth ventricle, unchanged when compared to  the CT scan dated 01/07/2015. 2. Stable moderate hydrocephalus. A right frontal ventriculostomy is in place with the tip located in the left lateral ventricle 3. Stable moderately severe white matter changes in both hemispheres consistent with chronic small vessel ischemic disease and stable bilateral lacunar infarctions in the thalamus. Mild to moderate cortical atrophy is noted. 4. There are no acute findings.     Review of Systems: Patient complains of symptoms per HPI as well as the following symptoms: No chest pain or shortness of breath, no fever. Pertinent negatives per HPI. All others negative.   Social History   Social History  . Marital Status: Widowed    Spouse Name: N/A  . Number of Children: 1  . Years of Education: N/A   Occupational History  . retired    Social History Main Topics  . Smoking status: Former Smoker -- 1.00 packs/day for 60 years    Types: Cigarettes    Quit date: 12/19/2014  . Smokeless tobacco: Never Used  . Alcohol Use: No  . Drug Use: No  . Sexual Activity: No   Other Topics Concern  . Not on file   Social History Narrative   Marital status: widowed.      Children: one daughter [name] [phone])      Employment: retired; Master's degree.      Lives: alone.      Tobacco: daily smoking.    Family History  Problem Relation Age of Onset  . Stroke Mother   . Heart disease Father   . Cancer Sister     Past Medical History  Diagnosis Date  . Hyperlipidemia   . Essential hypertension, benign   . CAD (coronary artery disease)   . Osteoporosis   . Hypercalcemia   . Vitamin D deficiency   . Ovarian tumor   . Breast tumor   . MI (myocardial infarction) July 1995    Treated with LAD PCI- Dr Adora Fridge  . Cataract   . Complication of anesthesia   . PONV (postoperative nausea and vomiting)   . Lung tumor     recently found approx 25 days Simone Curia, MD    Past Surgical History  Procedure Laterality Date  .  Angioplasty    . Hip pinning    . Breast lumpectomy    . Abdominal hysterectomy    . Cholecystectomy    . Fracture surgery  Nov 2003    L distal radius fx- Dr. Frederik Pear  . Eye surgery      Cataract removal  . Cardiac catheterization      1990's  . Ventriculoperitoneal shunt Right 12/20/2013    Procedure: SHUNT INSERTION VENTRICULAR-PERITONEAL;  Surgeon: Winfield Cunas, MD;  Location: [facility];  Service: Neurosurgery;  Laterality: Right;  VP shunt insertion  . Doppler echocardiography  2010  . Abdominal aortic aneurysm repair N/A 01/27/2015    Procedure: Resection and Grafting Ruptured Abdominal Aortic Aneurysm with Aorta-Right Femoral, Left Common Iliac Graft;  Surgeon: Angelia Mould, MD;  Location: [place];  Service: Vascular;  Laterality: N/A;  . Lysis of adhesion N/A 01/27/2015    Procedure: LYSIS OF ADHESIONS;  Surgeon: Angelia Mould, MD;  Location: [facility];  Service: Vascular;  Laterality: N/A;  . Femoral-femoral bypass graft N/A 01/27/2015    Procedure: BYPASS GRAFT FEMORAL-FEMORAL  ARTERY;  Surgeon: Angelia Mould, MD;  Location: [facility] OR;  Service: Vascular;  Laterality: N/A;    Current Outpatient Prescriptions  Medication Sig Dispense Refill  . acetaminophen (TYLENOL) 325 MG tablet Take 1 tablet (325 mg total) by mouth every 4 (four) hours as needed for mild pain.    Marland Kitchen aspirin 81 MG tablet Take 81 mg by mouth daily.    . ciprofloxacin (CIPRO) 250 MG tablet Take 1 tablet (250 mg total) by mouth 2 (two) times daily. 14 tablet 0  . loratadine (CLARITIN) 10 MG tablet Take 1 tablet (10 mg total) by mouth daily as needed for allergies.    Marland Kitchen losartan (COZAAR) 100 MG tablet Take 100 mg by mouth daily.  11  . methimazole (TAPAZOLE) 5 MG tablet Take 3 tablets (15 mg total) by mouth 3 (three) times daily. 270 tablet 5  . metoprolol succinate (TOPROL-XL) 50 MG 24 hr tablet Take 1 tablet (50 mg total) by mouth daily. 30 tablet 9  . Multiple Vitamin (MULTIVITAMIN WITH  MINERALS) TABS tablet Take 1 tablet by mouth daily. 30 tablet 0  . pravastatin (PRAVACHOL) 40 MG tablet Take 1 tablet (40 mg total) by mouth daily. 30 tablet 10  . risperiDONE (RISPERDAL) 0.25 MG tablet Take 1 tablet (0.25 mg total) by mouth 2 (two) times daily. 60 tablet 5  . vitamin B-12 (CYANOCOBALAMIN) 1000 MCG tablet Take 1 tablet (1,000 mcg total) by mouth daily. 30 tablet 0   No current facility-administered medications for this visit.    Allergies as of 03/16/2015 - Review Complete 02/27/2015  Allergen Reaction Noted  . Sulfa antibiotics Swelling 01/13/2012  . Codeine Nausea And Vomiting 01/13/2012  . Hydrocodone Other (See Comments) 01/08/2015  . Latex Itching 01/27/2015  . Other Nausea And Vomiting 11/21/2013  . Tape Other (See Comments) 01/28/2015    Vitals: There were no vitals taken for this visit. Last Weight:  Wt Readings from Last 1 Encounters:  02/27/15 111 lb 12.8 oz (50.712 kg)   Last Height:   Ht Readings from Last 1 Encounters:  02/27/15 5\' 1"  (1.549 m)    Physical exam: Exam: Gen: NAD,  Minimally responsive                    CV: RRR, no MRG. No Carotid Bruits. No peripheral edema, warm, nontender Eyes: Conjunctivae clear without exudates or hemorrhage  Neuro: Detailed Neurologic Exam  Speech: no aphasia or dysarthria Cognition: Follows simple commands inconsistently. Unable to perform MoCA due to cognitiion, attempted.     The patient is not oriented    recent and remote memory impaired;        Impaired attention, concentration,     Impaired fund of knowledge     Cranial Nerves:    The pupils are equal, round, and reactive to light. attemted fundoscopic exam but could not visualize. Visual fields are full to threat . Impaired upgaze. Trigeminal sensation is intact and the muscles of mastication are normal. The face is symmetric. The palate elevates in the midline. Hearing appears intact to voice, difficult to ascertain doesn't follow commands.  Voice is normal. Shoulder shrug is normal. The tongue has normal motion without fasciculations.   Coordination:    No apparent dysmetria, attempted but not following commands  Gait:    In wheelchair, attemped but can't bear weight  Motor Observation:    No asymmetry, no atrophy, and no involuntary movements noted. Tone:    Normal muscle tone.  Posture:    Posture slumped din wheelchair     Strength:    Strength appears symmetric bilaterally, difficult exam due to cognition     Sensation: withdraws x 4     Reflex Exam:  DTR's:    Deep tendon reflexes in the upper and lower extremities are symmetrical bilaterally.   Toes:    The toes are downgoing bilaterally.   Clonus:    Clonus is absent.      Assessment/Plan:  80 year old with a very complicated history of multiple medical problems, dementia, psychiatric disorder (auditory and visual hallucinations, paranoia, Dxed psychotic disorder). Here for worsening cognitive dysfunction in the setting of multiple medical problems. She has been hospitalized multiple times. Repeat CT of the head today was stable.  She likely has underlying significant dementia that is being worsened by multiple other medical/metabolic/toxic and psychiatric diagnoses. Etiology for her encephalopathy is likely multifactorial.I had a long discussion with daughter that I cannot tell her if her mother will regain any of her previous functionality.  Will continue to follow. Advised that she should continue to follow with her pcp for surveillance of metabolic/toxic causes of her encephalopathy.      Sarina Ill, MD  [facility] [address] [place], [place] [address]  Phone [phone] Fax [phone]

## 2015-03-16 NOTE — Telephone Encounter (Signed)
Message from Pt's daughter. Please advise.

## 2015-03-17 ENCOUNTER — Telehealth: Payer: Self-pay | Admitting: *Deleted

## 2015-03-17 NOTE — Telephone Encounter (Signed)
Called and spoke with Kristi, pt POA who is listed on DPR. Advised her per Dr. Jaynee Eagles that CT head was stable, nothing new to account for her mental status changes. Advised that Dr. Jaynee Eagles is out of the office today due to emergency, but she will call her tomorrow. Kristi verbalized understanding.

## 2015-03-17 NOTE — Telephone Encounter (Signed)
-----   Message from Melvenia Beam, MD sent at 03/17/2015 11:35 AM EDT ----- [name] - let patient's daughter know there was nothing acute on the CT of her head, it was stable. Nothing new to account for her mental status changes. Let her know that I am out of the office today due to an emergency but will call her whem I get back tomorrow. thanks

## 2015-03-17 NOTE — Telephone Encounter (Signed)
Noted.  CT results reviewed.

## 2015-03-18 ENCOUNTER — Telehealth: Payer: Self-pay

## 2015-03-18 ENCOUNTER — Encounter: Payer: Self-pay | Admitting: Internal Medicine

## 2015-03-18 ENCOUNTER — Other Ambulatory Visit: Payer: Self-pay | Admitting: Neurology

## 2015-03-18 MED ORDER — RISPERIDONE 1 MG/ML PO SOLN: 0.3000 mg | Freq: Two times a day (BID) | ORAL | Status: AC

## 2015-03-18 NOTE — Telephone Encounter (Signed)
Spoke to daughter. I advised she follow up with pcp to ensure mother does not have a UTI or other metabolic/toxic cause for her increasing confusion. I offered an EEG, daughter says she can't get to the office and she has had multiple eegs when confused and there are no seizures. Discussed hospice. She will call back if we are needed.
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
## 2015-03-18 NOTE — Telephone Encounter (Signed)
Pt's daughter Alyse Low called and would like Dr Tamala Julian to give her a call concerning her mother's medication. She has become combative with her and the caregivers. The caregivers have told the daughter that they will not be able to continue care if this continues. She can be reached @ [phone]. Thank you

## 2015-03-19 ENCOUNTER — Telehealth: Payer: Self-pay

## 2015-03-19 NOTE — Telephone Encounter (Signed)
Pt daughter called and wanted to let dr Tamala Julian know that she was not able to get patient to take her meds

## 2015-03-19 NOTE — Telephone Encounter (Signed)
Noted. Spoke with daughter on 03/18/15 for further update.  Increased Risperdal to 0.5mg  qhs and 0.25mg  every am.  Proceed with hospice.

## 2015-03-21 NOTE — Telephone Encounter (Signed)
Noted.  Please call daughter/Aldon Hengst---how is mom doing/behaving?

## 2015-03-21 NOTE — Telephone Encounter (Signed)
Patient's daughter states that she was able to get her mom to take some Risperdal.

## 2015-03-23 ENCOUNTER — Ambulatory Visit: Payer: Self-pay | Admitting: Internal Medicine

## 2015-03-23 NOTE — Telephone Encounter (Signed)
Spoke with pt, advised her that her mom was calmed down with the Risperdal and that she stopped drinking all together. She stopped talking all together. Daughter seemed upset on the phone. FYI

## 2015-03-25 ENCOUNTER — Encounter: Payer: Self-pay | Admitting: Neurology

## 2015-03-27 ENCOUNTER — Telehealth: Payer: Self-pay

## 2015-03-28 NOTE — Telephone Encounter (Signed)
Hospice called this morning and states that the patient passed away at 9:15am on 03/30/2015.

## 2015-03-28 NOTE — Telephone Encounter (Signed)
Spoke with daughter; Risperdal liquid was greatly helpful.  Daughter concerned about CVA/TIAs versus seizures?  Stopped drinking one week ago; too week to use a straw.  Moved to [employer]; daughter does not think that pt aware of move to [facility].  Administering liquid morphine and ativan.  Was very peaceful and calm.  Feet were cooler and then got a really high fever this morning; Hospice warned daughter and she stayed.

## 2015-03-28 NOTE — Telephone Encounter (Signed)
[facility] called to confirm Dr. Tamala Julian will be receiving the Death Certificate.

## 2015-03-28 DEATH — deceased

## 2015-03-30 ENCOUNTER — Encounter: Payer: Self-pay | Admitting: Family Medicine

## 2015-04-01 NOTE — Telephone Encounter (Signed)
Signed on 03/30/15.

## 2015-04-06 ENCOUNTER — Ambulatory Visit: Payer: Self-pay | Admitting: Family Medicine

## 2015-05-20 ENCOUNTER — Ambulatory Visit: Payer: Self-pay | Admitting: Vascular Surgery

## 2016-07-28 ENCOUNTER — Telehealth: Payer: Self-pay

## 2016-10-13 IMAGING — CR DG CHEST 2V
2 series · 2 of 2 positions shown · non-contrast
Comparison: CT 11/21/2013.  Radiographs 11/20/2013.

CLINICAL DATA: Confusion for 2 weeks, worse today. Unsteady gait.
Hypertension. Initial encounter.

EXAM:
CHEST  2 VIEW

[w chest lat]
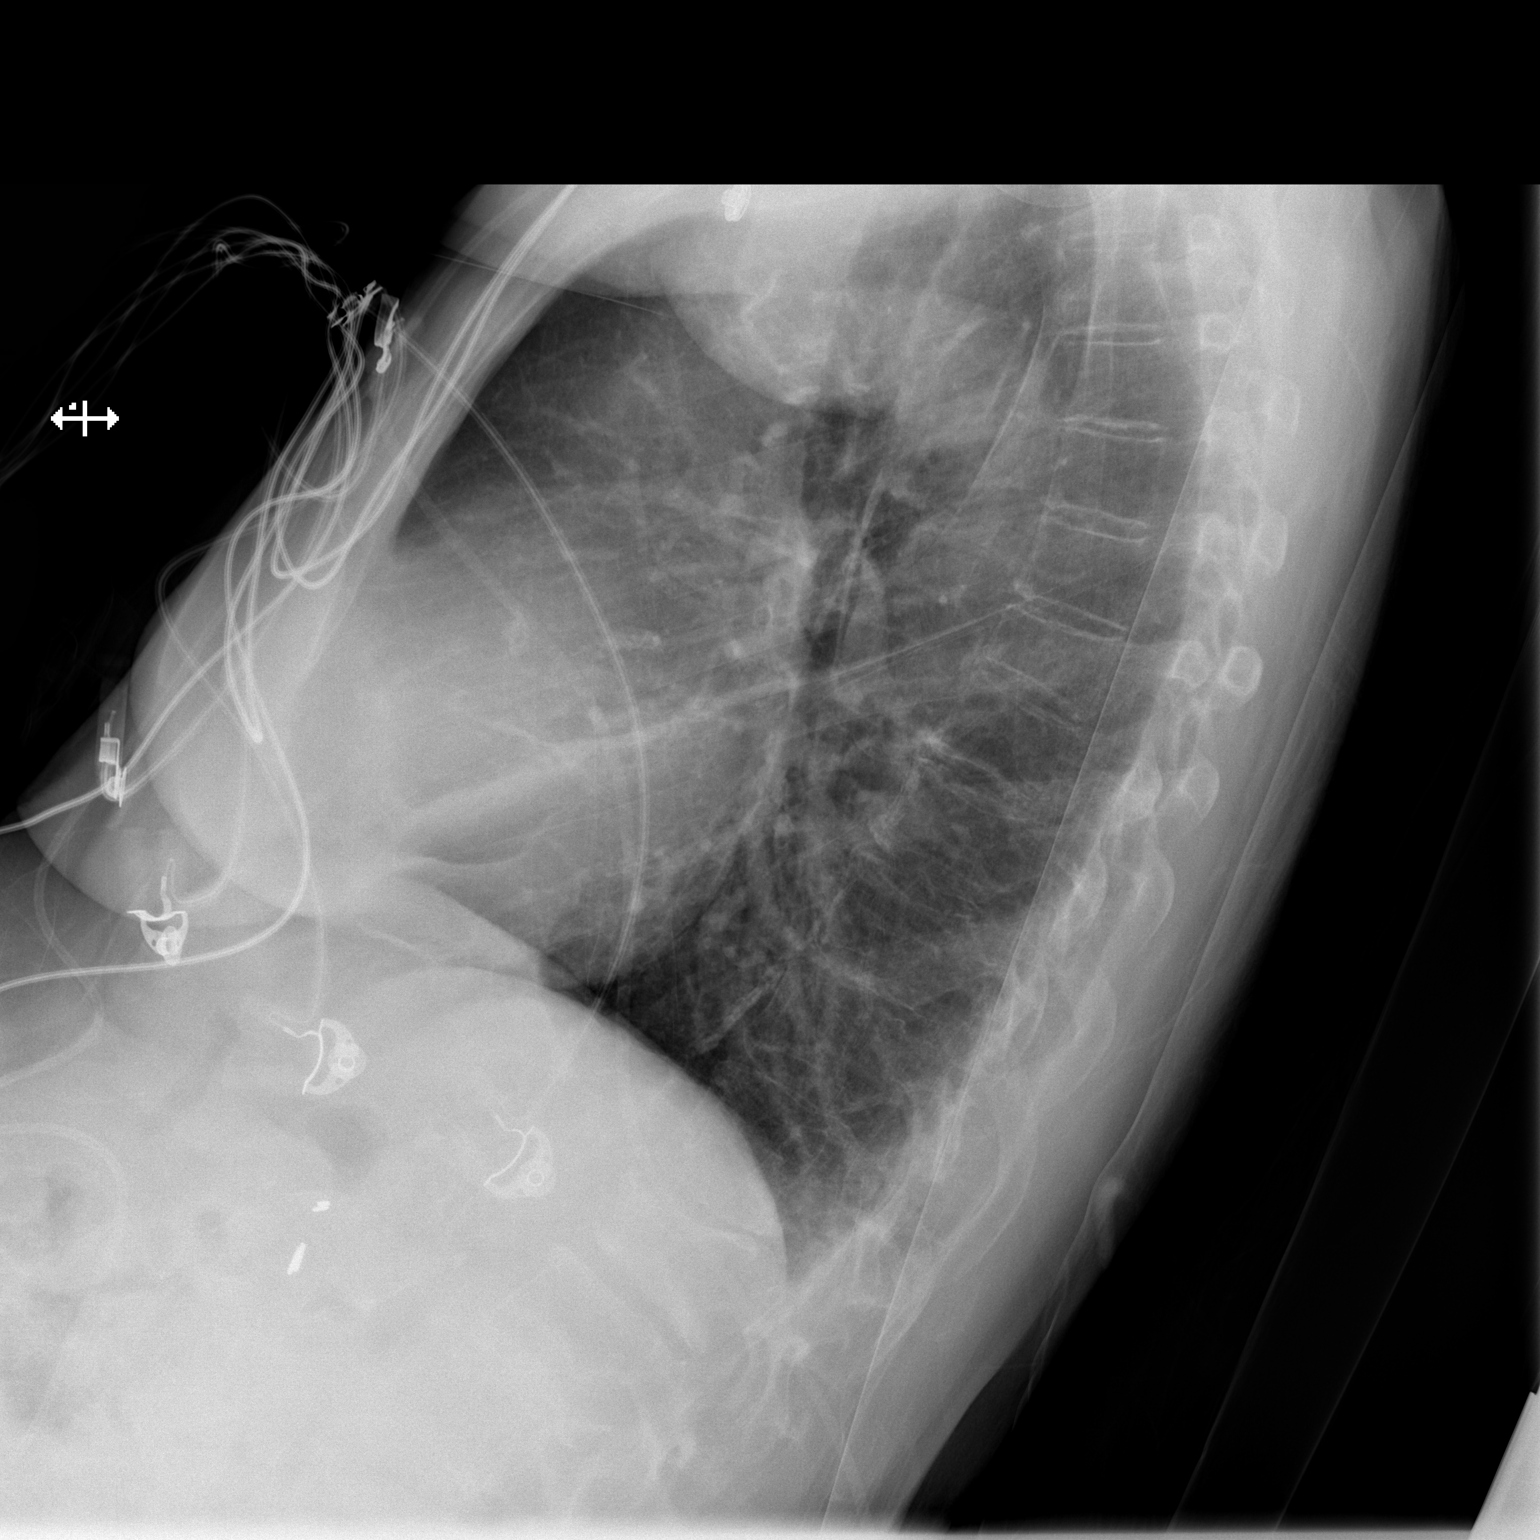

[x chest ap]
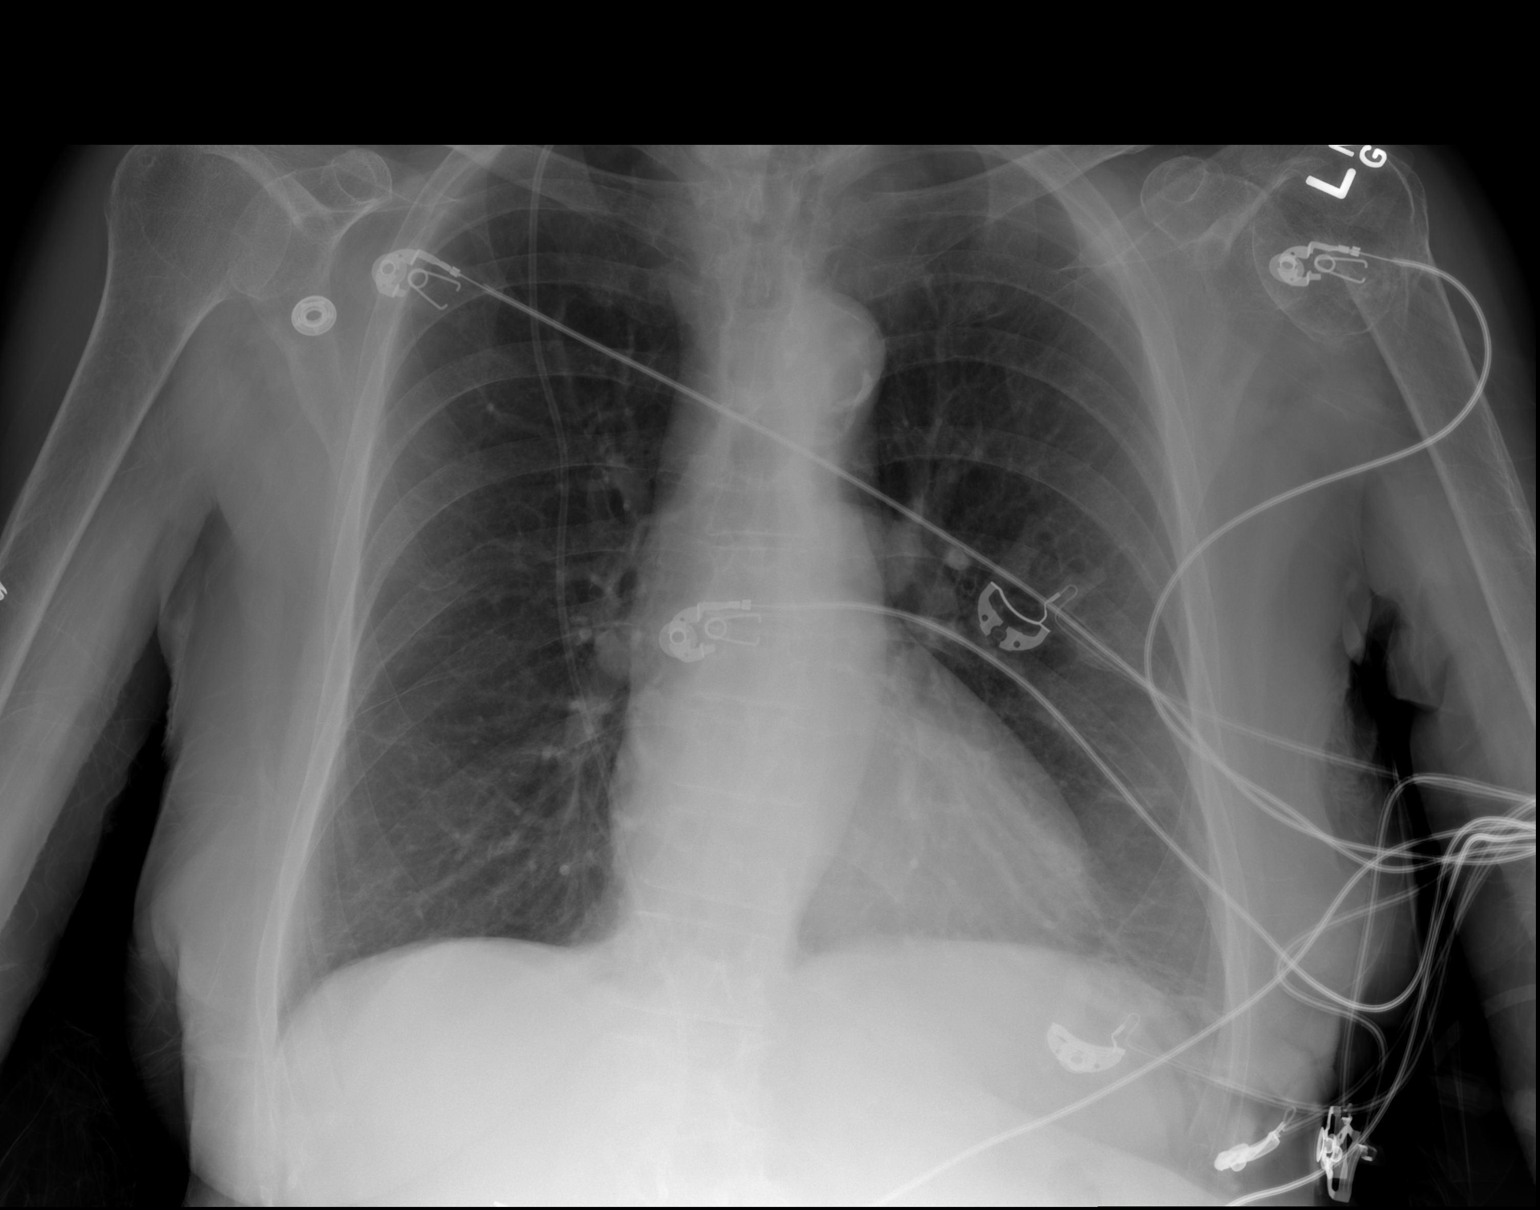

[2 of 2 positions shown; findings below may reference images not displayed]

FINDINGS: The heart size and mediastinal contours are stable with mild aortic
tortuosity. There is stable mild atelectasis or scarring at the left
lung base. The lungs are otherwise clear. Ventriculoperitoneal shunt
catheter overlies the right chest. Superior endplate compression
deformity at L1 appears grossly stable.
IMPRESSION: Stable chest without evidence of acute cardiopulmonary process.

## 2016-10-17 IMAGING — CT CT HEAD W/O CM
1 series · 15 of 30 positions shown, 19 images · non-contrast
Comparison: 12/24/2014 head CT and 12/17/2014 brain MR.

CLINICAL DATA: Hydrocephalus, status post VP shunt last year.
Hypercalcemia. Confusion.

EXAM:
CT HEAD WITHOUT CONTRAST
TECHNIQUE: Contiguous axial images were obtained from the base of the skull
through the vertex without intravenous contrast.

[Series 2: headseq 4.8 h45s · axial · 0.43mm/px · z∈[-156,-18]mm · 15 of 33 slices shown, 19 images]
[im 2/33  brain]
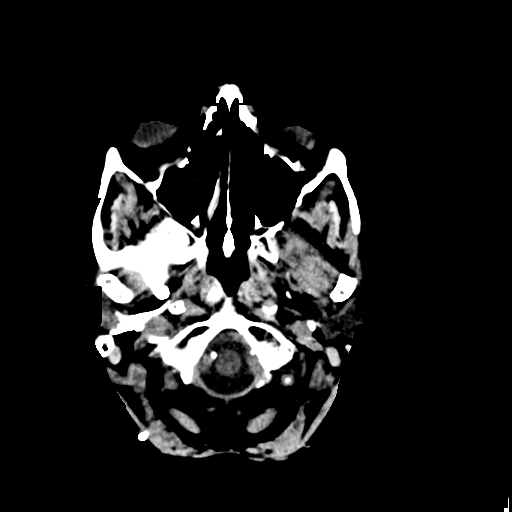
[im 2/33  bone]
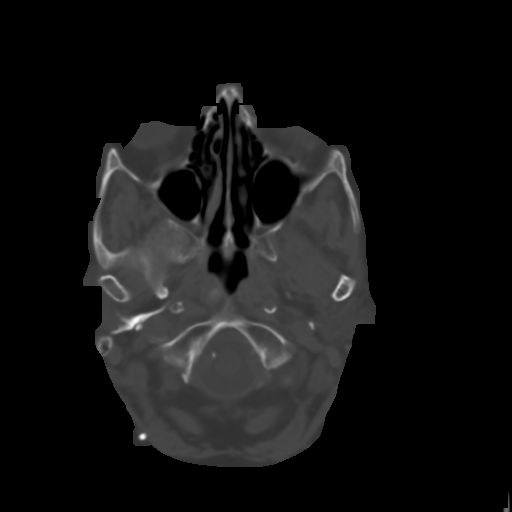
[im 4/33  brain]
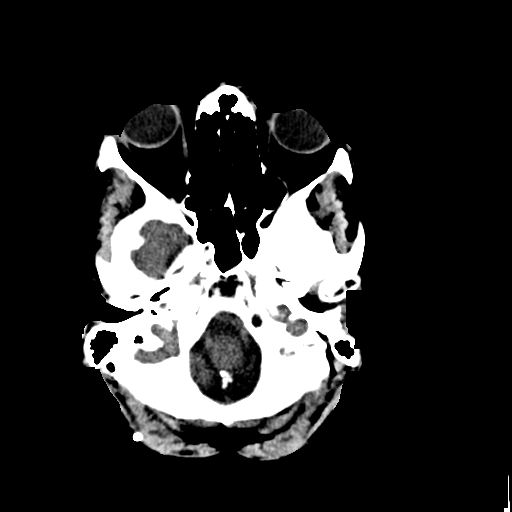
[im 6/33  brain]
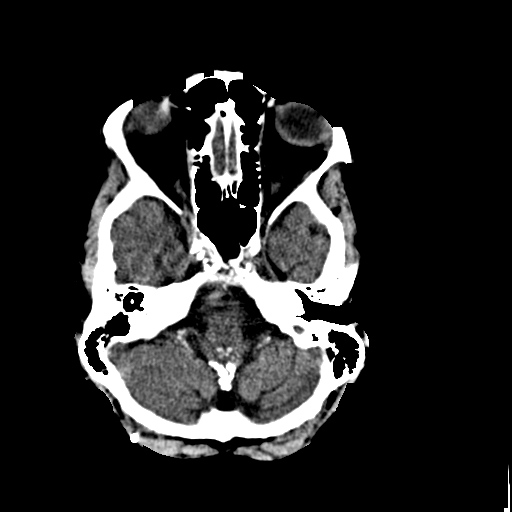
[im 8/33  brain]
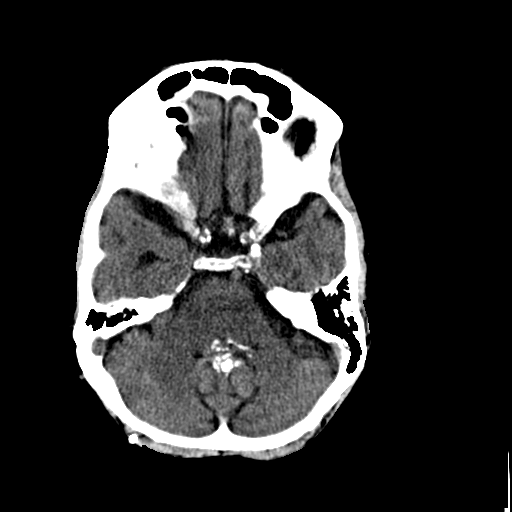
[im 10/33  brain]
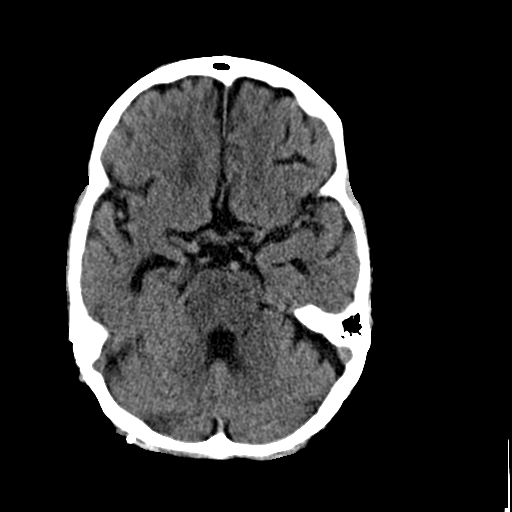
[im 10/33  bone]
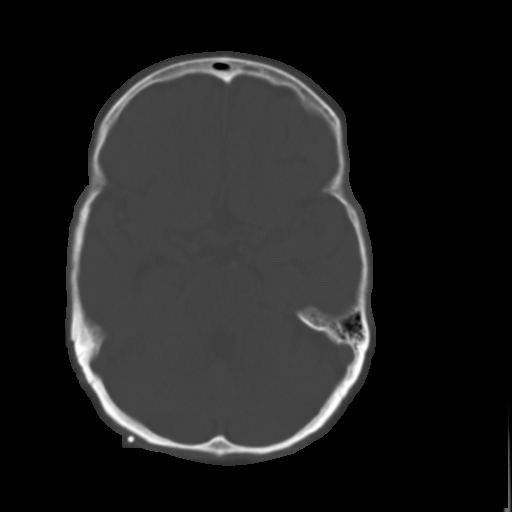
[im 13/33  brain]
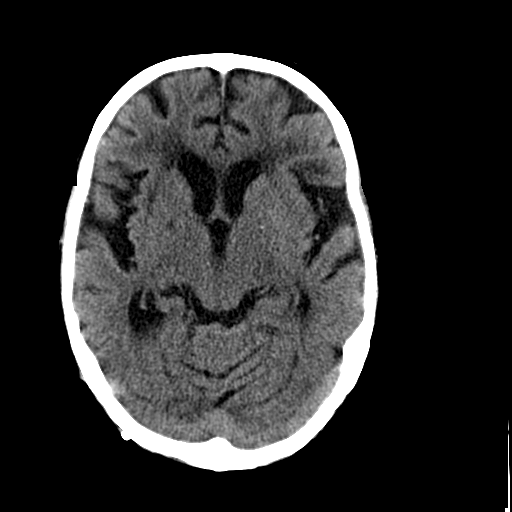
[im 15/33  brain]
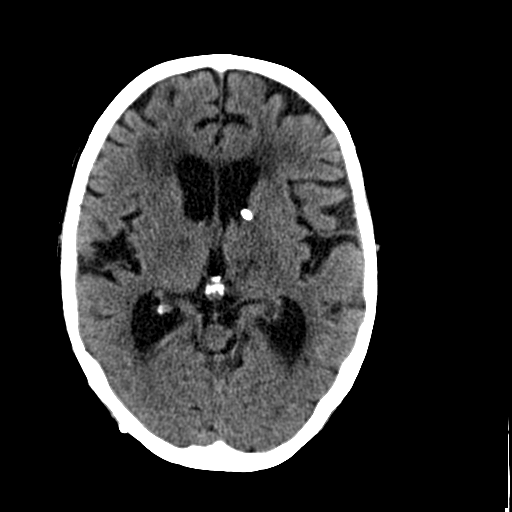
[im 17/33  brain]
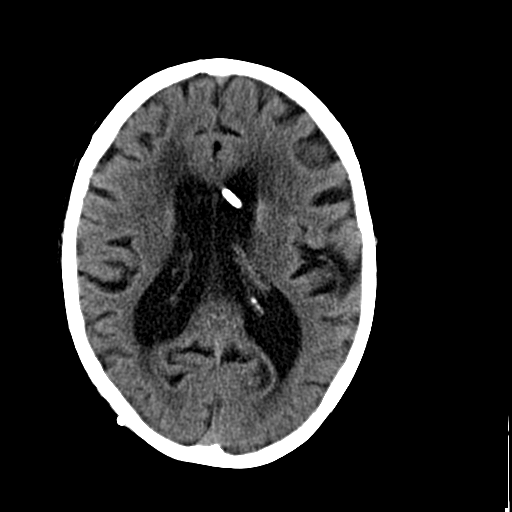
[im 18/33  brain]
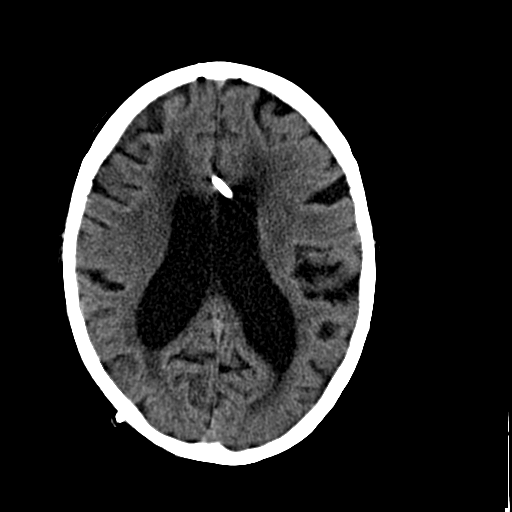
[im 18/33  bone]
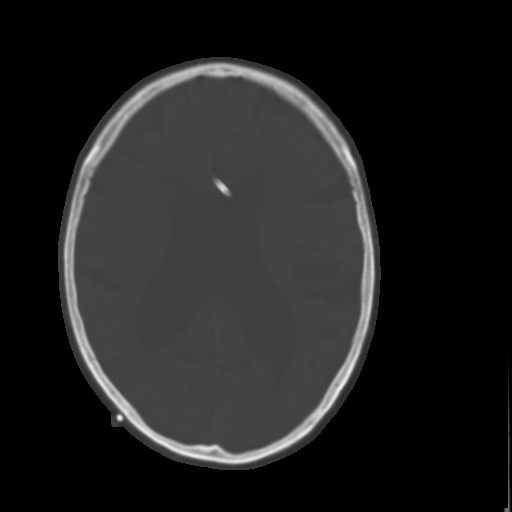
[im 20/33  brain]
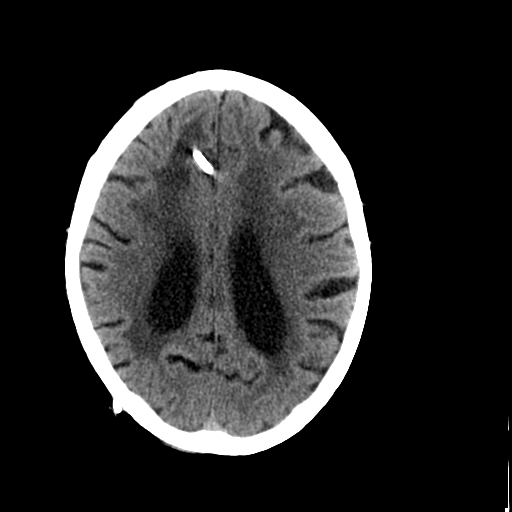
[im 23/33  brain]
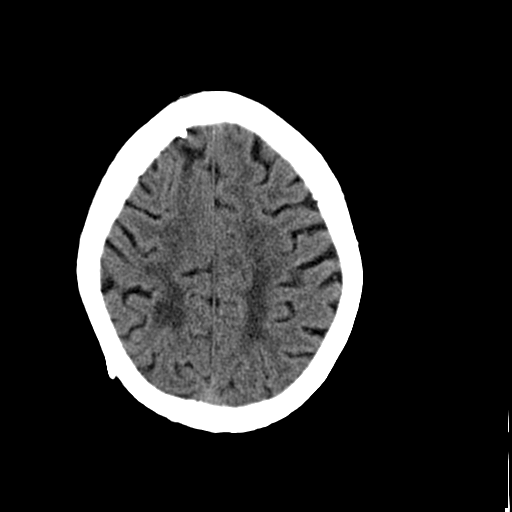
[im 25/33  brain]
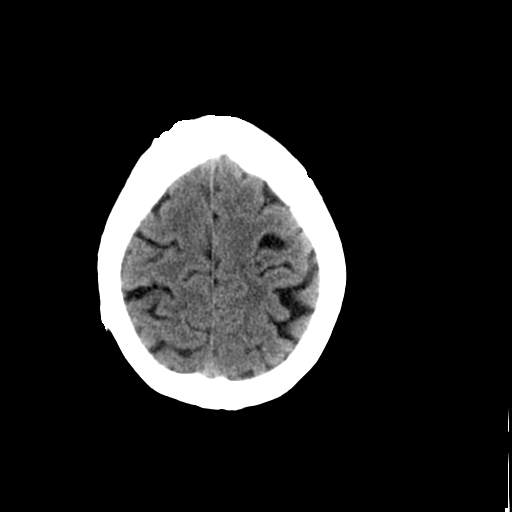
[im 27/33  brain]
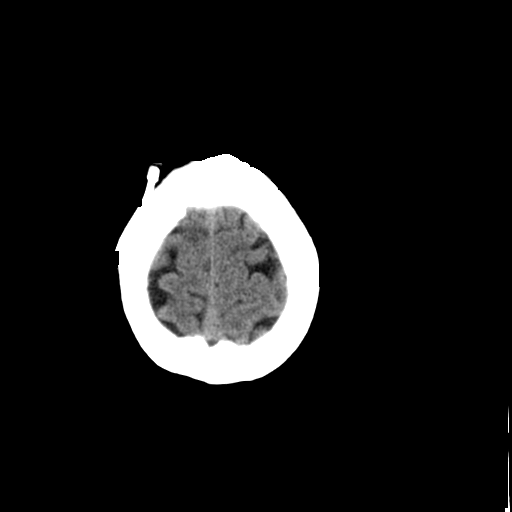
[im 27/33  bone]
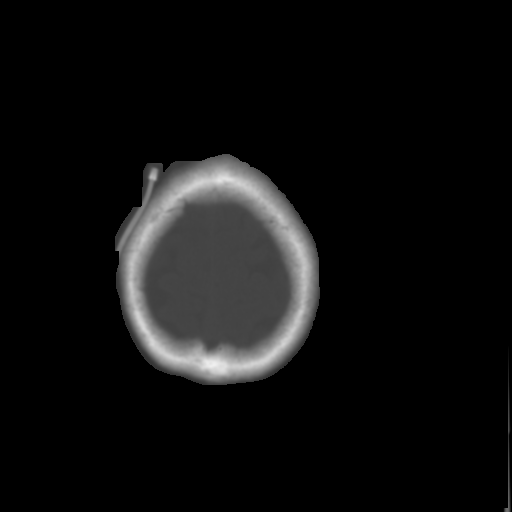
[im 29/33  brain]
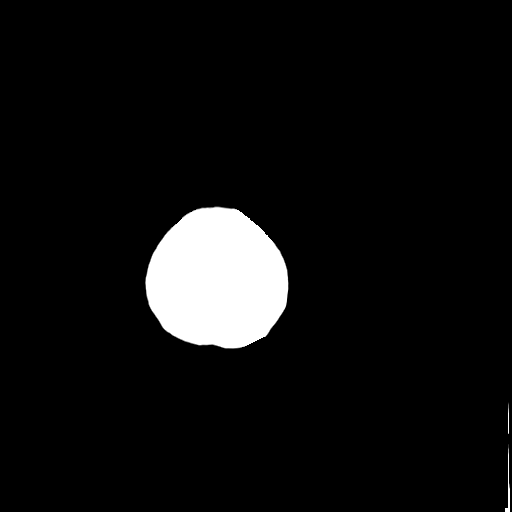
[im 31/33  brain]
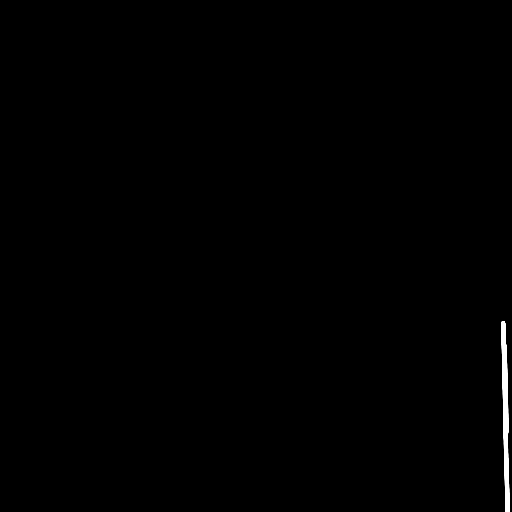

[15 of 30 positions shown; findings below may reference images not displayed]

FINDINGS: Sinuses/Soft tissues: Minimal mucosal thickening of the right
sphenoid sinus. Other paranasal sinuses and mastoid air cells clear.

Intracranial: VP shunt catheter from right frontal approach
terminates in the left lateral ventricle, similar.

Moderate low density in the periventricular white matter likely
related to small vessel disease. Similar mild ventriculomegaly, felt
to be secondary to advanced cerebral and cerebellar atrophy. Remote
lacunar infarct in the left side of the thalamus on image 15. A
smaller remote lacune is suspected in the right thalamus.

Calcified lesion in the fourth ventricle is grossly similar at
cm. No acute infarct, hemorrhage, intra-axial, or extra-axial fluid
collection.
IMPRESSION: 1. No significant change since 12/17/2014.
[DATE]. [DATE] ventricular lesion, as detailed on 12/17/2014 MRI.
3. VP shunt catheter in place with similar mild ventriculomegaly,
felt to be secondary to cerebral atrophy.
4. Moderate small vessel ischemic change.

## 2017-07-23 NOTE — Telephone Encounter (Signed)
This encounter was created in error - please disregard.
